# Patient Record
Sex: Male | Born: 1940
Health system: Southern US, Community
[De-identification: ages and names within clinical notes are randomized; demographics above are authoritative.]

## PROBLEM LIST (undated history)

## (undated) DIAGNOSIS — E7800 Pure hypercholesterolemia, unspecified: Secondary | ICD-10-CM

## (undated) DIAGNOSIS — C801 Malignant (primary) neoplasm, unspecified: Secondary | ICD-10-CM

## (undated) DIAGNOSIS — M1990 Unspecified osteoarthritis, unspecified site: Secondary | ICD-10-CM

## (undated) DIAGNOSIS — I10 Essential (primary) hypertension: Secondary | ICD-10-CM

## (undated) DIAGNOSIS — R002 Palpitations: Secondary | ICD-10-CM

## (undated) DIAGNOSIS — I499 Cardiac arrhythmia, unspecified: Secondary | ICD-10-CM

## (undated) DIAGNOSIS — Z87442 Personal history of urinary calculi: Secondary | ICD-10-CM

## (undated) DIAGNOSIS — G4730 Sleep apnea, unspecified: Secondary | ICD-10-CM

## (undated) DIAGNOSIS — K219 Gastro-esophageal reflux disease without esophagitis: Secondary | ICD-10-CM

## (undated) DIAGNOSIS — Z9289 Personal history of other medical treatment: Secondary | ICD-10-CM

## (undated) HISTORY — PX: EYE SURGERY: SHX253

## (undated) HISTORY — PX: RETINAL DETACHMENT SURGERY: SHX105

## (undated) HISTORY — PX: TONSILLECTOMY: SUR1361

## (undated) HISTORY — PX: MOHS SURGERY: SHX181

## (undated) HISTORY — DX: Gastro-esophageal reflux disease without esophagitis: K21.9

## (undated) HISTORY — DX: Palpitations: R00.2

## (undated) HISTORY — DX: Personal history of other medical treatment: Z92.89

---

## 2005-05-04 ENCOUNTER — Encounter: Admission: RE | Admit: 2005-05-04 | Discharge: 2005-05-04 | Payer: Self-pay | Admitting: Internal Medicine

## 2005-05-09 ENCOUNTER — Encounter: Admission: RE | Admit: 2005-05-09 | Discharge: 2005-05-09 | Payer: Self-pay | Admitting: Internal Medicine

## 2005-06-07 ENCOUNTER — Other Ambulatory Visit: Admission: RE | Admit: 2005-06-07 | Discharge: 2005-06-07 | Payer: Self-pay | Admitting: Interventional Radiology

## 2005-06-07 ENCOUNTER — Encounter: Admission: RE | Admit: 2005-06-07 | Discharge: 2005-06-07 | Payer: Self-pay | Admitting: Internal Medicine

## 2010-04-24 ENCOUNTER — Encounter: Payer: Self-pay | Admitting: Internal Medicine

## 2010-12-03 DIAGNOSIS — Z9289 Personal history of other medical treatment: Secondary | ICD-10-CM

## 2010-12-03 HISTORY — DX: Personal history of other medical treatment: Z92.89

## 2011-06-03 ENCOUNTER — Emergency Department (HOSPITAL_COMMUNITY): Payer: Medicare Other

## 2011-06-03 ENCOUNTER — Encounter (HOSPITAL_COMMUNITY): Payer: Self-pay | Admitting: Family Medicine

## 2011-06-03 ENCOUNTER — Inpatient Hospital Stay (HOSPITAL_COMMUNITY)
Admission: EM | Admit: 2011-06-03 | Discharge: 2011-06-04 | DRG: 552 | Disposition: A | Discharge status: Home / Self Care | Payer: Medicare Other | Attending: Neurosurgery | Admitting: Neurosurgery

## 2011-06-03 DIAGNOSIS — S060XAA Concussion with loss of consciousness status unknown, initial encounter: Secondary | ICD-10-CM | POA: Diagnosis present

## 2011-06-03 DIAGNOSIS — S060X9A Concussion with loss of consciousness of unspecified duration, initial encounter: Secondary | ICD-10-CM | POA: Diagnosis present

## 2011-06-03 DIAGNOSIS — S12100A Unspecified displaced fracture of second cervical vertebra, initial encounter for closed fracture: Principal | ICD-10-CM | POA: Diagnosis present

## 2011-06-03 DIAGNOSIS — Z7982 Long term (current) use of aspirin: Secondary | ICD-10-CM

## 2011-06-03 DIAGNOSIS — W208XXA Other cause of strike by thrown, projected or falling object, initial encounter: Secondary | ICD-10-CM | POA: Diagnosis present

## 2011-06-03 DIAGNOSIS — S0101XA Laceration without foreign body of scalp, initial encounter: Secondary | ICD-10-CM

## 2011-06-03 DIAGNOSIS — S129XXA Fracture of neck, unspecified, initial encounter: Secondary | ICD-10-CM

## 2011-06-03 DIAGNOSIS — S0100XA Unspecified open wound of scalp, initial encounter: Secondary | ICD-10-CM

## 2011-06-03 DIAGNOSIS — S0990XA Unspecified injury of head, initial encounter: Secondary | ICD-10-CM

## 2011-06-03 HISTORY — DX: Unspecified displaced fracture of second cervical vertebra, initial encounter for closed fracture: S12.100A

## 2011-06-03 HISTORY — DX: Pure hypercholesterolemia, unspecified: E78.00

## 2011-06-03 HISTORY — DX: Sleep apnea, unspecified: G47.30

## 2011-06-03 HISTORY — DX: Essential (primary) hypertension: I10

## 2011-06-03 LAB — COMPREHENSIVE METABOLIC PANEL
AST: 30 U/L (ref 0–37)
Albumin: 3.9 g/dL (ref 3.5–5.2)
BUN: 16 mg/dL (ref 6–23)
Chloride: 101 mEq/L (ref 96–112)
GFR calc Af Amer: >90 mL/min (ref >90)
Glucose, Bld: 117 mg/dL — ABNORMAL HIGH (ref 70–99)
Sodium: 139 mEq/L (ref 135–145)

## 2011-06-03 LAB — URINALYSIS, MICROSCOPIC ONLY
Hgb urine dipstick: NEGATIVE
Leukocytes, UA: NEGATIVE
Nitrite: NEGATIVE
Urobilinogen, UA: 1 mg/dL (ref 0.0–1.0)

## 2011-06-03 LAB — POCT I-STAT, CHEM 8
BUN: 20 mg/dL (ref 6–23)
Hemoglobin: 14.3 g/dL (ref 13.0–17.0)
Potassium: 3.1 mEq/L — ABNORMAL LOW (ref 3.5–5.1)
Sodium: 143 mEq/L (ref 135–145)
TCO2: 30 mmol/L (ref 0–100)

## 2011-06-03 LAB — PROTIME-INR
INR: 0.99 (ref 0.00–1.49)
Prothrombin Time: 13.3 seconds (ref 11.6–15.2)

## 2011-06-03 LAB — SAMPLE TO BLOOD BANK

## 2011-06-03 LAB — LACTIC ACID, PLASMA: Lactic Acid, Venous: 2.6 mmol/L — ABNORMAL HIGH (ref 0.5–2.2)

## 2011-06-03 LAB — CBC
HCT: 40.9 % (ref 39.0–52.0)
Hemoglobin: 14.4 g/dL (ref 13.0–17.0)
Platelets: 228 10*3/uL (ref 150–400)
RDW: 13 % (ref 11.5–15.5)
WBC: 16.7 10*3/uL — ABNORMAL HIGH (ref 4.0–10.5)

## 2011-06-03 MED ORDER — ONDANSETRON HCL 4 MG/2ML IJ SOLN
INTRAMUSCULAR | Status: AC
  Administered 2011-06-03: 4 mg via INTRAVENOUS
  Filled 2011-06-03: qty 2

## 2011-06-03 MED ORDER — ONDANSETRON HCL 4 MG/2ML IJ SOLN
4.0000 mg | Freq: Once | INTRAMUSCULAR | Status: AC
  Administered 2011-06-03: 4 mg via INTRAVENOUS
  Filled 2011-06-03: qty 2

## 2011-06-03 MED ORDER — MORPHINE SULFATE 2 MG/ML IJ SOLN: 1.0000 mg | INTRAMUSCULAR | Status: DC | PRN

## 2011-06-03 MED ORDER — HYDROCHLOROTHIAZIDE 25 MG PO TABS
25.0000 mg | ORAL_TABLET | Freq: Every day | ORAL | Status: DC
  Administered 2011-06-04: 25 mg via ORAL
  Filled 2011-06-03 (×2): qty 1

## 2011-06-03 MED ORDER — TETANUS-DIPHTH-ACELL PERTUSSIS 5-2.5-18.5 LF-MCG/0.5 IM SUSP
0.5000 mL | Freq: Once | INTRAMUSCULAR | Status: AC
  Administered 2011-06-03: 0.5 mL via INTRAMUSCULAR
  Filled 2011-06-03: qty 0.5

## 2011-06-03 MED ORDER — BACITRACIN ZINC 500 UNIT/GM EX OINT
TOPICAL_OINTMENT | Freq: Two times a day (BID) | CUTANEOUS | Status: DC
  Administered 2011-06-03 – 2011-06-04 (×2): via TOPICAL
  Filled 2011-06-03 (×2): qty 15

## 2011-06-03 MED ORDER — IOHEXOL 350 MG/ML SOLN
50.0000 mL | Freq: Once | INTRAVENOUS | Status: AC | PRN
  Administered 2011-06-03: 50 mL via INTRAVENOUS

## 2011-06-03 MED ORDER — OXYCODONE HCL 5 MG PO TABS: 2.5000 mg | ORAL_TABLET | ORAL | Status: DC | PRN

## 2011-06-03 MED ORDER — POTASSIUM CHLORIDE IN NACL 20-0.9 MEQ/L-% IV SOLN
INTRAVENOUS | Status: DC
  Administered 2011-06-03: 21:00:00 via INTRAVENOUS
  Filled 2011-06-03 (×2): qty 1000

## 2011-06-03 MED ORDER — SODIUM CHLORIDE 0.9 % IV BOLUS (SEPSIS)
1000.0000 mL | Freq: Once | INTRAVENOUS | Status: AC
  Administered 2011-06-03: 1000 mL via INTRAVENOUS

## 2011-06-03 MED ORDER — ONDANSETRON HCL 4 MG PO TABS: 4.0000 mg | ORAL_TABLET | Freq: Four times a day (QID) | ORAL | Status: DC | PRN

## 2011-06-03 MED ORDER — ASPIRIN 81 MG PO CHEW
81.0000 mg | CHEWABLE_TABLET | Freq: Every day | ORAL | Status: DC
  Administered 2011-06-04: 81 mg via ORAL
  Filled 2011-06-03 (×2): qty 1

## 2011-06-03 MED ORDER — NEBIVOLOL HCL 5 MG PO TABS
5.0000 mg | ORAL_TABLET | Freq: Every day | ORAL | Status: DC
  Administered 2011-06-04: 5 mg via ORAL
  Filled 2011-06-03 (×2): qty 1

## 2011-06-03 MED ORDER — ONDANSETRON HCL 4 MG/2ML IJ SOLN: 4.0000 mg | Freq: Four times a day (QID) | INTRAMUSCULAR | Status: DC | PRN

## 2011-06-03 MED ORDER — FENTANYL CITRATE 0.05 MG/ML IJ SOLN
50.0000 ug | Freq: Once | INTRAMUSCULAR | Status: DC
  Filled 2011-06-03: qty 2

## 2011-06-03 MED ORDER — VITAMIN D (ERGOCALCIFEROL) 1.25 MG (50000 UNIT) PO CAPS
50000.0000 [IU] | ORAL_CAPSULE | ORAL | Status: DC
  Filled 2011-06-03: qty 1

## 2011-06-03 MED ORDER — LISINOPRIL 40 MG PO TABS
40.0000 mg | ORAL_TABLET | Freq: Every day | ORAL | Status: DC
  Administered 2011-06-04: 40 mg via ORAL
  Filled 2011-06-03 (×2): qty 1

## 2011-06-03 MED ORDER — CEFAZOLIN SODIUM 1-5 GM-% IV SOLN
INTRAVENOUS | Status: AC
  Filled 2011-06-03: qty 50

## 2011-06-03 MED ORDER — CEFAZOLIN SODIUM 1-5 GM-% IV SOLN
1.0000 g | Freq: Three times a day (TID) | INTRAVENOUS | Status: DC
  Administered 2011-06-04 (×2): 1 g via INTRAVENOUS
  Filled 2011-06-03 (×3): qty 50

## 2011-06-03 MED ORDER — HYDROCODONE-ACETAMINOPHEN 5-325 MG PO TABS: 2.0000 | ORAL_TABLET | ORAL | Status: DC | PRN

## 2011-06-03 MED ORDER — CEFAZOLIN SODIUM 1-5 GM-% IV SOLN
1.0000 g | Freq: Once | INTRAVENOUS | Status: AC
  Administered 2011-06-03: 1 g via INTRAVENOUS

## 2011-06-03 MED ORDER — SIMVASTATIN 20 MG PO TABS
20.0000 mg | ORAL_TABLET | Freq: Every evening | ORAL | Status: DC
  Administered 2011-06-03: 20 mg via ORAL
  Filled 2011-06-03 (×2): qty 1

## 2011-06-03 MED ORDER — AMLODIPINE BESYLATE 10 MG PO TABS
10.0000 mg | ORAL_TABLET | Freq: Every day | ORAL | Status: DC
  Administered 2011-06-04: 10 mg via ORAL
  Filled 2011-06-03 (×2): qty 1
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
## 2011-06-03 NOTE — ED Notes (Signed)
Pt contact Ronaldo Miyamoto [phone] son  Amada Jupiter- [phone]
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
## 2011-06-03 NOTE — Op Note (Signed)
NAMEMarland Kitchen  Palmer, Mario NO.:  [id number]  MEDICAL RECORD NO.:  [id number]  LOCATION:  3032                         FACILITY:  [facility]  PHYSICIAN:  Abigail Miyamoto, M.D. DATE OF BIRTH:  1940-11-15  DATE OF PROCEDURE:  06/03/2011 DATE OF DISCHARGE:                              OPERATIVE REPORT   PREOPERATIVE DIAGNOSIS:  A 12-cm left scalp laceration, 6-cm right scalp laceration.  POSTOPERATIVE DIAGNOSIS:  A 12-cm left scalp laceration, 6-cm right scalp laceration.  PROCEDURE:  Complex repair to bilateral scalp laceration.  SURGEON:  Abigail Miyamoto, M.D.  ANESTHESIA:  Lidocaine 1% with epinephrine.  ESTIMATED BLOOD LOSS:  Minimal.  INDICATIONS:  This is a 71 year old gentleman who was hit on the head by a tree.  He has large scalp lacerations on both sides.  He also has a C2 fracture.  The decision was made by Neurosurgery to conservatively manage the C-spine fracture, so decision was made to repair the scalp lacerations in the emergency department.  PROCEDURE IN DETAIL:  The patient's head was prepped and draped in usual sterile fashion.  I excised some of the hair around both lacerations with the scissors.  I then irrigated both wounds completely with normal saline and cleaned them vigorously with Betadine.  I then closed the subcutaneous tissue of both incisions with interrupted 3-0 Vicryl sutures.  I then closed both incisions with skin staples.  The laceration on the left side did go on to the forehead slightly and repaired this with 3-0 and 4-0 Prolene sutures.  The patient tolerated the procedure well.  Towels and tape were then applied.     Abigail Miyamoto, M.D.     DB/MEDQ  D:  06/03/2011  T:  06/03/2011  Job:  [id number]

## 2011-06-03 NOTE — H&P (Signed)
Mario Palmer is an 71 y.o. male.   Chief Complaint: C2 fracture HPI: 71 yo gentleman whom while cutting trees on his property had a tree limb hit him on the head. He walked on his own until his son caught up with him. His son put him in his truck and took him closer to his home. EMS then arrived placed him on A back board and cervical collar. Transported to [facility] ER. Normal neurologic exam at the hospital. Head CT normal, cspine CT showed a C2 fracture. Cervical alignment maintained. Trauma to see also.  Past Medical History  Diagnosis Date  . Hypertension   . High cholesterol   . Sleep apnea     History reviewed. No pertinent past surgical history.  History reviewed. No pertinent family history. Social History:  does not have a smoking history on file. He does not have any smokeless tobacco history on file. He reports that he does not drink alcohol or use illicit drugs.  Allergies: No Known Allergies  Medications Prior to Admission  Medication Dose Route Frequency Provider Last Rate Last Dose  . ceFAZolin (ANCEF) IVPB 1 g/50 mL premix  1 g Intravenous Once Shelly Rubenstein, MD   1 g at 06/03/11 1848  . fentaNYL (SUBLIMAZE) injection 50 mcg  50 mcg Intravenous Once Amy Coker, MD      . ondansetron (ZOFRAN) injection 4 mg  4 mg Intravenous Once Particia Lather, MD   4 mg at 06/03/11 1814  . sodium chloride 0.9 % bolus 1,000 mL  1,000 mL Intravenous Once Particia Lather, MD   1,000 mL at 06/03/11 1811  . TDaP (BOOSTRIX) injection 0.5 mL  0.5 mL Intramuscular Once Particia Lather, MD   0.5 mL at 06/03/11 1802   No current outpatient prescriptions on file as of 06/03/2011.    Results for orders placed during the hospital encounter of 06/03/11 (from the past 48 hour(s))  COMPREHENSIVE METABOLIC PANEL     Status: Abnormal   Collection Time   06/03/11  4:27 PM      Component Value Range Comment   Sodium 139  135 - 145 (mEq/L)    Potassium 3.5  3.5 - 5.1 (mEq/L)    Chloride 101  96 - 112 (mEq/L)    CO2  24  19 - 32 (mEq/L)    Glucose, Bld 117 (*) 70 - 99 (mg/dL)    BUN 16  6 - 23 (mg/dL)    Creatinine, Ser 1.61  0.50 - 1.35 (mg/dL)    Calcium 9.4  8.4 - 10.5 (mg/dL)    Total Protein 6.9  6.0 - 8.3 (g/dL)    Albumin 3.9  3.5 - 5.2 (g/dL)    AST 30  0 - 37 (U/L) HEMOLYSIS AT THIS LEVEL MAY AFFECT RESULT   ALT 23  0 - 53 (U/L)    Alkaline Phosphatase 59  39 - 117 (U/L)    Total Bilirubin 0.8  0.3 - 1.2 (mg/dL)    GFR calc non Af Amer 88 (*) >90 (mL/min)    GFR calc Af Amer >90  >90 (mL/min)   CBC     Status: Abnormal   Collection Time   06/03/11  4:27 PM      Component Value Range Comment   WBC 16.7 (*) 4.0 - 10.5 (K/uL)    RBC 4.68  4.22 - 5.81 (MIL/uL)    Hemoglobin 14.4  13.0 - 17.0 (g/dL)    HCT 09.6  04.5 - 40.9 (%)  MCV 87.4  78.0 - 100.0 (fL)    MCH 30.8  26.0 - 34.0 (pg)    MCHC 35.2  30.0 - 36.0 (g/dL)    RDW 16.1  09.6 - 04.5 (%)    Platelets 228  150 - 400 (K/uL)   PROTIME-INR     Status: Normal   Collection Time   06/03/11  4:27 PM      Component Value Range Comment   Prothrombin Time 13.3  11.6 - 15.2 (seconds)    INR 0.99  0.00 - 1.49    LACTIC ACID, PLASMA     Status: Abnormal   Collection Time   06/03/11  4:28 PM      Component Value Range Comment   Lactic Acid, Venous 2.6 (*) 0.5 - 2.2 (mmol/L)   SAMPLE TO BLOOD BANK     Status: Normal   Collection Time   06/03/11  4:28 PM      Component Value Range Comment   Blood Bank Specimen SAMPLE AVAILABLE FOR TESTING      Sample Expiration 06/04/2011     POCT I-STAT, CHEM 8     Status: Abnormal   Collection Time   06/03/11  4:46 PM      Component Value Range Comment   Sodium 143  135 - 145 (mEq/L)    Potassium 3.1 (*) 3.5 - 5.1 (mEq/L)    Chloride 103  96 - 112 (mEq/L)    BUN 20  6 - 23 (mg/dL)    Creatinine, Ser 4.09  0.50 - 1.35 (mg/dL)    Glucose, Bld 811 (*) 70 - 99 (mg/dL)    Calcium, Ion 9.14  1.12 - 1.32 (mmol/L)    TCO2 30  0 - 100 (mmol/L)    Hemoglobin 14.3  13.0 - 17.0 (g/dL)    HCT 78.2  95.6 - 21.3  (%)   URINALYSIS, WITH MICROSCOPIC     Status: Abnormal   Collection Time   06/03/11  6:16 PM      Component Value Range Comment   Color, Urine YELLOW  YELLOW     APPearance CLEAR  CLEAR     Specific Gravity, Urine 1.013  1.005 - 1.030     pH 7.5  5.0 - 8.0     Glucose, UA NEGATIVE  NEGATIVE (mg/dL)    Hgb urine dipstick NEGATIVE  NEGATIVE     Bilirubin Urine NEGATIVE  NEGATIVE     Ketones, ur 15 (*) NEGATIVE (mg/dL)    Protein, ur NEGATIVE  NEGATIVE (mg/dL)    Urobilinogen, UA 1.0  0.0 - 1.0 (mg/dL)    Nitrite NEGATIVE  NEGATIVE     Leukocytes, UA NEGATIVE  NEGATIVE     Squamous Epithelial / LPF RARE  RARE     Dg Thoracic Spine 2 View  06/03/2011  *RADIOLOGY REPORT*  Clinical Data: A tree limb fell on patient, landed on head. Lacerations of head.  Pain in the head and neck.  THORACIC SPINE - 2 VIEW  Comparison: Chest x-ray 06/03/2011  Findings: There is normal alignment of the thoracic spine.  No evidence for acute fracture or subluxation.  Visualized portion of the sternum appears intact. No evidence for acute rib fracture.  IMPRESSION: Normal alignment of the thoracic spine.  Original Report Authenticated By: Patterson Hammersmith, M.D.   Dg Lumbar Spine 2-3 Views  06/03/2011  *RADIOLOGY REPORT*  Clinical Data: A tree limb fell on the patient's head. Lacerations.  Pain in head and neck.  Cervical  spine fracture.  LUMBAR SPINE - 2-3 VIEW  Comparison: Chest x-ray 06/03/2011  Findings: There is normal alignment of the spine.  No evidence for acute fracture or subluxation.  Mild degenerative changes are present.  IMPRESSION: No evidence for acute  abnormality.  Original Report Authenticated By: Patterson Hammersmith, M.D.   Ct Head Wo Contrast  06/03/2011  *RADIOLOGY REPORT*  Clinical Data:  Tree fell on head.  Laceration on the top of the head.  Neck pain.  CT HEAD WITHOUT CONTRAST CT CERVICAL SPINE WITHOUT CONTRAST  Technique:  Multidetector CT imaging of the head and cervical spine was performed  following the standard protocol without intravenous contrast.  Multiplanar CT image reconstructions of the cervical spine were also generated.  Comparison:   None  CT HEAD  Findings: There is a large scalp laceration extending from the left frontal region to involve the left parietal region.  Extensive scalp edema identified.  No evidence for calvarial fracture.  There is no intra or extra-axial fluid collection or mass lesion. The basilar cisterns and ventricles have a normal appearance. There is no CT evidence for acute infarction or hemorrhage.  IMPRESSION:  1. No evidence for acute intracranial abnormality. 2.  Large left scalp laceration out underlying calvarial fracture.  CT CERVICAL SPINE  Findings: There is a comminuted fracture of T2.  Fracture involves the posterior aspect of the dens and extends vertically to involve the lamina on the right side and the pedicle on the left side.  The vertebral foramina are involved bilaterally at C2.  Fracture is considered unstable.  The spinous process of C2 is posteriorly displaced compared to C3.  There is little soft tissue edema. There are degenerative changes throughout the cervical spine.  No other fractures are identified however.  Lung apices are unremarkable.  IMPRESSION:  1.  Comminuted, unstable fracture of C2. 2.  Significant degenerative changes elsewhere in the cervical spine.  No other fractures.  Critical test results telephoned to [facility] at the time of interpretation on date 06/03/2011 at time 5:21 p.m.  Original Report Authenticated By: Patterson Hammersmith, M.D.   Ct Cervical Spine Wo Contrast  06/03/2011  *RADIOLOGY REPORT*  Clinical Data:  Tree fell on head.  Laceration on the top of the head.  Neck pain.  CT HEAD WITHOUT CONTRAST CT CERVICAL SPINE WITHOUT CONTRAST  Technique:  Multidetector CT imaging of the head and cervical spine was performed following the standard protocol without intravenous contrast.  Multiplanar CT image reconstructions  of the cervical spine were also generated.  Comparison:   None  CT HEAD  Findings: There is a large scalp laceration extending from the left frontal region to involve the left parietal region.  Extensive scalp edema identified.  No evidence for calvarial fracture.  There is no intra or extra-axial fluid collection or mass lesion. The basilar cisterns and ventricles have a normal appearance. There is no CT evidence for acute infarction or hemorrhage.  IMPRESSION:  1. No evidence for acute intracranial abnormality. 2.  Large left scalp laceration out underlying calvarial fracture.  CT CERVICAL SPINE  Findings: There is a comminuted fracture of T2.  Fracture involves the posterior aspect of the dens and extends vertically to involve the lamina on the right side and the pedicle on the left side.  The vertebral foramina are involved bilaterally at C2.  Fracture is considered unstable.  The spinous process of C2 is posteriorly displaced compared to C3.  There is little soft tissue  edema. There are degenerative changes throughout the cervical spine.  No other fractures are identified however.  Lung apices are unremarkable.  IMPRESSION:  1.  Comminuted, unstable fracture of C2. 2.  Significant degenerative changes elsewhere in the cervical spine.  No other fractures.  Critical test results telephoned to [facility] at the time of interpretation on date 06/03/2011 at time 5:21 p.m.  Original Report Authenticated By: Patterson Hammersmith, M.D.   Dg Chest Port 1 View  06/03/2011  *RADIOLOGY REPORT*  Clinical Data: Tree fell on head.  Laceration to head  PORTABLE CHEST - 1 VIEW  Comparison: 05/04/2005  Findings: Cardiac enlargement.  Vascularity is normal.  Lungs are clear without infiltrate or effusion.  No acute bony abnormality.  IMPRESSION: Cardiac enlargement.  No acute cardiopulmonary disease.  Original Report Authenticated By: Camelia Phenes, M.D.   Dg Hand Complete Left  06/03/2011  *RADIOLOGY REPORT*  Clinical Data:  Injury.  Lacerations of the hand.  LEFT HAND - COMPLETE 3+ VIEW  Comparison: None.  Findings: There is no evidence for acute fracture or dislocation. No soft tissue foreign body or gas identified.  There is soft tissue swelling along the dorsum of the hand at the mid carpal phalangeal joints.  IMPRESSION:  1.  Soft tissue swelling. 2. No evidence for acute osseous abnormality.  Original Report Authenticated By: Patterson Hammersmith, M.D.    Review of Systems  Constitutional: Negative.   HENT: Positive for neck pain.   Eyes: Negative.   Respiratory:       Sleep apnea  Cardiovascular: Negative.   Gastrointestinal: Negative.   Genitourinary: Negative.   Skin: Negative.   Neurological: Negative.   Endo/Heme/Allergies: Negative.   Psychiatric/Behavioral: Negative.     Blood pressure 108/63, pulse 75, temperature 97.7 F (36.5 C), resp. rate 18, SpO2 99.00%. Physical Exam  Constitutional: He is oriented to person, place, and time. He appears well-developed and well-nourished.  HENT:  Head: Normocephalic. Head is with laceration. Head is without raccoon's eyes, without Battle's sign, without right periorbital erythema and without left periorbital erythema.    Right Ear: Tympanic membrane, external ear and ear canal normal. No hemotympanum.  Left Ear: Tympanic membrane, external ear and ear canal normal.  Nose: Nose normal.  Mouth/Throat: Uvula is midline and oropharynx is clear and moist. Mucous membranes are not pale, dry and not cyanotic.         Two scalp lacerations. Repaired primarily.  Neck: Trachea normal and phonation normal. Normal carotid pulses present. Carotid bruit is not present. No tracheal deviation present.  GI: Soft. Normal appearance and bowel sounds are normal.  Musculoskeletal: Normal range of motion.       Left hand: He exhibits laceration.  Neurological: He is alert and oriented to person, place, and time. He has normal strength and normal reflexes. He displays no  atrophy. No cranial nerve deficit or sensory deficit. He exhibits normal muscle tone. Coordination normal. GCS eye subscore is 4. GCS verbal subscore is 5. GCS motor subscore is 6. He displays no Babinski's sign on the right side. He displays no Babinski's sign on the left side.  Reflex Scores:      Tricep reflexes are 2+ on the right side and 2+ on the left side.      Bicep reflexes are 2+ on the right side and 2+ on the left side.      Brachioradialis reflexes are 2+ on the right side and 2+ on the left side.  Patellar reflexes are 2+ on the right side and 2+ on the left side.      Achilles reflexes are 2+ on the right side and 2+ on the left side.      Amnestic for some parts of the event.     Assessment/Plan 71 yo with concussion now completely resolved, and C2 fracture with good cervical alignment. Will need to stay in [employer].  Admit overnight. Undergoing Ct angio to assess vertebral artery. No evidence on exam of any problem in posterior cerebral territory. Admit to floor. Doing well  Cassady Turano L 06/03/2011, 7:17 PM

## 2011-06-03 NOTE — ED Provider Notes (Signed)
6:21 PM  I performed a history and physical examination of Raju Coppolino Bonafede and discussed his management with Dr. Lendell Caprice.  I agree with the history, physical, assessment, and plan of care, with the following exceptions: None  This patient was seen by me along with Dr. Lendell Caprice on initial evaluation in the ED. The patient apparently had a large branch fall from a tree landing on his head. He has a significant laceration to the scalp, and a headache with neck pain. He is neurologically intact distal to his injuries, with movement, sensation, and strength appropriate in all 4 extremities. He denies any other injuries.  The patient was found to have an unstable, comminuted C2 vertebral fracture on CT scan. There is no evidence of closed head injury. Dr. Lendell Caprice has communicated the details of the case to Dr. Mikal Plane of neurosurgery on call. Dr. Mikal Plane advises that he perceives no surgical intervention to be needed, and that the patient should be discharged home in a cervical spine collar. Dr. Mikal Plane is coming to evaluate the patient. If the patient is discharged home, it will be done by Dr. Mikal Plane after his evaluation. Trauma surgery, Dr. Magnus Ivan was contacted after the conversation with Dr. Mikal Plane and informed that Dr. Mikal Plane would not be admitting the patient and recommended discharge home. Dr. Magnus Ivan states that this should be neurosurgery admission if it needs admission, and that Dr. Mikal Plane advises discharge that they do not see a reason to admit the patient otherwise.  I was present for the following procedures: None Time Spent in Critical Care of the patient: None Time spent in discussions with the patient and family: 15 minutes  Merlyn Bollen D    Felisa Bonier, MD 06/03/11 1825

## 2011-06-03 NOTE — ED Notes (Signed)
Pt transported to CT ?

## 2011-06-03 NOTE — Progress Notes (Signed)
Patient Mario Palmer, 71 year old [place] male arrived via EMS at the ED trauma room 17 in late afternoon.  Patient is undergoing several tests and CT scans.  Patient's family expressed appreciation for Chaplain's provision of pastoral presence and conversation.  I will follow-up as needed.

## 2011-06-04 ENCOUNTER — Telehealth (HOSPITAL_COMMUNITY): Payer: Self-pay

## 2011-06-04 MED ORDER — TRAMADOL HCL 50 MG PO TABS: 50.0000 mg | ORAL_TABLET | Freq: Four times a day (QID) | ORAL | Status: AC | PRN

## 2011-06-04 MED ORDER — BACITRACIN ZINC 500 UNIT/GM EX OINT: TOPICAL_OINTMENT | Freq: Two times a day (BID) | CUTANEOUS | Status: AC

## 2011-06-04 MED ORDER — WHITE PETROLATUM GEL
Status: AC
  Administered 2011-06-04
  Filled 2011-06-04: qty 5

## 2011-06-04 NOTE — Progress Notes (Signed)
Staple removal per Neurosurgery at follow-up, per patient.   Being discharged today by Neurosurgery.  Wilmon Arms. Corliss Skains, MD, [facility] Surgery  06/04/2011 11:55 AM

## 2011-06-04 NOTE — Discharge Summary (Signed)
Physician Discharge Summary  Patient ID: Mario Palmer MRN: [id number] DOB/AGE: 1940-04-07 71 y.o.  Admit date: 06/03/2011 Discharge date: 06/04/2011  Admission Diagnoses:C2 fracture  Discharge Diagnoses:  Principal Problem:  *Traumatic closed fracture of C2 vertebra with minimal displacement   Discharged Condition: good  Hospital Course: admitted for observation. [name] sustained a C2 fracture and deep scalp lacerations after being struck by a tree limb. He had no neurologic deficits on motor exam. He did have a concussion. Head CT was normal.   Consults: general surgery  Significant Diagnostic Studies: angiography: CT of Cspine and vertebral arteries showed no arterial injuries. There is a mass in the vallecula which I will refer him to an ENT physician for evaluation.  Treatments: Scalp lacerations repaired primarily by Trauma service.  Discharge Exam: Blood pressure 122/70, pulse 78, temperature 99 F (37.2 C), temperature source Oral, resp. rate 18, SpO2 97.00%. normal exam.  Disposition: Final discharge disposition not confirmed   Medication List  As of 06/04/2011 10:11 AM   TAKE these medications         amLODipine 10 MG tablet   Commonly known as: NORVASC   Take 10 mg by mouth daily.      aspirin 81 MG chewable tablet   Chew 81 mg by mouth daily.      bacitracin ointment   Apply topically 2 (two) times daily. To hand      hydrochlorothiazide 25 MG tablet   Commonly known as: HYDRODIURIL   Take 25 mg by mouth daily.      lisinopril 40 MG tablet   Commonly known as: PRINIVIL,ZESTRIL   Take 40 mg by mouth daily.      nebivolol 5 MG tablet   Commonly known as: BYSTOLIC   Take 5 mg by mouth daily.      simvastatin 20 MG tablet   Commonly known as: ZOCOR   Take 20 mg by mouth every evening.      traMADol 50 MG tablet   Commonly known as: ULTRAM   Take 1 tablet (50 mg total) by mouth every 6 (six) hours as needed for pain.      Vitamin D  (Ergocalciferol) 50000 UNITS Caps   Commonly known as: DRISDOL   Take 50,000 Units by mouth 2 (two) times a week. Takes on Tuesday and thursday           Follow-up Information    Follow up with [name], MD in 2 weeks. (call to make appt.)    Contact information:   [address], [address] [place] [address] [phone]          Signed: Carmela Hurt 06/04/2011, 10:11 AM

## 2011-06-04 NOTE — Progress Notes (Signed)
Subjective: Pt ok. Feeling well. NO new c/o No CP, SOB, Abd pain, N/V Ate breakfast. Mild headache.  Objective: Vital signs in last 24 hours: Temp:  [97.7 F (36.5 C)-99 F (37.2 C)] 99 F (37.2 C) (03/03 0956) Pulse Rate:  [70-82] 78  (03/03 0956) Resp:  [16-20] 18  (03/03 0956) BP: (108-134)/(62-79) 122/70 mmHg (03/03 0956) SpO2:  [92 %-100 %] 97 % (03/03 0956)    Intake/Output this shift:    Physical Exam: BP 122/70  Pulse 78  Temp(Src) 99 F (37.2 C) (Oral)  Resp 18  SpO2 97% [place] l[place] look OK, abx oint in place. Lungs: CTA Abdomen: soft, NT  Labs: CBC  Basename 06/03/11 1646 06/03/11 1627  WBC -- 16.7*  HGB 14.3 14.4  HCT 42.0 40.9  PLT -- 228   BMET  Basename 06/03/11 1646 06/03/11 1627  NA 143 139  K 3.1* 3.5  CL 103 101  CO2 -- 24  GLUCOSE 117* 117*  BUN 20 16  CREATININE 1.00 0.79  CALCIUM -- 9.4   LFT  Basename 06/03/11 1627  PROT 6.9  ALBUMIN 3.9  AST 30  ALT 23  ALKPHOS 59  BILITOT 0.8  BILIDIR --  IBILI --  LIPASE --   PT/INR  Basename 06/03/11 1627  LABPROT 13.3  INR 0.99   ABG No results found for this basename: PHART:2,PCO2:2,PO2:2,HCO3:2 in the last 72 hours  Studies/Results: Dg Thoracic Spine 2 View  06/03/2011  *RADIOLOGY REPORT*  Clinical Data: A tree limb fell on patient, landed on head. Lacerations of head.  Pain in the head and neck.  THORACIC SPINE - 2 VIEW  Comparison: Chest x-ray 06/03/2011  Findings: There is normal alignment of the thoracic spine.  No evidence for acute fracture or subluxation.  Visualized portion of the sternum appears intact. No evidence for acute rib fracture.  IMPRESSION: Normal alignment of the thoracic spine.  Original Report Authenticated By: Patterson Hammersmith, M.D.   Dg Lumbar Spine 2-3 Views  06/03/2011  *RADIOLOGY REPORT*  Clinical Data: A tree limb fell on the patient's head. Lacerations.  Pain in head and neck.  Cervical spine fracture.  LUMBAR SPINE - 2-3 VIEW  Comparison:  Chest x-ray 06/03/2011  Findings: There is normal alignment of the spine.  No evidence for acute fracture or subluxation.  Mild degenerative changes are present.  IMPRESSION: No evidence for acute  abnormality.  Original Report Authenticated By: Patterson Hammersmith, M.D.   Ct Head Wo Contrast  06/03/2011  *RADIOLOGY REPORT*  Clinical Data:  Tree fell on head.  Laceration on the top of the head.  Neck pain.  CT HEAD WITHOUT CONTRAST CT CERVICAL SPINE WITHOUT CONTRAST  Technique:  Multidetector CT imaging of the head and cervical spine was performed following the standard protocol without intravenous contrast.  Multiplanar CT image reconstructions of the cervical spine were also generated.  Comparison:   None  CT HEAD  Findings: There is a large scalp laceration extending from the left frontal region to involve the left parietal region.  Extensive scalp edema identified.  No evidence for calvarial fracture.  There is no intra or extra-axial fluid collection or mass lesion. The basilar cisterns and ventricles have a normal appearance. There is no CT evidence for acute infarction or hemorrhage.  IMPRESSION:  1. No evidence for acute intracranial abnormality. 2.  Large left scalp laceration out underlying calvarial fracture.  CT CERVICAL SPINE  Findings: There is a comminuted fracture of T2.  Fracture involves  the posterior aspect of the dens and extends vertically to involve the lamina on the right side and the pedicle on the left side.  The vertebral foramina are involved bilaterally at C2.  Fracture is considered unstable.  The spinous process of C2 is posteriorly displaced compared to C3.  There is little soft tissue edema. There are degenerative changes throughout the cervical spine.  No other fractures are identified however.  Lung apices are unremarkable.  IMPRESSION:  1.  Comminuted, unstable fracture of C2. 2.  Significant degenerative changes elsewhere in the cervical spine.  No other fractures.  Critical test  results telephoned to [facility] at the time of interpretation on date 06/03/2011 at time 5:21 p.m.  Original Report Authenticated By: Patterson Hammersmith, M.D.   Ct Angio Neck W/cm &/or Wo/cm  06/03/2011  *RADIOLOGY REPORT*  Clinical Data:  Tree fell on head.  C2 fracture.  Rule out vertebral artery injury  CT ANGIOGRAPHY NECK  Technique:  Multidetector CT imaging of the neck was performed using the standard protocol during bolus administration of intravenous contrast.  Multiplanar CT image reconstructions including MIPs were obtained to evaluate the vascular anatomy. Carotid stenosis measurements (when applicable) are obtained utilizing NASCET criteria, using the distal internal carotid diameter as the denominator.  Contrast: 50mL OMNIPAQUE IOHEXOL 350 MG/ML IV SOLN  Comparison:  CT cervical spine 06/03/2011  Findings:  Fracture of C2 again noted.  2 cm nodule left lower thyroid.  Ultrasound of the thyroid and biopsy is suggested if clinically appropriate.  No adenopathy in the neck.  Carotid artery is widely patent bilaterally.  No significant atherosclerotic disease and node dissection is present.  Both vertebral arteries are widely patent to the basilar.  No significant vertebral artery stenosis or dissection.  No evidence of injury related to the C2 fracture.  Cystic mass in the vallecula on the right measuring 15 x 23 mm. This appears to be a benign cyst.  However, direct visualization is suggested to two exclude a  mucosal neoplasm.   Review of the MIP images confirms the above findings.  IMPRESSION: No evidence of carotid or vertebral artery injury.  2 cm left thyroid nodule.  Recommend biopsy if clinically appropriate.  Cystic mass in the vallecula on the right.  Recommend direct visualization.  Original Report Authenticated By: Camelia Phenes, M.D.   Ct Cervical Spine Wo Contrast  06/03/2011  *RADIOLOGY REPORT*  Clinical Data:  Tree fell on head.  Laceration on the top of the head.  Neck pain.  CT HEAD  WITHOUT CONTRAST CT CERVICAL SPINE WITHOUT CONTRAST  Technique:  Multidetector CT imaging of the head and cervical spine was performed following the standard protocol without intravenous contrast.  Multiplanar CT image reconstructions of the cervical spine were also generated.  Comparison:   None  CT HEAD  Findings: There is a large scalp laceration extending from the left frontal region to involve the left parietal region.  Extensive scalp edema identified.  No evidence for calvarial fracture.  There is no intra or extra-axial fluid collection or mass lesion. The basilar cisterns and ventricles have a normal appearance. There is no CT evidence for acute infarction or hemorrhage.  IMPRESSION:  1. No evidence for acute intracranial abnormality. 2.  Large left scalp laceration out underlying calvarial fracture.  CT CERVICAL SPINE  Findings: There is a comminuted fracture of T2.  Fracture involves the posterior aspect of the dens and extends vertically to involve the lamina on the right side  and the pedicle on the left side.  The vertebral foramina are involved bilaterally at C2.  Fracture is considered unstable.  The spinous process of C2 is posteriorly displaced compared to C3.  There is little soft tissue edema. There are degenerative changes throughout the cervical spine.  No other fractures are identified however.  Lung apices are unremarkable.  IMPRESSION:  1.  Comminuted, unstable fracture of C2. 2.  Significant degenerative changes elsewhere in the cervical spine.  No other fractures.  Critical test results telephoned to [facility] at the time of interpretation on date 06/03/2011 at time 5:21 p.m.  Original Report Authenticated By: Patterson Hammersmith, M.D.   Dg Chest Port 1 View  06/03/2011  *RADIOLOGY REPORT*  Clinical Data: Tree fell on head.  Laceration to head  PORTABLE CHEST - 1 VIEW  Comparison: 05/04/2005  Findings: Cardiac enlargement.  Vascularity is normal.  Lungs are clear without infiltrate or  effusion.  No acute bony abnormality.  IMPRESSION: Cardiac enlargement.  No acute cardiopulmonary disease.  Original Report Authenticated By: Camelia Phenes, M.D.   Dg Hand Complete Left  06/03/2011  *RADIOLOGY REPORT*  Clinical Data: Injury.  Lacerations of the hand.  LEFT HAND - COMPLETE 3+ VIEW  Comparison: None.  Findings: There is no evidence for acute fracture or dislocation. No soft tissue foreign body or gas identified.  There is soft tissue swelling along the dorsum of the hand at the mid carpal phalangeal joints.  IMPRESSION:  1.  Soft tissue swelling. 2. No evidence for acute osseous abnormality.  Original Report Authenticated By: Patterson Hammersmith, M.D.    Assessment: Principal Problem:  *Traumatic closed fracture of C2 vertebra with minimal displacement Scalp lacerations s/p primary closure with staples.    Plan: Seen by Neuro already, stable for discharge. Pt and RN report neuro will remove staples at follow up.  LOS: 1 day    Mario Palmer 06/04/2011 10:47 AM

## 2011-06-04 NOTE — Progress Notes (Signed)
Discharged to home.  Transported to family car via wheelchair.

## 2011-06-04 NOTE — Progress Notes (Signed)
Orthopedic Tech Progress Note Patient Details:  Mario Palmer 08/22/1940 [id number]  Other Ortho Devices Type of Ortho Device: [place] cervical collar Ortho Device Interventions: Application   [name] 06/04/2011, 12:35 PM

## 2011-06-04 NOTE — Progress Notes (Signed)
Patient continues to have dizziness with position change within the bed. Not tried to get up and move out of bed because of dizziness. Thanks

## 2011-06-07 NOTE — ED Provider Notes (Signed)
Evaluation and management procedures were performed by the resident physician under my supervision/collaboration.    Kacin Dancy D Anu Stagner, MD 06/07/11 2030 

## 2011-08-01 ENCOUNTER — Other Ambulatory Visit: Payer: Self-pay | Admitting: Neurosurgery

## 2011-08-01 DIAGNOSIS — S129XXA Fracture of neck, unspecified, initial encounter: Secondary | ICD-10-CM

## 2011-08-02 ENCOUNTER — Other Ambulatory Visit: Payer: Medicare Other

## 2011-08-04 ENCOUNTER — Other Ambulatory Visit: Payer: Medicare Other

## 2011-08-04 ENCOUNTER — Ambulatory Visit
Admission: RE | Admit: 2011-08-04 | Discharge: 2011-08-04 | Disposition: A | Discharge status: Home / Self Care | Payer: Medicare Other | Source: Ambulatory Visit | Attending: Neurosurgery | Admitting: Neurosurgery

## 2011-08-04 ENCOUNTER — Other Ambulatory Visit: Payer: Self-pay | Admitting: Neurosurgery

## 2011-08-04 DIAGNOSIS — S129XXA Fracture of neck, unspecified, initial encounter: Secondary | ICD-10-CM

## 2013-02-01 ENCOUNTER — Encounter: Payer: Self-pay | Admitting: *Deleted

## 2013-02-04 ENCOUNTER — Ambulatory Visit (INDEPENDENT_AMBULATORY_CARE_PROVIDER_SITE_OTHER): Payer: Medicare Other | Admitting: Cardiovascular Disease

## 2013-02-04 ENCOUNTER — Encounter: Payer: Self-pay | Admitting: Cardiovascular Disease

## 2013-02-04 VITALS — BP 154/88 | HR 57 | Ht 70.0 in | Wt 193.5 lb

## 2013-02-04 DIAGNOSIS — K219 Gastro-esophageal reflux disease without esophagitis: Secondary | ICD-10-CM

## 2013-02-04 DIAGNOSIS — R002 Palpitations: Secondary | ICD-10-CM

## 2013-02-04 DIAGNOSIS — G4733 Obstructive sleep apnea (adult) (pediatric): Secondary | ICD-10-CM

## 2013-02-04 DIAGNOSIS — I10 Essential (primary) hypertension: Secondary | ICD-10-CM

## 2013-02-04 MED ORDER — NEBIVOLOL HCL 5 MG PO TABS: 5.0000 mg | ORAL_TABLET | Freq: Two times a day (BID) | ORAL | Status: DC

## 2013-02-04 NOTE — Patient Instructions (Addendum)
Your physician has recommended you make the following change in your medication: increase bystolic to 5 mg twice daily. This has already been sent to the pharmacy.  Your physician has requested that you have an echocardiogram. Echocardiography is a painless test that uses sound waves to create images of your heart. It provides your doctor with information about the size and shape of your heart and how well your heart's chambers and valves are working. This procedure takes approximately one hour. There are no restrictions for this procedure. This will be done in 2 months.  Your physician recommends that you schedule a follow-up appointment in: 3 months.

## 2013-02-06 ENCOUNTER — Encounter: Payer: Self-pay | Admitting: Cardiovascular Disease

## 2013-02-06 DIAGNOSIS — K219 Gastro-esophageal reflux disease without esophagitis: Secondary | ICD-10-CM | POA: Insufficient documentation

## 2013-02-06 DIAGNOSIS — G4733 Obstructive sleep apnea (adult) (pediatric): Secondary | ICD-10-CM | POA: Insufficient documentation

## 2013-02-06 DIAGNOSIS — I10 Essential (primary) hypertension: Secondary | ICD-10-CM | POA: Insufficient documentation

## 2013-02-06 NOTE — Progress Notes (Signed)
Patient ID: Mario Palmer, male   DOB: 12/18/1940, 72 y.o.   MRN: [id number]     HPI: Mario Palmer is a 72 y.o. male who presents for one-year cardiology evaluation.  Mario Palmer has a history of hypertension, obstructive sleep apnea on CPAP therapy, GERD, palpitations with atrial ectopy,. 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 He previously had been on simvastatin but apparently stopped this one month ago secondary to arthralgias. He has noticed occasional palpitations without frank syncope. He does have a history of prior cervical fracture which is healed. He has been using his CPAP. Recent laboratory done while he was on simvastatin was reviewed and showed a total cholesterol 182 HDL 60 LDL 97 triglycerides 125 and VLDL cholesterol 25. This was laboratory done from 09/09/2012. He presents now for evaluation.  Past Medical History  Diagnosis Date  . Hypertension   . High cholesterol   . Sleep apnea   . GERD (gastroesophageal reflux disease)   . Palpitation     with ecotopy  . Hx of echocardiogram 12/2010    EF 55%, There is Stage 1 diastolic dyfunction. Normal LV filling pressure. Aortic sclerosis with mild AI, mild TR    History reviewed. No pertinent past surgical history.  No Known Allergies  Current Outpatient Prescriptions  Medication Sig Dispense Refill  . amLODipine (NORVASC) 10 MG tablet Take 10 mg by mouth daily.      Marland Kitchen aspirin 81 MG chewable tablet Chew 81 mg by mouth daily.      . Cholecalciferol (VITAMIN D) 2000 UNITS tablet Take 2,000 Units by mouth daily.      . hydrochlorothiazide (HYDRODIURIL) 25 MG tablet Take 25 mg by mouth daily.      Marland Kitchen lisinopril (PRINIVIL,ZESTRIL) 40 MG tablet Take 40 mg by mouth daily.      . nebivolol (BYSTOLIC) 5 MG tablet Take 1 tablet (5 mg total) by mouth 2 (two) times daily.  60 tablet  6   No current facility-administered medications for this visit.    History   Social History  . Marital Status: Married      Spouse Name: N/A    Number of Children: N/A  . Years of Education: N/A   Occupational History  . Not on file.   Social History Main Topics  . Smoking status: Former Smoker    Types: Cigarettes  . Smokeless tobacco: Never Used     Comment: quit smoking in 1982  . Alcohol Use: No  . Drug Use: No  . Sexual Activity: Not on file   Other Topics Concern  . Not on file   Social History Narrative  . No narrative on file    History reviewed. No pertinent family history.  Social history is notable in that he is married. Has 2 children and 2 grandchildren. 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 There is no tobacco or alcohol use.  ROS is negative for fevers, chills or night sweats.  He denies visual symptoms. He denies any recent discomfort in his neck. He status post prior C2 vertebral fracture and required neck brace for 2 months last year. He does note palpitations. There is no chest pressure. He denies cough or wheezing. He denies presyncope or syncope. He denies bleeding denies abdominal pain. Has nausea or vomiting. There is no claudication. He does have an umbilical hernia. He's not diabetic. He denies skin lesions. Other comprehensive 12 point system review is negative.  PE BP 154/88  Pulse 57  Ht 5\' 10"  (1.778 m)  Wt 193 lb 8 oz (87.771 kg)  BMI 27.76 kg/m2  Repeat blood pressure when taken by me 130/70 General: Alert, oriented, no distress.  Skin: normal turgor, no rashes HEENT: Normocephalic, atraumatic. Pupils round and reactive; sclera anicteric;no lid lag.  Nose without nasal septal hypertrophy Mouth/Parynx benign; Mallinpatti scale 3 Neck: No JVD, no carotid briuts Lungs: clear to ausculatation and percussion; no wheezing or rales Heart: RRR, s1 s2 normal 1/6 systolic murmur. Abdomen: Umbilical hernia, small.;soft, nontender; no hepatosplenomehaly, BS+; abdominal aorta nontender and not dilated by palpation. Pulses 2+ Extremities: no clubbing  cyanosis or edema, Homan's sign negative  Neurologic: grossly nonfocal Psychologic: normal affect and mood.  ECG: Sinus bradycardia with PACs.  LABS:  BMET    Component Value Date/Time   NA 143 06/03/2011 1646   K 3.1* 06/03/2011 1646   CL 103 06/03/2011 1646   CO2 24 06/03/2011 1627   GLUCOSE 117* 06/03/2011 1646   BUN 20 06/03/2011 1646   CREATININE 1.00 06/03/2011 1646   CALCIUM 9.4 06/03/2011 1627   GFRNONAA 88* 06/03/2011 1627   GFRAA >90 06/03/2011 1627     Hepatic Function Panel     Component Value Date/Time   PROT 6.9 06/03/2011 1627   ALBUMIN 3.9 06/03/2011 1627   AST 30 06/03/2011 1627   ALT 23 06/03/2011 1627   ALKPHOS 59 06/03/2011 1627   BILITOT 0.8 06/03/2011 1627     CBC    Component Value Date/Time   WBC 16.7* 06/03/2011 1627   RBC 4.68 06/03/2011 1627   HGB 14.3 06/03/2011 1646   HCT 42.0 06/03/2011 1646   PLT 228 06/03/2011 1627   MCV 87.4 06/03/2011 1627   MCH 30.8 06/03/2011 1627   MCHC 35.2 06/03/2011 1627   RDW 13.0 06/03/2011 1627     BNP No results found for this basename: probnp    Lipid Panel  No results found for this basename: chol, trig, hdl, cholhdl, vldl, ldlcalc     RADIOLOGY: No results found.    ASSESSMENT AND PLAN: Mario Palmer is now 72 years old. His blood pressure today when rechecked by me was improved at 130/70 although was somewhat labored while on presentation. He's been using his CPAP therapy with 100% compliant. He has some Bystolic 5 mg daily. I recommend he increase this to 5 mg twice a day. His last echo Doppler study was over 2 years ago and I'm recommending a repeat echo Doppler study be obtained in approximately 2 months. I will see him in 3 months for followup evaluation. He will need to have his laboratory recheck particular since use of simvastatin and this will be done by his primary M.D.      Lennette Bihari, MD, [facility]  02/06/2013 4:29 PM

## 2013-02-11 ENCOUNTER — Encounter: Payer: Self-pay | Admitting: Cardiovascular Disease

## 2013-02-24 ENCOUNTER — Ambulatory Visit (HOSPITAL_COMMUNITY): Payer: Medicare Other

## 2013-03-09 IMAGING — CT CT ANGIO NECK
1 of 8 series · 6 of 33 positions shown · IV contrast (omnipaque)
Comparison: CT cervical spine 06/03/2011

CLINICAL DATA: Tree fell on head.  C2 fracture.  Rule out
vertebral artery injury

CT ANGIOGRAPHY NECK
TECHNIQUE: Multidetector CT imaging of the neck was performed
using the standard protocol during bolus administration of
intravenous contrast.  Multiplanar CT image reconstructions
including MIPs were obtained to evaluate the vascular anatomy.
Carotid stenosis measurements (when applicable) are obtained
utilizing NASCET criteria, using the distal internal carotid
diameter as the denominator.
Contrast: 50mL OMNIPAQUE IOHEXOL 350 MG/ML IV SOLN

[axial 1 x 1 · axial · 0.50mm/px · z∈[+189,+374]mm · 6 of 261 slices shown]
[im 38/261  soft-tissue]
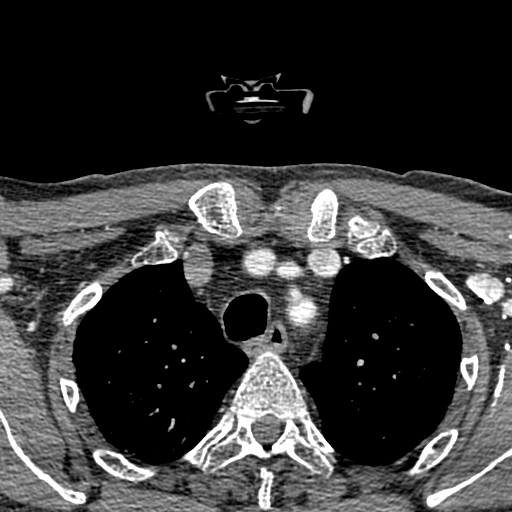
[im 75/261  bone]
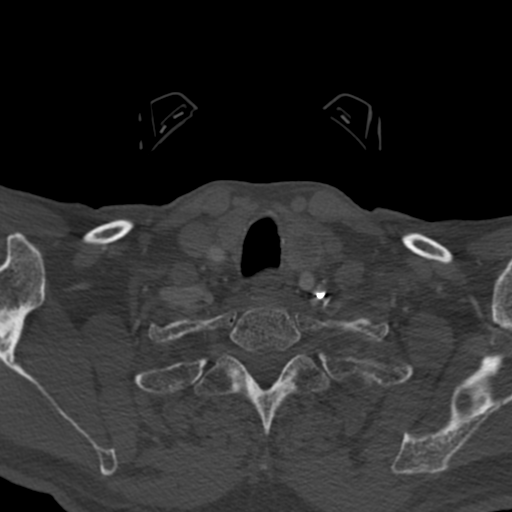
[im 112/261  soft-tissue]
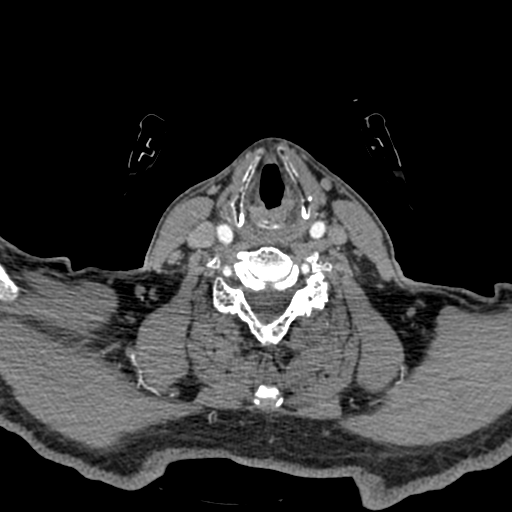
[im 149/261  bone]
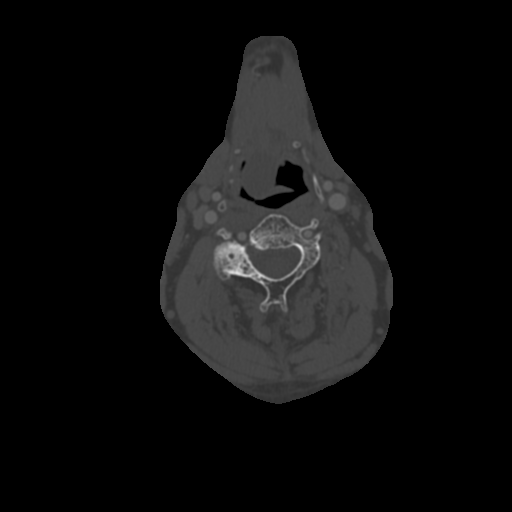
[im 186/261  soft-tissue]
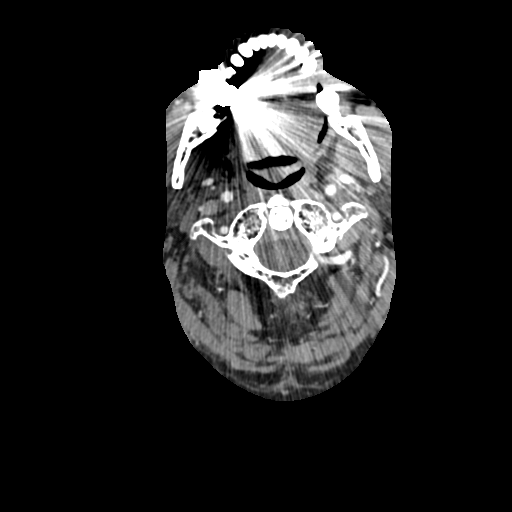
[im 223/261  bone]
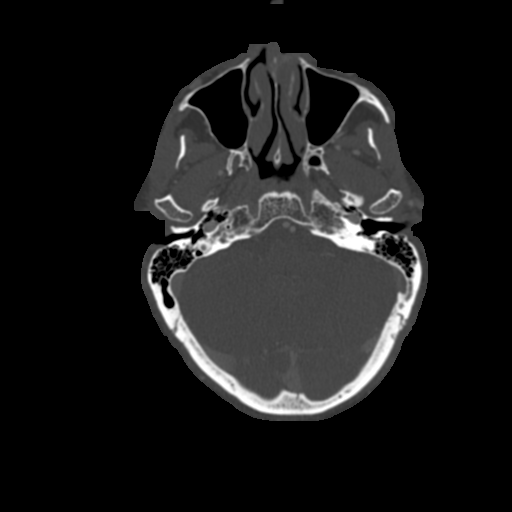

[6 of 33 positions shown; findings below may reference images not displayed]

FINDINGS: Fracture of C2 again noted.

2 cm nodule left lower thyroid.  Ultrasound of the thyroid and
biopsy is suggested if clinically appropriate.  No adenopathy in
the neck.

Carotid artery is widely patent bilaterally.  No significant
atherosclerotic disease and node dissection is present.

Both vertebral arteries are widely patent to the basilar.  No
significant vertebral artery stenosis or dissection.  No evidence
of injury related to the C2 fracture.

Cystic mass in the vallecula on the right measuring 15 x 23 mm.
This appears to be a benign cyst.  However, direct visualization is
suggested to two exclude a  mucosal neoplasm.

 Review of the MIP images confirms the above findings.
IMPRESSION: No evidence of carotid or vertebral artery injury.

2 cm left thyroid nodule.  Recommend biopsy if clinically
appropriate.

Cystic mass in the vallecula on the right.  Recommend direct
visualization.

## 2013-03-09 IMAGING — CT CT CERVICAL SPINE W/O CM
2 of 7 series · 6 of 20 positions shown, 7 images · non-contrast
Comparison: None

CT HEAD

CLINICAL DATA: Tree fell on head.  Laceration on the top of the
head.  Neck pain.

CT HEAD WITHOUT CONTRAST
CT CERVICAL SPINE WITHOUT CONTRAST
TECHNIQUE: Multidetector CT imaging of the head and cervical spine
was performed following the standard protocol without intravenous
contrast.  Multiplanar CT image reconstructions of the cervical
spine were also generated.

[Series 601: coronal · coronal · 0.42mm/px · 3 of 56 slices shown]
[im 12/56  bone]
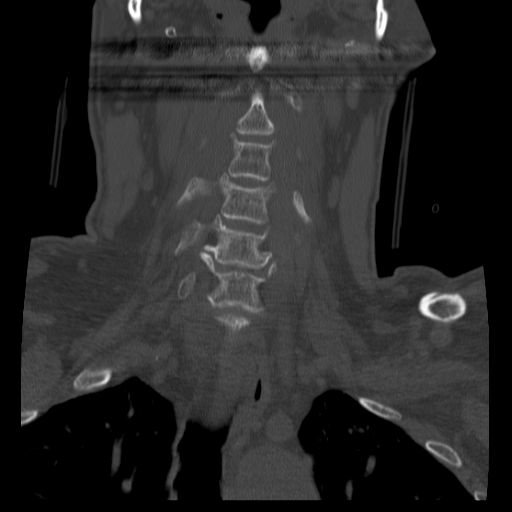
[im 23/56  bone]
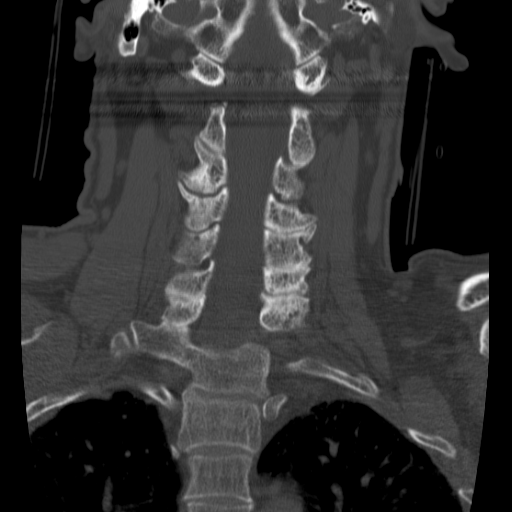
[im 34/56  bone]
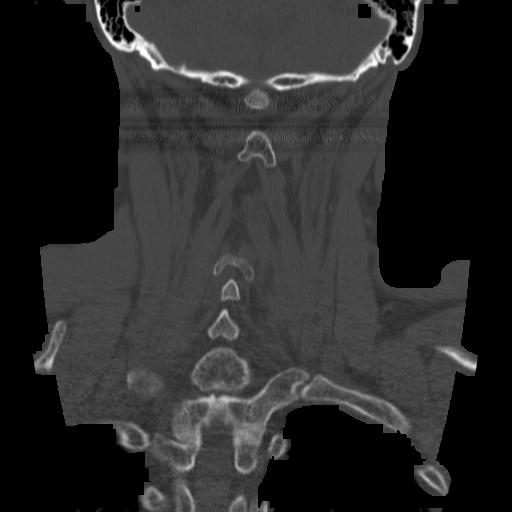

[Series 602: orthog · axial · 0.42mm/px · z∈[-359,-118]mm · 3 of 105 slices shown, 4 images]
[im 1/105  soft-tissue]
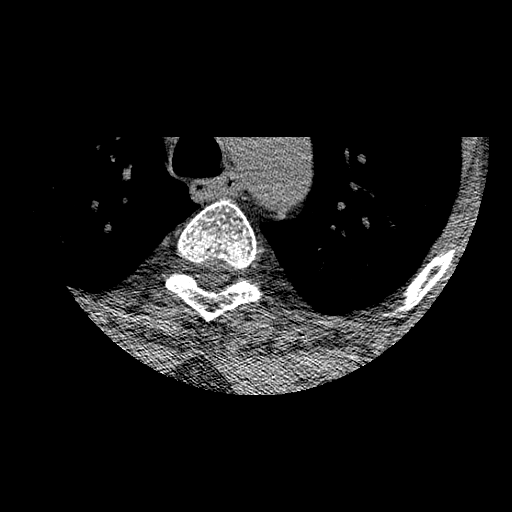
[im 1/105  bone]
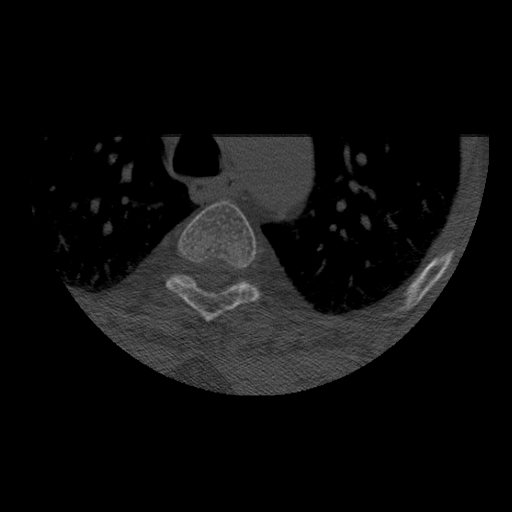
[im 53/105  bone]
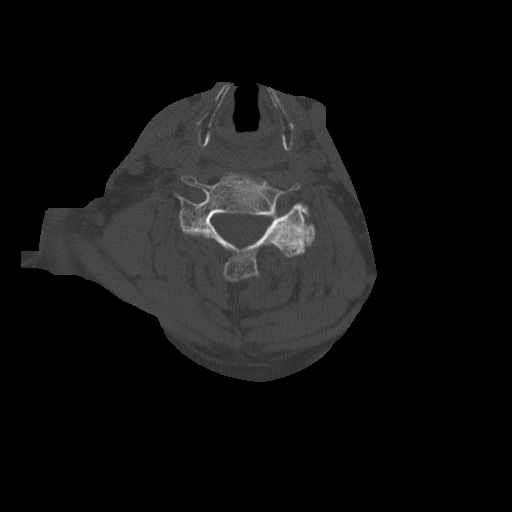
[im 105/105  bone]
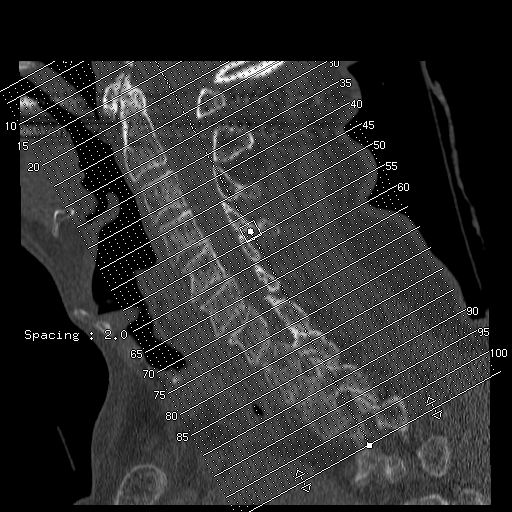

[6 of 20 positions shown; findings below may reference images not displayed]

FINDINGS: There is a large scalp laceration extending from the left
frontal region to involve the left parietal region.  Extensive
scalp edema identified.  No evidence for calvarial fracture.

There is no intra or extra-axial fluid collection or mass lesion.
The basilar cisterns and ventricles have a normal appearance.
There is no CT evidence for acute infarction or hemorrhage.
IMPRESSION: 1. No evidence for acute intracranial abnormality.
2.  Large left scalp laceration out underlying calvarial fracture.

CT CERVICAL SPINE
FINDINGS: There is a comminuted fracture of T2.  Fracture involves
the posterior aspect of the dens and extends vertically to involve
the lamina on the right side and the pedicle on the left side.  The
vertebral foramina are involved bilaterally at C2.  Fracture is
considered unstable.  The spinous process of C2 is posteriorly
displaced compared to C3.  There is little soft tissue edema.
There are degenerative changes throughout the cervical spine.  No
other fractures are identified however.

Lung apices are unremarkable.
IMPRESSION: 1.  Comminuted, unstable fracture of C2.
2.  Significant degenerative changes elsewhere in the cervical
spine.  No other fractures.

Critical test results telephoned to Lus[facility] Jim at the time of
interpretation on date 06/03/2011 at time [DATE] p.m..

## 2013-04-07 ENCOUNTER — Ambulatory Visit (HOSPITAL_COMMUNITY)
Admission: RE | Admit: 2013-04-07 | Discharge: 2013-04-07 | Disposition: A | Discharge status: Home / Self Care | Payer: Medicare Other | Source: Ambulatory Visit | Attending: Cardiovascular Disease | Admitting: Cardiovascular Disease

## 2013-04-07 DIAGNOSIS — G4730 Sleep apnea, unspecified: Secondary | ICD-10-CM | POA: Insufficient documentation

## 2013-04-07 DIAGNOSIS — I10 Essential (primary) hypertension: Secondary | ICD-10-CM | POA: Insufficient documentation

## 2013-04-07 DIAGNOSIS — R002 Palpitations: Secondary | ICD-10-CM

## 2013-04-07 DIAGNOSIS — K219 Gastro-esophageal reflux disease without esophagitis: Secondary | ICD-10-CM | POA: Insufficient documentation

## 2013-04-07 DIAGNOSIS — I059 Rheumatic mitral valve disease, unspecified: Secondary | ICD-10-CM | POA: Insufficient documentation

## 2013-04-07 DIAGNOSIS — I359 Nonrheumatic aortic valve disorder, unspecified: Secondary | ICD-10-CM | POA: Insufficient documentation

## 2013-04-07 NOTE — Progress Notes (Signed)
2D Echo Performed 04/07/2013    Marygrace Drought, RCS

## 2013-04-29 NOTE — Progress Notes (Signed)
Will discuss @ February Appointment.

## 2013-05-14 ENCOUNTER — Ambulatory Visit (INDEPENDENT_AMBULATORY_CARE_PROVIDER_SITE_OTHER): Payer: Medicare Other | Admitting: Cardiovascular Disease

## 2013-06-10 ENCOUNTER — Encounter: Payer: Self-pay | Admitting: Cardiovascular Disease

## 2013-06-10 ENCOUNTER — Ambulatory Visit (INDEPENDENT_AMBULATORY_CARE_PROVIDER_SITE_OTHER): Payer: Medicare Other | Admitting: Cardiovascular Disease

## 2013-06-10 VITALS — BP 122/78 | HR 60 | Ht 69.0 in | Wt 195.6 lb

## 2013-06-10 DIAGNOSIS — E559 Vitamin D deficiency, unspecified: Secondary | ICD-10-CM

## 2013-06-10 DIAGNOSIS — Z9989 Dependence on other enabling machines and devices: Principal | ICD-10-CM

## 2013-06-10 DIAGNOSIS — K219 Gastro-esophageal reflux disease without esophagitis: Secondary | ICD-10-CM

## 2013-06-10 DIAGNOSIS — I10 Essential (primary) hypertension: Secondary | ICD-10-CM

## 2013-06-10 DIAGNOSIS — G4733 Obstructive sleep apnea (adult) (pediatric): Secondary | ICD-10-CM

## 2013-06-10 NOTE — Patient Instructions (Addendum)
Your physician recommends that you schedule a follow-up appointment in: 12 months.  

## 2013-06-17 ENCOUNTER — Encounter: Payer: Self-pay | Admitting: Cardiovascular Disease

## 2013-06-17 DIAGNOSIS — E559 Vitamin D deficiency, unspecified: Secondary | ICD-10-CM | POA: Insufficient documentation

## 2013-06-17 DIAGNOSIS — I10 Essential (primary) hypertension: Secondary | ICD-10-CM | POA: Insufficient documentation

## 2013-06-17 DIAGNOSIS — I129 Hypertensive chronic kidney disease with stage 1 through stage 4 chronic kidney disease, or unspecified chronic kidney disease: Secondary | ICD-10-CM | POA: Insufficient documentation

## 2013-06-17 NOTE — Progress Notes (Signed)
Patient ID: Mario Palmer, male   DOB: Jun 15, 1940, 73 y.o.   MRN: [id number]      HPI: [name] is a 73 y.o. male who presents for four month cardiology evaluation.  [name] has a history of hypertension, obstructive sleep apnea on CPAP therapy, GERD, palpitations with atrial ectopy,. 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 He has history of hyperlipidemia and has been on simvastatin and had intermittently stopped this secondary to arthralgias. He has noticed occasional palpitations without frank syncope. He does have a history of prior cervical fracture which is healed. He has been using his CPAP. Recent laboratory done while he was on simvastatin was reviewed and showed a total cholesterol 182 HDL 60 LDL 97 triglycerides 125 and VLDL cholesterol 25. This was laboratory done from 09/09/2012. When I last saw him in November 2014, I recommended further titration of his Bystolic 25 mg twice a day. He subsequently has also undergone a followup echo Doppler study which showed normal systolic function with ejection fraction of 55-60%. There was evidence for mild aortic and mitral insufficiency. His left atrium was moderately dilated. Over the past several months, he has resumed taking his simvastatin. He denies chest pain. He denies tach tachypalpitations. He did have blood work done in December at [facility]. Fasting glucose was 79. He was not anemic. PSA was increased at 4.7. Vitamin D level was low at 27.2. Total cholesterol was 211 triglycerides 151 and LDL was 127. Since that time he resumed his simvastatin.  Past Medical History  Diagnosis Date  . Hypertension   . High cholesterol   . Sleep apnea   . GERD (gastroesophageal reflux disease)   . Palpitation     with ecotopy  . Hx of echocardiogram 12/2010    EF 55%, There is Stage 1 diastolic dyfunction. Normal LV filling pressure. Aortic sclerosis with mild AI, mild TR    History reviewed. No pertinent past  surgical history.  No Known Allergies  Current Outpatient Prescriptions  Medication Sig Dispense Refill  . amLODipine (NORVASC) 10 MG tablet Take 10 mg by mouth daily.      Marland Kitchen aspirin 81 MG chewable tablet Chew 81 mg by mouth daily.      . Cholecalciferol (VITAMIN D) 2000 UNITS tablet Take 2,000 Units by mouth daily.      . hydrochlorothiazide (HYDRODIURIL) 25 MG tablet Take 25 mg by mouth daily.      Marland Kitchen lisinopril (PRINIVIL,ZESTRIL) 40 MG tablet Take 40 mg by mouth daily.      . nebivolol (BYSTOLIC) 5 MG tablet Take 1 tablet (5 mg total) by mouth 2 (two) times daily.  60 tablet  6  . simvastatin (ZOCOR) 20 MG tablet Take 1 tablet by mouth daily.       No current facility-administered medications for this visit.    History   Social History  . Marital Status: Married    Spouse Name: N/A    Number of Children: N/A  . Years of Education: N/A   Occupational History  . Not on file.   Social History Main Topics  . Smoking status: Former Smoker    Types: Cigarettes  . Smokeless tobacco: Never Used     Comment: quit smoking in 1982  . Alcohol Use: No  . Drug Use: No  . Sexual Activity: Not on file   Other Topics Concern  . Not on file   Social History Narrative  . No narrative on  file    History reviewed. No pertinent family history.  Social history is notable in that he is married. Has 2 children and 2 grandchildren. 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 There is no tobacco or alcohol use.  ROS is negative for fevers, chills or night sweats.  He denies visual symptoms. He denies change in hearing. There is no weight change. He is unaware of lymphadenopathy. He denies any recent discomfort in his neck. He status post prior C2 vertebral fracture and required neck brace for 2 months last year. He does note palpitations. There is no chest pressure. He denies cough or wheezing. He denies presyncope or syncope. He denies bleeding denies abdominal pain. Has nausea or  vomiting. There is no claudication. He does have an umbilical hernia. He's not diabetic. He denies skin lesions. He denies residual daytime sleepiness of breakthrough snoring on CPAP therapy for obstructive sleep apnea . Other comprehensive 14 point system review is negative.  PE BP 122/78  Pulse 60  Ht 5\' 9"  (1.753 m)  Wt 195 lb 9.6 oz (88.724 kg)  BMI 28.87 kg/m2  Repeat blood pressure when taken by me 130/70 General: Alert, oriented, no distress.  Skin: normal turgor, no rashes HEENT: Normocephalic, atraumatic. Pupils round and reactive; sclera anicteric;no lid lag.  Nose without nasal septal hypertrophy Mouth/Parynx benign; Mallinpatti scale 3 Neck: No JVD, no carotid bruits normal carotid upstroke Lungs: clear to ausculatation and percussion; no wheezing or rales Heart: RRR, s1 s2 normal 1/6 systolic murmur. I did not appreciate any significant diastolic murmur. No S3 or S4 gallop. Abdomen: Umbilical hernia, small.;soft, nontender; no hepatosplenomehaly, BS+; abdominal aorta nontender and not dilated by palpation. Pulses 2+ Extremities: no clubbing cyanosis or edema, Homan's sign negative  Neurologic: grossly nonfocal Psychologic: normal affect and mood.   Normal sinus rhythm at 60 beats per minute. No ectopy. Nonspecific T changes in lead 3.  Prior 02/04/2013 ECG: Sinus bradycardia with PACs.  LABS:  BMET    Component Value Date/Time   NA 143 06/03/2011 1646   K 3.1* 06/03/2011 1646   CL 103 06/03/2011 1646   CO2 24 06/03/2011 1627   GLUCOSE 117* 06/03/2011 1646   BUN 20 06/03/2011 1646   CREATININE 1.00 06/03/2011 1646   CALCIUM 9.4 06/03/2011 1627   GFRNONAA 88* 06/03/2011 1627   GFRAA >90 06/03/2011 1627     Hepatic Function Panel     Component Value Date/Time   PROT 6.9 06/03/2011 1627   ALBUMIN 3.9 06/03/2011 1627   AST 30 06/03/2011 1627   ALT 23 06/03/2011 1627   ALKPHOS 59 06/03/2011 1627   BILITOT 0.8 06/03/2011 1627     CBC    Component Value Date/Time   WBC 16.7*  06/03/2011 1627   RBC 4.68 06/03/2011 1627   HGB 14.3 06/03/2011 1646   HCT 42.0 06/03/2011 1646   PLT 228 06/03/2011 1627   MCV 87.4 06/03/2011 1627   MCH 30.8 06/03/2011 1627   MCHC 35.2 06/03/2011 1627   RDW 13.0 06/03/2011 1627     BNP No results found for this basename: probnp    Lipid Panel  No results found for this basename: chol,  trig,  hdl,  cholhdl,  vldl,  ldlcalc     RADIOLOGY: No results found.    ASSESSMENT AND PLAN: Mr. Donavan Kerlin is a 73 years old gentleman with a history of hypertension, sleep apnea, palpitations with documented atrial ectopy, and hyperlipidemia. His blood pressure today is  now controlled and when rechecked by me was 120/64. Heart rate is regular without ectopy and he is tolerating the increase Bystolic dose of 5 mg twice a day. He had held his simvastatin secondary to development of arthralgias and subsequent blood work in December showed elevation of his lipids. He has since resumed taking the simvastatin. I am suggesting he and 2 this vitamin D as well as coenzyme Q10 which may also improve some of his arthralgic symptoms. Presently there is no edema. I reviewed his laboratory. I reviewed his echo Doppler findings. He continues to have normal systolic and diastolic function. He is using his CPAP therapy with 100% compliance. There is no residual daytime sleepiness or awareness of breakthrough snoring. I will see him in one year for cardiology reevaluation.   Troy Sine, MD, [facility]  06/17/2013 10:06 PM

## 2013-09-27 ENCOUNTER — Other Ambulatory Visit: Payer: Self-pay | Admitting: Cardiovascular Disease

## 2013-09-29 NOTE — Telephone Encounter (Signed)
Rx refill sent to patient pharmacy   

## 2014-02-12 ENCOUNTER — Telehealth: Payer: Self-pay | Admitting: *Deleted

## 2014-02-12 ENCOUNTER — Other Ambulatory Visit: Payer: Self-pay | Admitting: *Deleted

## 2014-02-12 NOTE — Telephone Encounter (Signed)
Left message for the patient to return a call to me to discuss medication letter he received from his insurance company in reference to his bystolic.

## 2014-02-13 ENCOUNTER — Telehealth: Payer: Self-pay | Admitting: Cardiovascular Disease

## 2014-02-13 ENCOUNTER — Other Ambulatory Visit: Payer: Self-pay | Admitting: *Deleted

## 2014-02-13 MED ORDER — NEBIVOLOL HCL 10 MG PO TABS: 5.0000 mg | ORAL_TABLET | Freq: Two times a day (BID) | ORAL | Status: DC

## 2014-02-13 NOTE — Telephone Encounter (Signed)
Spoke with patient in reference to the letter that he received from his insurance company stating that as of January 2016 they will no longer cover his Bystolic 5 mg to be taken twice daily. The patient states that Dr. Claiborne Billings has him taking the medication  Like this because he had complained about having more palpitations at night. Since the insurance plans to discontinue coverage of the 5 mg tablet, I will sen the patient a new prescription for Bystolic 10 mg tabs to take 0.5 mg twice daily.  Nehemiah Massed, our clinical pharmacist states that it is chemically okay to do this.

## 2014-06-08 ENCOUNTER — Encounter: Payer: Self-pay | Admitting: Cardiovascular Disease

## 2014-06-08 ENCOUNTER — Ambulatory Visit (INDEPENDENT_AMBULATORY_CARE_PROVIDER_SITE_OTHER): Payer: Medicare Other | Admitting: Cardiovascular Disease

## 2014-06-08 VITALS — BP 132/80 | HR 59 | Ht 70.0 in | Wt 195.3 lb

## 2014-06-08 DIAGNOSIS — Z9989 Dependence on other enabling machines and devices: Secondary | ICD-10-CM

## 2014-06-08 DIAGNOSIS — G4733 Obstructive sleep apnea (adult) (pediatric): Secondary | ICD-10-CM

## 2014-06-08 DIAGNOSIS — I10 Essential (primary) hypertension: Secondary | ICD-10-CM

## 2014-06-08 NOTE — Progress Notes (Signed)
Patient ID: Mario Palmer, male   DOB: [birth year], 74 y.o.   MRN: [id number]      HPI: Mario Palmer is a 74 y.o. male who presents for a one year cardiology evaluation.  Mr. Siefert has a history of hypertension, obstructive sleep apnea on CPAP therapy, GERD, and palpitations with atrial ectopy,. 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 He has history of hyperlipidemia and has been on simvastatin and had intermittently stopped this secondary to arthralgias.  An echo Doppler study in January 2015 revealed a normal systolic function with ejection fraction of 55-60%. There was evidence for mild aortic and mitral insufficiency. His left atrium was moderately dilated. Last year he resumed taking his simvastatin.  He is followed by Dr. Baird Cancer at [facility].  He has had a history of mild PSA elevation at 4.8, which he is following.  Recent laboratories showing a total cholesterol 180, HDL 57, LDL 93, triglycerides 148 VLDL 30.  He is normal renal function and liver function studies.  He rides a stationary bicycle every day.  He denies chest pain.  He has obstructive sleep apnea and has been utilizing CPAP with 100% compliance.  However, recently he feels that he is not getting the same quality sleep that he had in the past.  He has not had a download of his CPAP unit in a long time.   Past Medical History  Diagnosis Date  . Hypertension   . High cholesterol   . Sleep apnea   . GERD (gastroesophageal reflux disease)   . Palpitation     with ecotopy  . Hx of echocardiogram 12/2010    EF 55%, There is Stage 1 diastolic dyfunction. Normal LV filling pressure. Aortic sclerosis with mild AI, mild TR    History reviewed. No pertinent past surgical history.  No Known Allergies  Current Outpatient Prescriptions  Medication Sig Dispense Refill  . amLODipine (NORVASC) 10 MG tablet Take 10 mg by mouth daily.    Marland Kitchen aspirin 81 MG chewable tablet Chew 81 mg by mouth daily.     . Cholecalciferol (VITAMIN D) 2000 UNITS tablet Take 2,000 Units by mouth daily.    . hydrochlorothiazide (HYDRODIURIL) 25 MG tablet Take 25 mg by mouth daily.    Marland Kitchen lisinopril (PRINIVIL,ZESTRIL) 40 MG tablet Take 40 mg by mouth daily.    . nebivolol (BYSTOLIC) 10 MG tablet Take 10 mg by mouth daily.    . NON FORMULARY CPAP therapy    . simvastatin (ZOCOR) 20 MG tablet Take 1 tablet by mouth daily.     No current facility-administered medications for this visit.    History   Social History  . Marital Status: Married    Spouse Name: N/A  . Number of Children: N/A  . Years of Education: N/A   Occupational History  . Not on file.   Social History Main Topics  . Smoking status: Former Smoker    Types: Cigarettes  . Smokeless tobacco: Never Used     Comment: quit smoking in 1982  . Alcohol Use: No  . Drug Use: No  . Sexual Activity: Not on file   Other Topics Concern  . Not on file   Social History Narrative    History reviewed. No pertinent family history.  Social history is notable in that he is married. Has 2 children and 2 grandchildren. 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 There is no tobacco or alcohol use.  ROS General: Negative; No fevers, chills, or night sweats;  HEENT: Negative; No changes in vision or hearing, sinus congestion, difficulty swallowing Pulmonary: Negative; No cough, wheezing, shortness of breath, hemoptysis Cardiovascular: Negative; No chest pain, presyncope, syncope, palpitations GI: Negative; No nausea, vomiting, diarrhea, or abdominal pain GU: Positive for mild PSA elevation for which he was treated with an antibiotic.  Repeat laboratory will be forthcoming by Dr. Bryon Lions.  No dysuria, hematuria, or difficulty voiding Musculoskeletal: Negative; no myalgias, joint pain, or weakness Hematologic/Oncology: Negative; no easy bruising, bleeding Endocrine: Negative; no heat/cold intolerance; no diabetes Neuro: Negative; no  changes in balance, headaches Skin: Negative; No rashes or skin lesions Psychiatric: Negative; No behavioral problems, depression Sleep: Positive for obstructive sleep apnea on CPAP therapy.  No snoring, daytime sleepiness, hypersomnolence, bruxism, restless legs, hypnogognic hallucinations, no cataplexy Other comprehensive 14 point system review is negative.  PE BP 132/80 mmHg  Pulse 59  Ht '5\' 10"'  (1.778 m)  Wt 195 lb 4.8 oz (88.587 kg)  BMI 28.02 kg/m2  Repeat blood pressure when taken by me 130/70 General: Alert, oriented, no distress.  Skin: normal turgor, no rashes HEENT: Normocephalic, atraumatic. Pupils round and reactive; sclera anicteric;no lid lag.  Nose without nasal septal hypertrophy Mouth/Parynx benign; Mallinpatti scale 3 Neck: No JVD, no carotid bruits normal carotid upstroke Lungs: clear to ausculatation and percussion; no wheezing or rales Heart: RRR, s1 s2 normal 1/6 systolic murmur. I did not appreciate any significant diastolic murmur. No S3 or S4 gallop. Abdomen: Umbilical hernia, small.;soft, nontender; no hepatosplenomehaly, BS+; abdominal aorta nontender and not dilated by palpation. Pulses 2+ Extremities: no clubbing cyanosis or edema, Homan's sign negative  Neurologic: grossly nonfocal Psychologic: normal affect and mood.  ECG (independently read by me): Sinus bradycardia 59 bpm.  Mild RV conduction delay.  Normal intervals.   March 2015 ECG: Normal sinus rhythm at 60 beats per minute. No ectopy. Nonspecific T changes in lead 3.  Prior 02/04/2013 ECG: Sinus bradycardia with PACs.  LABS:  BMET  BMP Latest Ref Rng 06/03/2011 06/03/2011  Glucose 70 - 99 mg/dL 117(H) 117(H)  BUN 6 - 23 mg/dL 20 16  Creatinine 0.50 - 1.35 mg/dL 1.00 0.79  Sodium 135 - 145 mEq/L 143 139  Potassium 3.5 - 5.1 mEq/L 3.1(L) 3.5  Chloride 96 - 112 mEq/L 103 101  CO2 19 - 32 mEq/L - 24  Calcium 8.4 - 10.5 mg/dL - 9.4     Hepatic Function Panel   Hepatic Function Latest  Ref Rng 06/03/2011  Total Protein 6.0 - 8.3 g/dL 6.9  Albumin 3.5 - 5.2 g/dL 3.9  AST 0 - 37 U/L 30  ALT 0 - 53 U/L 23  Alk Phosphatase 39 - 117 U/L 59  Total Bilirubin 0.3 - 1.2 mg/dL 0.8     CBC    Component Value Date/Time   WBC 16.7* 06/03/2011 1627   RBC 4.68 06/03/2011 1627   HGB 14.3 06/03/2011 1646   HCT 42.0 06/03/2011 1646   PLT 228 06/03/2011 1627   MCV 87.4 06/03/2011 1627   MCH 30.8 06/03/2011 1627   MCHC 35.2 06/03/2011 1627   RDW 13.0 06/03/2011 1627     BNP No results found for: PROBNP  Lipid Panel  No results found for: CHOL   RADIOLOGY: No results found.    ASSESSMENT AND PLAN: Mr. Daron Stutz is a 74 years old gentleman with a history of hypertension, sleep apnea, palpitations with documented atrial ectopy, and hyperlipidemia.  His blood  pressure today is controlled on Bystolic 10 mg, lisinopril 40 mg, HCTZ 25 mg, and amlodipine 10 mg.  He is not having any peripheral edema.  His palpitations have also been controlled on his increased Bystolic dosing at 10 mg.  He is on simvastatin 20 mg and is tolerating this well without recurrent myalgias.  His most recent laboratory done by Dr. Baird Cancer had shown an LDL of 93.  He is on CPAP therapy for his obstructive sleep apnea.  I have suggested that a download be obtained so that we may be certain he is being adequately treated and that he does not need adjustment in his CPAP pressure.  I discussed increased humidification particularly with his complaints of episodic dry mouth and sinuses.  He remains active.  He rides a stationary bicycle every day.  He's not having anginal symptoms or any significant palpitations on current therapy.  I reviewed Dr. Baird Cancer laboratory.  I will see him in one year for cardiology reevaluation or sooner if problems arise.  Time spent: 25 minutes  Troy Sine, MD, [facility]  06/08/2014 4:58 PM

## 2014-06-08 NOTE — Patient Instructions (Signed)
Your physician wants you to follow-up in: 1 year or sooner if needed with Dr. Kelly. No changes were made today in your therapy. You will receive a reminder letter in the mail two months in advance. If you don't receive a letter, please call our office to schedule the follow-up appointment. 

## 2014-06-10 ENCOUNTER — Encounter: Payer: Self-pay | Admitting: Cardiovascular Disease

## 2014-06-16 ENCOUNTER — Encounter: Payer: Self-pay | Admitting: Cardiovascular Disease

## 2014-09-08 ENCOUNTER — Telehealth: Payer: Self-pay | Admitting: Cardiovascular Disease

## 2014-09-08 NOTE — Telephone Encounter (Signed)
Pt need  air pressure checked on his C-Pap machine. They said he would need a prescription or order from his doctor.He also wants the results from the chip from his C-Pap machine.

## 2014-09-09 NOTE — Telephone Encounter (Signed)
Spoke with  Patient and [employer]. Current download needed before assessing problem. Also recommended that patient try not to sleep on his back. sleeping in this position will help to obstruct his airflow. Also wash and dry his face thoroughly before putting face mask on. This will help the mask to seal more tightly. Patient voiced understanding.

## 2014-09-23 ENCOUNTER — Encounter: Payer: Self-pay | Admitting: Cardiovascular Disease

## 2014-09-30 ENCOUNTER — Encounter: Payer: Self-pay | Admitting: *Deleted

## 2015-02-10 DIAGNOSIS — H04123 Dry eye syndrome of bilateral lacrimal glands: Secondary | ICD-10-CM | POA: Diagnosis not present

## 2015-02-10 DIAGNOSIS — H25813 Combined forms of age-related cataract, bilateral: Secondary | ICD-10-CM | POA: Diagnosis not present

## 2015-04-15 ENCOUNTER — Ambulatory Visit (INDEPENDENT_AMBULATORY_CARE_PROVIDER_SITE_OTHER): Payer: Medicare Other | Admitting: Cardiovascular Disease

## 2015-04-15 ENCOUNTER — Encounter: Payer: Self-pay | Admitting: Cardiovascular Disease

## 2015-04-15 VITALS — BP 138/68 | HR 52 | Ht 70.0 in | Wt 194.0 lb

## 2015-04-15 DIAGNOSIS — I10 Essential (primary) hypertension: Secondary | ICD-10-CM | POA: Diagnosis not present

## 2015-04-15 DIAGNOSIS — Z9989 Dependence on other enabling machines and devices: Principal | ICD-10-CM

## 2015-04-15 DIAGNOSIS — G4733 Obstructive sleep apnea (adult) (pediatric): Secondary | ICD-10-CM | POA: Diagnosis not present

## 2015-04-15 NOTE — Progress Notes (Signed)
Patient ID: Mario Palmer, male   DOB: 1941-02-11, 74 y.o.   MRN: [id number]   Primary M.D.: Dr. Bryon Lions  HPI: Mario Palmer is a 75 y.o. male who presents for a sleep clinic evaluation  Mario Palmer has a history of hypertension, obstructive sleep apnea on CPAP therapy, GERD, and palpitations with atrial ectopy,. 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 He has history of hyperlipidemia and has been on simvastatin and had intermittently stopped this secondary to arthralgias.  An echo Doppler study in January 2015 revealed a normal systolic function with ejection fraction of 55-60%. There was evidence for mild aortic and mitral insufficiency. His left atrium was moderately dilated.   Mario Palmer was diagnosed with obstructive sleep apnea in 2010. At that time, he was diagnosed with mild sleep apnea with events worse in the supine position and during rim sleep. He has been on CPAP therapy since that time and states initially he was started on an 8 cm water pressure but also this was later titrated to 10 cm.  He admits to 100% compliance.  Over the past 2 weeks, his CPAP has failed and essentially his machine quit working.  Prior to completely stop working.  It was stopping several times per night and started to be at least 4 times per night.  Yesterday he went to [facility] and was told that the motor was gone and he needed a new CPAP machine.  A download was done.  The patient is seen in sleep clinic today.  He admits to 100% compliance.  Interrogation of the machine indicated.  His 30 day AHI 2.5.  He was averaging 6 hours per night.  He is unaware of previous breakthrough snoring.  He denies residual daytime sleepiness.  He denies restless legs.  When the machine was only intermittently working, he did admit to experiencing occasional palpitations while sleeping. He presents for evaluation.  Past Medical History  Diagnosis Date  . Hypertension   . High cholesterol   .  Sleep apnea   . GERD (gastroesophageal reflux disease)   . Palpitation     with ecotopy  . Hx of echocardiogram 12/2010    EF 55%, There is Stage 1 diastolic dyfunction. Normal LV filling pressure. Aortic sclerosis with mild AI, mild TR    No past surgical history on file.  No Known Allergies  Current Outpatient Prescriptions  Medication Sig Dispense Refill  . amLODipine (NORVASC) 10 MG tablet Take 10 mg by mouth daily.    Marland Kitchen aspirin 81 MG chewable tablet Chew 81 mg by mouth daily.    . Cholecalciferol (VITAMIN D) 2000 UNITS tablet Take 2,000 Units by mouth daily.    . fluticasone (FLONASE) 50 MCG/ACT nasal spray Place 2 sprays into both nostrils as needed.     . hydrochlorothiazide (HYDRODIURIL) 25 MG tablet Take 25 mg by mouth daily.    Marland Kitchen lisinopril (PRINIVIL,ZESTRIL) 40 MG tablet Take 40 mg by mouth daily.    . nebivolol (BYSTOLIC) 10 MG tablet Take 10 mg by mouth daily.    . NON FORMULARY CPAP therapy    . simvastatin (ZOCOR) 20 MG tablet Take 1 tablet by mouth daily.     No current facility-administered medications for this visit.    Social History   Social History  . Marital Status: Married    Spouse Name: N/A  . Number of Children: N/A  . Years of Education: N/A   Occupational History  .  Not on file.   Social History Main Topics  . Smoking status: Former Smoker    Types: Cigarettes  . Smokeless tobacco: Never Used     Comment: quit smoking in 1982  . Alcohol Use: No  . Drug Use: No  . Sexual Activity: Not on file   Other Topics Concern  . Not on file   Social History Narrative    History reviewed. No pertinent family history.  Social history is notable in that he is married. Has 2 children and 2 grandchildren. 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 There is no tobacco or alcohol use.  ROS General: Negative; No fevers, chills, or night sweats;  HEENT: Negative; No changes in vision or hearing, sinus congestion, difficulty  swallowing Pulmonary: Negative; No cough, wheezing, shortness of breath, hemoptysis Cardiovascular:  Occasional palpitationsNegative; No chest pain, presyncope, syncope, GI: Negative; No nausea, vomiting, diarrhea, or abdominal pain GU: Positive for mild PSA elevation for which he was treated with an antibiotic.  Repeat laboratory will be forthcoming by Dr. Bryon Lions.  No dysuria, hematuria, or difficulty voiding Musculoskeletal: Negative; no myalgias, joint pain, or weakness Hematologic/Oncology: Negative; no easy bruising, bleeding Endocrine: Negative; no heat/cold intolerance; no diabetes Neuro: Negative; no changes in balance, headaches Skin: Negative; No rashes or skin lesions Psychiatric: Negative; No behavioral problems, depression Sleep: Positive for obstructive sleep apnea on CPAP therapy.  No snoring, daytime sleepiness, hypersomnolence, bruxism, restless legs, hypnogognic hallucinations, no cataplexy Other comprehensive 14 point system review is negative.  PE BP 138/68 mmHg  Pulse 52  Ht '5\' 10"'  (1.778 m)  Wt 194 lb (87.998 kg)  BMI 27.84 kg/m2  SpO2 98%  Repeat blood pressure when taken by me 130/70  Wt Readings from Last 3 Encounters:  04/15/15 194 lb (87.998 kg)  06/08/14 195 lb 4.8 oz (88.587 kg)  06/10/13 195 lb 9.6 oz (88.724 kg)   General: Alert, oriented, no distress.  Skin: normal turgor, no rashes HEENT: Normocephalic, atraumatic. Pupils round and reactive; sclera anicteric;no lid lag.  Nose without nasal septal hypertrophy Mouth/Parynx benign; Mallinpatti scale 3 Neck: No JVD, no carotid bruits normal carotid upstroke Lungs: clear to ausculatation and percussion; no wheezing or rales Heart: RRR, s1 s2 normal 1/6 systolic murmur. I did not appreciate any significant diastolic murmur. No S3 or S4 gallop. Abdomen: Umbilical hernia, small.;soft, nontender; no hepatosplenomehaly, BS+; abdominal aorta nontender and not dilated by palpation. Pulses  2+ Extremities: no clubbing cyanosis or edema, Homan's sign negative  Neurologic: grossly nonfocal Psychologic: normal affect and mood.   Mario Palmer (independently read by me): Sinus bradycardia 59 bpm.  Mild RV conduction delay.  Normal intervals.  Mario 2015 ECG: Normal sinus rhythm at 60 beats per minute. No ectopy. Nonspecific T changes in lead 3.  Prior 02/04/2013 ECG: Sinus bradycardia with PACs.  LABS:  BMET  BMP Latest Ref Rng 06/03/2011 06/03/2011  Glucose 70 - 99 mg/dL 117(H) 117(H)  BUN 6 - 23 mg/dL 20 16  Creatinine 0.50 - 1.35 mg/dL 1.00 0.79  Sodium 135 - 145 mEq/L 143 139  Potassium 3.5 - 5.1 mEq/L 3.1(L) 3.5  Chloride 96 - 112 mEq/L 103 101  CO2 19 - 32 mEq/L - 24  Calcium 8.4 - 10.5 mg/dL - 9.4    Hepatic Function Latest Ref Rng 06/03/2011  Total Protein 6.0 - 8.3 g/dL 6.9  Albumin 3.5 - 5.2 g/dL 3.9  AST 0 - 37 U/L 30  ALT 0 - 53 U/L 23  Alk Phosphatase 39 - 117 U/L 59  Total Bilirubin 0.3 - 1.2 mg/dL 0.8   CBC Latest Ref Rng 06/03/2011 06/03/2011  WBC 4.0 - 10.5 K/uL - 16.7(H)  Hemoglobin 13.0 - 17.0 g/dL 14.3 14.4  Hematocrit 39.0 - 52.0 % 42.0 40.9  Platelets 150 - 400 K/uL - 228   Lab Results  Component Value Date   MCV 87.4 06/03/2011   No results found for: TSH   BNP No results found for: PROBNP  Lipid Panel  No results found for: CHOL   RADIOLOGY: No results found.    ASSESSMENT AND PLAN: Mario Palmer is a 75 years old gentleman with a history of hypertension, sleep apnea, palpitations with documented atrial ectopy, and hyperlipidemia.  His blood pressure today is controlled on Bystolic 10 mg, lisinopril 40 mg, HCTZ 25 mg, and amlodipine 10 mg.   He has been on CPAP therapy since 2010 and over the last 2 weeks.  His CPAP unit essentially has burnt out.  He is in need for new machine.  He continues to derive significant benefit from CPAP therapy and admits to 100% compliance.  He has noticed some increased palpitations recently.   Since the machine is not been functioning well and continues to take Bystolic for this.  I have written him a new prescription for a ResMed air since 10 CPAP unit to be set at a 10 cm water pressure with heated coil and heated humidification.  A download will be obtained 30 days after he gets his new machine.  Per Medicare requirements I will need to see him within 90 days of reinitiation of his new machine for face-to-face evaluation.   Time spent: 25 minutes  Troy Sine, MD, [facility]  04/15/2015 3:45 PM

## 2015-04-15 NOTE — Patient Instructions (Signed)
Your physician recommends that you schedule a follow-up appointment in: March or April sleep clinic.

## 2015-04-16 DIAGNOSIS — G4733 Obstructive sleep apnea (adult) (pediatric): Secondary | ICD-10-CM | POA: Diagnosis not present

## 2015-04-19 DIAGNOSIS — G4733 Obstructive sleep apnea (adult) (pediatric): Secondary | ICD-10-CM | POA: Diagnosis not present

## 2015-05-20 DIAGNOSIS — G4733 Obstructive sleep apnea (adult) (pediatric): Secondary | ICD-10-CM | POA: Diagnosis not present

## 2015-06-03 DIAGNOSIS — H04123 Dry eye syndrome of bilateral lacrimal glands: Secondary | ICD-10-CM | POA: Diagnosis not present

## 2015-06-03 DIAGNOSIS — H25813 Combined forms of age-related cataract, bilateral: Secondary | ICD-10-CM | POA: Diagnosis not present

## 2015-06-08 ENCOUNTER — Ambulatory Visit (INDEPENDENT_AMBULATORY_CARE_PROVIDER_SITE_OTHER): Payer: Medicare Other | Admitting: Cardiovascular Disease

## 2015-06-08 ENCOUNTER — Encounter: Payer: Self-pay | Admitting: Cardiovascular Disease

## 2015-06-08 VITALS — BP 143/82 | HR 67 | Ht 70.0 in | Wt 196.3 lb

## 2015-06-08 DIAGNOSIS — G4733 Obstructive sleep apnea (adult) (pediatric): Secondary | ICD-10-CM | POA: Diagnosis not present

## 2015-06-08 DIAGNOSIS — I10 Essential (primary) hypertension: Secondary | ICD-10-CM | POA: Diagnosis not present

## 2015-06-08 DIAGNOSIS — Z9989 Dependence on other enabling machines and devices: Principal | ICD-10-CM

## 2015-06-08 NOTE — Patient Instructions (Signed)
Your physician wants you to follow-up in: 1 year or sooner if needed in sleep clinic with Dr Kelly. You will receive a reminder letter in the mail two months in advance. If you don't receive a letter, please call our office to schedule the follow-up appointment.  

## 2015-06-10 ENCOUNTER — Encounter: Payer: Self-pay | Admitting: Cardiovascular Disease

## 2015-06-10 NOTE — Progress Notes (Signed)
Patient ID: Mario Palmer, male   DOB: 1940/10/03, 75 y.o.   MRN: [id number]    Primary M.D.: Dr. Bryon Lions  HPI: Mario Palmer is a 75 y.o. male who presents for a sleep clinic evaluation following obtaining a new CPAP machine  Mario Palmer has a history of hypertension, obstructive sleep apnea on CPAP therapy, GERD, and palpitations with atrial ectopy,. 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 He has history of hyperlipidemia and has been on simvastatin and had intermittently stopped this secondary to arthralgias.  An echo Doppler study in January 2015 revealed a normal systolic function with ejection fraction of 55-60%. There was evidence for mild aortic and mitral insufficiency. His left atrium was moderately dilated.   Mario Palmer was diagnosed with obstructive sleep apnea in 2010. At that time, he was diagnosed with mild sleep apnea with events worse in the supine position and during rim sleep. He has been on CPAP therapy since that time and states initially he was started on an 8 cm water pressure but also this was later titrated to 10 cm.  He admits to 100% compliance.  Over the past 2 weeks, his CPAP has failed and essentially his machine quit working.  Prior to completely stop working.  It was stopping several times per night and started to be at least 4 times per night.  When I saw him 2 months ago, his CPAP machine had broken.  He was compliant 100% of the time.  He subsequently has received a new air sensed 10 CPAP unit.  He has noticed significant improvement in this new machine.  Compared to his old one.  He admits to 100% compliance.  He goes to bed between 9:30 9:45 AM and wakes up once around 2:30 to the bathroom and typically sleeps 9-10 hours per night.  A download was obtained from his new machine from 05/07/2015 through 06/05/2015.  This reveals 100% compliance.  He is averaging 9 hours and 53 minutes per night.  He is set at a 10 cm water pressure.  His AHI  is excellent at 1.1.  He uses a fullface mask.  There is no leak.  He presents for evaluation.  Past Medical History  Diagnosis Date  . Hypertension   . High cholesterol   . Sleep apnea   . GERD (gastroesophageal reflux disease)   . Palpitation     with ecotopy  . Hx of echocardiogram 12/2010    EF 55%, There is Stage 1 diastolic dyfunction. Normal LV filling pressure. Aortic sclerosis with mild AI, mild TR    No past surgical history on file.  No Known Allergies  Current Outpatient Prescriptions  Medication Sig Dispense Refill  . amLODipine (NORVASC) 10 MG tablet Take 10 mg by mouth daily.    Marland Kitchen aspirin 81 MG chewable tablet Chew 81 mg by mouth daily.    . Cholecalciferol (VITAMIN D) 2000 UNITS tablet Take 2,000 Units by mouth daily.    . fluticasone (FLONASE) 50 MCG/ACT nasal spray Place 2 sprays into both nostrils as needed.     . hydrochlorothiazide (HYDRODIURIL) 25 MG tablet Take 25 mg by mouth daily.    Marland Kitchen lisinopril (PRINIVIL,ZESTRIL) 40 MG tablet Take 40 mg by mouth daily.    . nebivolol (BYSTOLIC) 10 MG tablet Take 10 mg by mouth daily.    . NON FORMULARY CPAP therapy    . simvastatin (ZOCOR) 20 MG tablet Take 1 tablet by mouth daily.  No current facility-administered medications for this visit.    Social History   Social History  . Marital Status: Married    Spouse Name: N/A  . Number of Children: N/A  . Years of Education: N/A   Occupational History  . Not on file.   Social History Main Topics  . Smoking status: Former Smoker    Types: Cigarettes  . Smokeless tobacco: Never Used     Comment: quit smoking in 1982  . Alcohol Use: No  . Drug Use: No  . Sexual Activity: Not on file   Other Topics Concern  . Not on file   Social History Narrative    No family history on file.  Social history is notable in that he is married. Has 2 children and 2 grandchildren. 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 There is no tobacco or alcohol  use.  ROS General: Negative; No fevers, chills, or night sweats;  HEENT: Negative; No changes in vision or hearing, sinus congestion, difficulty swallowing Pulmonary: Negative; No cough, wheezing, shortness of breath, hemoptysis Cardiovascular:  Occasional palpitationsNegative; No chest pain, presyncope, syncope, GI: Negative; No nausea, vomiting, diarrhea, or abdominal pain GU: Positive for mild PSA elevation for which he was treated with an antibiotic.  Repeat laboratory will be forthcoming by Dr. Bryon Lions.  No dysuria, hematuria, or difficulty voiding Musculoskeletal: Negative; no myalgias, joint pain, or weakness Hematologic/Oncology: Negative; no easy bruising, bleeding Endocrine: Negative; no heat/cold intolerance; no diabetes Neuro: Negative; no changes in balance, headaches Skin: Negative; No rashes or skin lesions Psychiatric: Negative; No behavioral problems, depression Sleep: Positive for obstructive sleep apnea on CPAP therapy.  No snoring, daytime sleepiness, hypersomnolence, bruxism, restless legs, hypnogognic hallucinations, no cataplexy Other comprehensive 14 point system review is negative.  PE BP 143/82 mmHg  Pulse 67  Ht 5' 10" (1.778 m)  Wt 196 lb 4.8 oz (89.041 kg)  BMI 28.17 kg/m2  Repeat blood pressure when taken by me 140/70  Wt Readings from Last 3 Encounters:  06/08/15 196 lb 4.8 oz (89.041 kg)  04/15/15 194 lb (87.998 kg)  06/08/14 195 lb 4.8 oz (88.587 kg)   General: Alert, oriented, no distress.  Skin: normal turgor, no rashes HEENT: Normocephalic, atraumatic. Pupils round and reactive; sclera anicteric;no lid lag.  Nose without nasal septal hypertrophy Mouth/Parynx benign; Mallinpatti scale 3 Neck: No JVD, no carotid bruits normal carotid upstroke Lungs: clear to ausculatation and percussion; no wheezing or rales Heart: RRR, s1 s2 normal 1/6 systolic murmur. I did not appreciate any significant diastolic murmur. No S3 or S4 gallop. Abdomen:  Umbilical hernia, small.;soft, nontender; no hepatosplenomehaly, BS+; abdominal aorta nontender and not dilated by palpation. Pulses 2+ Extremities: no clubbing cyanosis or edema, Homan's sign negative  Neurologic: grossly nonfocal Psychologic: normal affect and mood.   March 2016ECG (independently read by me): Sinus bradycardia 59 bpm.  Mild RV conduction delay.  Normal intervals.  March 2015 ECG: Normal sinus rhythm at 60 beats per minute. No ectopy. Nonspecific T changes in lead 3.  Prior 02/04/2013 ECG: Sinus bradycardia with PACs.  LABS:  BMET  BMP Latest Ref Rng 06/03/2011 06/03/2011  Glucose 70 - 99 mg/dL 117(H) 117(H)  BUN 6 - 23 mg/dL 20 16  Creatinine 0.50 - 1.35 mg/dL 1.00 0.79  Sodium 135 - 145 mEq/L 143 139  Potassium 3.5 - 5.1 mEq/L 3.1(L) 3.5  Chloride 96 - 112 mEq/L 103 101  CO2 19 - 32 mEq/L - 24  Calcium 8.4 -  10.5 mg/dL - 9.4    Hepatic Function Latest Ref Rng 06/03/2011  Total Protein 6.0 - 8.3 g/dL 6.9  Albumin 3.5 - 5.2 g/dL 3.9  AST 0 - 37 U/L 30  ALT 0 - 53 U/L 23  Alk Phosphatase 39 - 117 U/L 59  Total Bilirubin 0.3 - 1.2 mg/dL 0.8   CBC Latest Ref Rng 06/03/2011 06/03/2011  WBC 4.0 - 10.5 K/uL - 16.7(H)  Hemoglobin 13.0 - 17.0 g/dL 14.3 14.4  Hematocrit 39.0 - 52.0 % 42.0 40.9  Platelets 150 - 400 K/uL - 228   Lab Results  Component Value Date   MCV 87.4 06/03/2011   No results found for: TSH   BNP No results found for: PROBNP  Lipid Panel  No results found for: CHOL   RADIOLOGY: No results found.    ASSESSMENT AND PLAN: Mario Palmer is a 75 years old gentleman who has  a history of hypertension, sleep apnea, palpitations with documented atrial ectopy, and hyperlipidemia.  His blood pressure today is fairly well controlled at 140/70 on repeat by me on Bystolic 10 mg, lisinopril 40 mg, HCTZ 25 mg, and amlodipine 10 mg.  he has been on CPAP therapy since 2010 and recently was started with a new ResMed air since 10 CPAP machine.  His  compliance is outstanding.  He is averaging just less than 10 hours of sleep per night.  His AHI is excellent.  There is no male mask leak.  He denies breakthrough snoring or hypersomnolence.  He is meeting Medicare compliance standards.  His DME his [facility].  As long as he remains stable I will see him in one year for reevaluation.   Troy Sine, MD, [facility]  06/10/2015 9:30 PM

## 2015-06-13 DIAGNOSIS — H2512 Age-related nuclear cataract, left eye: Secondary | ICD-10-CM | POA: Diagnosis not present

## 2015-06-14 DIAGNOSIS — H2511 Age-related nuclear cataract, right eye: Secondary | ICD-10-CM | POA: Diagnosis not present

## 2015-06-14 DIAGNOSIS — H25811 Combined forms of age-related cataract, right eye: Secondary | ICD-10-CM | POA: Diagnosis not present

## 2015-06-17 DIAGNOSIS — G4733 Obstructive sleep apnea (adult) (pediatric): Secondary | ICD-10-CM | POA: Diagnosis not present

## 2015-06-28 DIAGNOSIS — H25812 Combined forms of age-related cataract, left eye: Secondary | ICD-10-CM | POA: Diagnosis not present

## 2015-06-28 DIAGNOSIS — H2512 Age-related nuclear cataract, left eye: Secondary | ICD-10-CM | POA: Diagnosis not present

## 2015-07-12 DIAGNOSIS — I10 Essential (primary) hypertension: Secondary | ICD-10-CM | POA: Diagnosis not present

## 2015-07-12 DIAGNOSIS — G4730 Sleep apnea, unspecified: Secondary | ICD-10-CM | POA: Diagnosis not present

## 2015-07-12 DIAGNOSIS — R972 Elevated prostate specific antigen [PSA]: Secondary | ICD-10-CM | POA: Diagnosis not present

## 2015-07-12 DIAGNOSIS — Z79899 Other long term (current) drug therapy: Secondary | ICD-10-CM | POA: Diagnosis not present

## 2015-07-12 DIAGNOSIS — E784 Other hyperlipidemia: Secondary | ICD-10-CM | POA: Diagnosis not present

## 2015-07-18 DIAGNOSIS — G4733 Obstructive sleep apnea (adult) (pediatric): Secondary | ICD-10-CM | POA: Diagnosis not present

## 2015-07-28 DIAGNOSIS — H35351 Cystoid macular degeneration, right eye: Secondary | ICD-10-CM | POA: Diagnosis not present

## 2015-07-28 DIAGNOSIS — H35373 Puckering of macula, bilateral: Secondary | ICD-10-CM | POA: Diagnosis not present

## 2015-08-17 DIAGNOSIS — G4733 Obstructive sleep apnea (adult) (pediatric): Secondary | ICD-10-CM | POA: Diagnosis not present

## 2015-08-20 DIAGNOSIS — H35373 Puckering of macula, bilateral: Secondary | ICD-10-CM | POA: Diagnosis not present

## 2015-09-02 DIAGNOSIS — Z961 Presence of intraocular lens: Secondary | ICD-10-CM | POA: Diagnosis not present

## 2015-09-02 DIAGNOSIS — H35371 Puckering of macula, right eye: Secondary | ICD-10-CM | POA: Diagnosis not present

## 2015-09-02 DIAGNOSIS — H35372 Puckering of macula, left eye: Secondary | ICD-10-CM | POA: Diagnosis not present

## 2015-09-17 DIAGNOSIS — G4733 Obstructive sleep apnea (adult) (pediatric): Secondary | ICD-10-CM | POA: Diagnosis not present

## 2015-10-17 DIAGNOSIS — G4733 Obstructive sleep apnea (adult) (pediatric): Secondary | ICD-10-CM | POA: Diagnosis not present

## 2015-11-02 DIAGNOSIS — H35372 Puckering of macula, left eye: Secondary | ICD-10-CM | POA: Diagnosis not present

## 2015-11-02 DIAGNOSIS — H35371 Puckering of macula, right eye: Secondary | ICD-10-CM | POA: Diagnosis not present

## 2015-11-17 DIAGNOSIS — G4733 Obstructive sleep apnea (adult) (pediatric): Secondary | ICD-10-CM | POA: Diagnosis not present

## 2015-12-18 DIAGNOSIS — G4733 Obstructive sleep apnea (adult) (pediatric): Secondary | ICD-10-CM | POA: Diagnosis not present

## 2016-01-04 DIAGNOSIS — G4733 Obstructive sleep apnea (adult) (pediatric): Secondary | ICD-10-CM | POA: Diagnosis not present

## 2016-01-04 DIAGNOSIS — H35371 Puckering of macula, right eye: Secondary | ICD-10-CM | POA: Diagnosis not present

## 2016-01-04 DIAGNOSIS — H35372 Puckering of macula, left eye: Secondary | ICD-10-CM | POA: Diagnosis not present

## 2016-01-17 DIAGNOSIS — G4733 Obstructive sleep apnea (adult) (pediatric): Secondary | ICD-10-CM | POA: Diagnosis not present

## 2016-01-20 DIAGNOSIS — R972 Elevated prostate specific antigen [PSA]: Secondary | ICD-10-CM | POA: Diagnosis not present

## 2016-01-20 DIAGNOSIS — Z79899 Other long term (current) drug therapy: Secondary | ICD-10-CM | POA: Diagnosis not present

## 2016-01-20 DIAGNOSIS — Z0000 Encounter for general adult medical examination without abnormal findings: Secondary | ICD-10-CM | POA: Diagnosis not present

## 2016-01-20 DIAGNOSIS — G4730 Sleep apnea, unspecified: Secondary | ICD-10-CM | POA: Diagnosis not present

## 2016-01-20 DIAGNOSIS — E784 Other hyperlipidemia: Secondary | ICD-10-CM | POA: Diagnosis not present

## 2016-01-20 DIAGNOSIS — I10 Essential (primary) hypertension: Secondary | ICD-10-CM | POA: Diagnosis not present

## 2016-06-07 ENCOUNTER — Encounter: Payer: Self-pay | Admitting: Cardiovascular Disease

## 2016-06-07 ENCOUNTER — Ambulatory Visit (INDEPENDENT_AMBULATORY_CARE_PROVIDER_SITE_OTHER): Payer: Medicare Other | Admitting: Cardiovascular Disease

## 2016-06-07 VITALS — BP 120/80 | HR 63 | Ht 69.0 in | Wt 200.0 lb

## 2016-06-07 DIAGNOSIS — I480 Paroxysmal atrial fibrillation: Secondary | ICD-10-CM

## 2016-06-07 DIAGNOSIS — R972 Elevated prostate specific antigen [PSA]: Secondary | ICD-10-CM | POA: Diagnosis not present

## 2016-06-07 DIAGNOSIS — Z9989 Dependence on other enabling machines and devices: Secondary | ICD-10-CM

## 2016-06-07 DIAGNOSIS — I10 Essential (primary) hypertension: Secondary | ICD-10-CM | POA: Diagnosis not present

## 2016-06-07 DIAGNOSIS — Z5181 Encounter for therapeutic drug level monitoring: Secondary | ICD-10-CM | POA: Diagnosis not present

## 2016-06-07 DIAGNOSIS — G4733 Obstructive sleep apnea (adult) (pediatric): Secondary | ICD-10-CM

## 2016-06-07 DIAGNOSIS — Z7901 Long term (current) use of anticoagulants: Secondary | ICD-10-CM

## 2016-06-07 LAB — COMPREHENSIVE METABOLIC PANEL
ALBUMIN: 4.2 g/dL (ref 3.6–5.1)
ALT: 18 U/L (ref 9–46)
AST: 18 U/L (ref 10–35)
Alkaline Phosphatase: 54 U/L (ref 40–115)
BUN: 14 mg/dL (ref 7–25)
CALCIUM: 9.8 mg/dL (ref 8.6–10.3)
CHLORIDE: 104 mmol/L (ref 98–110)
CO2: 28 mmol/L (ref 20–31)
Creat: 0.85 mg/dL (ref 0.70–1.18)
GLUCOSE: 76 mg/dL (ref 65–99)
Potassium: 4 mmol/L (ref 3.5–5.3)
SODIUM: 143 mmol/L (ref 135–146)
Total Bilirubin: 1.3 mg/dL — ABNORMAL HIGH (ref 0.2–1.2)
Total Protein: 7.1 g/dL (ref 6.1–8.1)

## 2016-06-07 LAB — LIPID PANEL
CHOL/HDL RATIO: 3 ratio (ref <5.0)
CHOLESTEROL: 160 mg/dL (ref <200)
HDL: 53 mg/dL (ref >40)
LDL CALC: 79 mg/dL (ref <100)
TRIGLYCERIDES: 138 mg/dL (ref <150)
VLDL: 28 mg/dL (ref <30)

## 2016-06-07 LAB — MAGNESIUM: Magnesium: 2.1 mg/dL (ref 1.5–2.5)

## 2016-06-07 LAB — CBC
HEMATOCRIT: 46.1 % (ref 38.5–50.0)
HEMOGLOBIN: 15.5 g/dL (ref 13.2–17.1)
MCH: 30.9 pg (ref 27.0–33.0)
MCHC: 33.6 g/dL (ref 32.0–36.0)
MCV: 91.8 fL (ref 80.0–100.0)
MPV: 10.5 fL (ref 7.5–12.5)
Platelets: 206 10*3/uL (ref 140–400)
RBC: 5.02 MIL/uL (ref 4.20–5.80)
RDW: 13.8 % (ref 11.0–15.0)
WBC: 6.7 10*3/uL (ref 3.8–10.8)

## 2016-06-07 LAB — TSH: TSH: 2.23 m[IU]/L (ref 0.40–4.50)

## 2016-06-07 MED ORDER — APIXABAN 5 MG PO TABS: 5.0000 mg | ORAL_TABLET | Freq: Two times a day (BID) | ORAL | 0 refills | Status: DC

## 2016-06-07 MED ORDER — DILTIAZEM HCL ER COATED BEADS 180 MG PO CP24: 180.0000 mg | ORAL_CAPSULE | Freq: Every day | ORAL | 3 refills | Status: DC

## 2016-06-07 NOTE — Patient Instructions (Addendum)
Your physician has recommended you make the following change in your medication:   1.) STOP amlodipine. This has been replaced with diltizem.  2.) start new prescription for Eliquis 5 mg.  Your physician recommends that you return for lab work TODAY.  Your physician has requested that you have an echocardiogram. Echocardiography is a painless test that uses sound waves to create images of your heart. It provides your doctor with information about the size and shape of your heart and how well your heart's chambers and valves are working. This procedure takes approximately one hour. There are no restrictions for this procedure. This will be done at the [facility] unless requested elsewhere.  Your physician recommends that you schedule a follow-up appointment in: 3-4 weeks with Dr Claiborne Billings.  IF YOUR PULSE GETS LOWER THAN 54 THEN CUT THE BYSTOLIC TO 1/2 TABLET.

## 2016-06-08 NOTE — Progress Notes (Signed)
Patient ID: Mario Palmer, male   DOB: [birth year], 76 y.o.   MRN: [id number]    Primary M.D.: Dr. Bryon Lions  HPI: Mario Palmer is a 76 y.o. male who presents for a 12 month follow-up evaluation.  Mario Palmer has a history of hypertension, obstructive sleep apnea on CPAP therapy, GERD, and palpitations with atrial ectopy,. 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 He has history of hyperlipidemia and has been on simvastatin and had intermittently stopped this secondary to arthralgias.  An echo Doppler study in January 2015 revealed a normal systolic function with ejection fraction of 55-60%. There was evidence for mild aortic and mitral insufficiency. His left atrium was moderately dilated.   Mario Palmer was diagnosed with obstructive sleep apnea in 2010. At that time, he was diagnosed with mild sleep apnea with events worse in the supine position and during rim sleep. He has been on CPAP therapy since that time and states initially he was started on an 8 cm water pressure but also this was later titrated to 10 cm.  He admits to 100% compliance.  Over the past 2 weeks, his CPAP has failed and essentially his machine quit working.  Prior to completely stop working.  It was stopping several times per night and started to be at least 4 times per night.  When I saw him 2 months ago, his CPAP machine had broken.  He was compliant 100% of the time.  He subsequently has received a new air sensed 10 CPAP unit.  He has noticed significant improvement in this new machine.  Compared to his old one.  He admits to 100% compliance.  He goes to bed between 9:30 9:45 AM and wakes up once around 2:30 to the bathroom and typically sleeps 9-10 hours per night.  A download was obtained from his new machine from 05/07/2015 through 06/05/2015.  This reveals 100% compliance.  He is averaging 9 hours and 53 minutes per night.  He is set at a 10 cm water pressure.  His AHI was excellent at 1.1.  He uses a  fullface mask.  There was no leak.    Since I last saw him, he has felt fairly well.  He admits to using CPAP 100% of the time and typically goes to bed between 9:30 and 10 AM and wakes up between 7:30 and 8:30 AM.  His DME company is [facility].  A rhythm disturbance.  He denies episodes of chest pressure.  Add blood work done in October 2017 by Dr. Laurance Flatten in 2 days to be followed by Bryon Lions.  His LDL was slightly elevated at 102.  His PSA was slightly elevated but improved and he never did see a urologist.  He presents for evaluation.  Past Medical History:  Diagnosis Date  . GERD (gastroesophageal reflux disease)   . High cholesterol   . Hx of echocardiogram 12/2010   EF 55%, There is Stage 1 diastolic dyfunction. Normal LV filling pressure. Aortic sclerosis with mild AI, mild TR  . Hypertension   . Palpitation    with ecotopy  . Sleep apnea     No past surgical history on file.  No Known Allergies  Current Outpatient Prescriptions  Medication Sig Dispense Refill  . aspirin 81 MG chewable tablet Chew 81 mg by mouth daily.    . Cholecalciferol (VITAMIN D) 2000 UNITS tablet Take 2,000 Units by mouth daily.    . fluticasone (FLONASE) 50 MCG/ACT  nasal spray Place 2 sprays into both nostrils as needed.     . hydrochlorothiazide (HYDRODIURIL) 25 MG tablet Take 25 mg by mouth daily.    Marland Kitchen lisinopril (PRINIVIL,ZESTRIL) 40 MG tablet Take 40 mg by mouth daily.    . nebivolol (BYSTOLIC) 10 MG tablet Take 10 mg by mouth daily.    . NON FORMULARY CPAP therapy    . simvastatin (ZOCOR) 20 MG tablet Take 1 tablet by mouth daily.    Marland Kitchen apixaban (ELIQUIS) 5 MG TABS tablet Take 1 tablet (5 mg total) by mouth 2 (two) times daily. 60 tablet 0  . diltiazem (CARDIZEM CD) 180 MG 24 hr capsule Take 1 capsule (180 mg total) by mouth daily. 30 capsule 3   No current facility-administered medications for this visit.     Social History   Social History  . Marital status: Married    Spouse  name: N/A  . Number of children: N/A  . Years of education: N/A   Occupational History  . Not on file.   Social History Main Topics  . Smoking status: Former Smoker    Types: Cigarettes  . Smokeless tobacco: Never Used     Comment: quit smoking in 1982  . Alcohol use No  . Drug use: No  . Sexual activity: Not on file   Other Topics Concern  . Not on file   Social History Narrative  . No narrative on file    No family history on file.  Social history is notable in that he is married; 2 children and 2 grandchildren. 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 There is no tobacco or alcohol use.  ROS General: Negative; No fevers, chills, or night sweats;  HEENT: Negative; No changes in vision or hearing, sinus congestion, difficulty swallowing Pulmonary: Negative; No cough, wheezing, shortness of breath, hemoptysis Cardiovascular:  Occasional palpitationsNegative; No chest pain, presyncope, syncope, GI: Negative; No nausea, vomiting, diarrhea, or abdominal pain GU: Positive for mild PSA elevation for which he was treated with an antibiotic.  Repeat laboratory will be forthcoming by Dr. Bryon Lions.  No dysuria, hematuria, or difficulty voiding Musculoskeletal: Negative; no myalgias, joint pain, or weakness Hematologic/Oncology: Negative; no easy bruising, bleeding Endocrine: Negative; no heat/cold intolerance; no diabetes Neuro: Negative; no changes in balance, headaches Skin: Negative; No rashes or skin lesions Psychiatric: Negative; No behavioral problems, depression Sleep: Positive for obstructive sleep apnea on CPAP therapy.  No snoring, daytime sleepiness, hypersomnolence, bruxism, restless legs, hypnogognic hallucinations, no cataplexy Other comprehensive 14 point system review is negative.  PE BP 120/80   Pulse 63   Ht '5\' 9"'  (1.753 m)   Wt 200 lb (90.7 kg)   BMI 29.53 kg/m   Repeat blood pressure when taken by me 140/70  Wt Readings from Last 3  Encounters:  06/07/16 200 lb (90.7 kg)  06/08/15 196 lb 4.8 oz (89 kg)  04/15/15 194 lb (88 kg)   General: Alert, oriented, no distress.  Skin: normal turgor, no rashes HEENT: Normocephalic, atraumatic. Pupils round and reactive; sclera anicteric;no lid lag.  Nose without nasal septal hypertrophy Mouth/Parynx benign; Mallinpatti scale 3 Neck: No JVD, no carotid bruits normal carotid upstroke Lungs: clear to ausculatation and percussion; no wheezing or rales Heart: Irregular irregular, s1 s2 normal 1/6 systolic murmur. I did not appreciate any significant diastolic murmur. No S3 or S4 gallop. Abdomen: Umbilical hernia, small.;soft, nontender; no hepatosplenomehaly, BS+; abdominal aorta nontender and not dilated by palpation. Pulses 2+ Extremities:  no clubbing cyanosis or edema, Homan's sign negative  Neurologic: grossly nonfocal Psychologic: normal affect and mood.   ECG (independently read by me): Atrial fibrillation at 63 bpm.  QTc interval 427 ms.  March 2016 ECG (independently read by me): Sinus bradycardia 59 bpm.  Mild RV conduction delay.  Normal intervals.  March 2015 ECG: Normal sinus rhythm at 60 beats per minute. No ectopy. Nonspecific T changes in lead 3.  Prior 02/04/2013 ECG: Sinus bradycardia with PACs.  LABS:  BMET  BMP Latest Ref Rng & Units 06/07/2016 06/03/2011 06/03/2011  Glucose 65 - 99 mg/dL 76 117(H) 117(H)  BUN 7 - 25 mg/dL '14 20 16  ' Creatinine 0.70 - 1.18 mg/dL 0.85 1.00 0.79  Sodium 135 - 146 mmol/L 143 143 139  Potassium 3.5 - 5.3 mmol/L 4.0 3.1(L) 3.5  Chloride 98 - 110 mmol/L 104 103 101  CO2 20 - 31 mmol/L 28 - 24  Calcium 8.6 - 10.3 mg/dL 9.8 - 9.4    Hepatic Function Latest Ref Rng & Units 06/07/2016 06/03/2011  Total Protein 6.1 - 8.1 g/dL 7.1 6.9  Albumin 3.6 - 5.1 g/dL 4.2 3.9  AST 10 - 35 U/L 18 30  ALT 9 - 46 U/L 18 23  Alk Phosphatase 40 - 115 U/L 54 59  Total Bilirubin 0.2 - 1.2 mg/dL 1.3(H) 0.8   CBC Latest Ref Rng & Units 06/07/2016  06/03/2011 06/03/2011  WBC 3.8 - 10.8 K/uL 6.7 - 16.7(H)  Hemoglobin 13.2 - 17.1 g/dL 15.5 14.3 14.4  Hematocrit 38.5 - 50.0 % 46.1 42.0 40.9  Platelets 140 - 400 K/uL 206 - 228   Lab Results  Component Value Date   MCV 91.8 06/07/2016   MCV 87.4 06/03/2011   Lab Results  Component Value Date   TSH 2.23 06/07/2016     BNP No results found for: PROBNP  Lipid Panel     Component Value Date/Time   CHOL 160 06/07/2016 1030     RADIOLOGY: No results found.  IMPRESSION:   1. Paroxysmal atrial fibrillation (HCC)   2. Hyperlipidemia   3. Essential hypertension   4. OSA on CPAP   5. Anticoagulation management encounter   6. PSA elevation     ASSESSMENT AND PLAN: Mr. Levelle Palmer is a 76 years old gentleman who has a history of hypertension, obstructive sleep apnea, palpitations with documented atrial ectopy, and hyperlipidemia.  Pressure today is controlled on amlodipine 10 mg, HCTZ 25 mg, lisinopril 40 mg, and Bystolic 10 mg.  However, his ECG demonstrates that he is in atrial fibrillation of questionable duration.  As result, I am recommending discontinuance of amlodipine and I will change this to Cardizem CD 180 mg.  I am starting him on anticoagulation and discussed this in detail with him.  I will initiate eliquis 5 mg twice a day.  He will continue taking Bystolic at the present dose, but if his pulse gets below 54.  He will reduce this to 5 mg.  He continues to be on simvastatin at 20 mg for hyperlipidemia. I am scheduling him for and Echo Doppler study in light of his atrial fibrillation.  He has been on CPAP therapy since 2010 and last year received a new ResMed AirSense10 CPAP machine.  His compliance is outstanding.   He denies breakthrough snoring or hypersomnolence.  He is meeting Medicare compliance standards.  His DME his [facility].  Has been demonstrated to have PSA elevation, although his most recent level was improved.  He has never followed up with  urologic evaluation and I have suggested that he arrange for urologic assessment.  I will see him back in the office in 3-4 weeks for reevaluation.     Troy Sine, MD, [facility]  06/09/2016 7:31 PM

## 2016-06-23 ENCOUNTER — Ambulatory Visit (HOSPITAL_COMMUNITY): Payer: Medicare Other | Attending: Cardiovascular Disease

## 2016-06-23 ENCOUNTER — Other Ambulatory Visit: Payer: Self-pay

## 2016-06-23 DIAGNOSIS — I371 Nonrheumatic pulmonary valve insufficiency: Secondary | ICD-10-CM | POA: Diagnosis not present

## 2016-06-23 DIAGNOSIS — I340 Nonrheumatic mitral (valve) insufficiency: Secondary | ICD-10-CM | POA: Insufficient documentation

## 2016-06-23 DIAGNOSIS — I480 Paroxysmal atrial fibrillation: Secondary | ICD-10-CM | POA: Diagnosis not present

## 2016-06-23 DIAGNOSIS — I420 Dilated cardiomyopathy: Secondary | ICD-10-CM | POA: Insufficient documentation

## 2016-06-23 DIAGNOSIS — I4891 Unspecified atrial fibrillation: Secondary | ICD-10-CM | POA: Diagnosis present

## 2016-06-23 DIAGNOSIS — I351 Nonrheumatic aortic (valve) insufficiency: Secondary | ICD-10-CM | POA: Diagnosis not present

## 2016-07-05 ENCOUNTER — Other Ambulatory Visit: Payer: Self-pay | Admitting: Cardiovascular Disease

## 2016-07-10 ENCOUNTER — Encounter: Payer: Self-pay | Admitting: *Deleted

## 2016-07-11 DIAGNOSIS — H35371 Puckering of macula, right eye: Secondary | ICD-10-CM | POA: Diagnosis not present

## 2016-07-11 DIAGNOSIS — H35372 Puckering of macula, left eye: Secondary | ICD-10-CM | POA: Diagnosis not present

## 2016-07-13 ENCOUNTER — Encounter: Payer: Self-pay | Admitting: Cardiovascular Disease

## 2016-07-13 ENCOUNTER — Ambulatory Visit (INDEPENDENT_AMBULATORY_CARE_PROVIDER_SITE_OTHER): Payer: Medicare Other | Admitting: Cardiovascular Disease

## 2016-07-13 VITALS — BP 120/68 | HR 58 | Ht 69.0 in | Wt 201.0 lb

## 2016-07-13 DIAGNOSIS — I481 Persistent atrial fibrillation: Secondary | ICD-10-CM

## 2016-07-13 DIAGNOSIS — Z9989 Dependence on other enabling machines and devices: Secondary | ICD-10-CM

## 2016-07-13 DIAGNOSIS — Z7901 Long term (current) use of anticoagulants: Secondary | ICD-10-CM | POA: Diagnosis not present

## 2016-07-13 DIAGNOSIS — I4819 Other persistent atrial fibrillation: Secondary | ICD-10-CM

## 2016-07-13 DIAGNOSIS — I10 Essential (primary) hypertension: Secondary | ICD-10-CM | POA: Diagnosis not present

## 2016-07-13 DIAGNOSIS — G4733 Obstructive sleep apnea (adult) (pediatric): Secondary | ICD-10-CM

## 2016-07-13 MED ORDER — AMIODARONE HCL 200 MG PO TABS: 200.0000 mg | ORAL_TABLET | Freq: Two times a day (BID) | ORAL | 6 refills | Status: DC

## 2016-07-13 MED ORDER — AMIODARONE HCL 200 MG PO TABS: 200.0000 mg | ORAL_TABLET | Freq: Every day | ORAL | 6 refills | Status: DC

## 2016-07-13 NOTE — Patient Instructions (Addendum)
Medication Instructions:   1.) the Bystolic has been decreased to 5 mg daily ( 1/2 tablet).  2.) start new amiodarone prescription as directed. Take 1 tablet daily for the first week then increase to 1 tablet twice a day.  Labwork:  NONE   Testing/Procedures:  NONE  Follow-Up:  4 WEEKS with Dr Claiborne Billings  Any Other Special Instructions Will Be Listed Below (If Applicable).

## 2016-07-13 NOTE — Progress Notes (Signed)
Patient ID: WILLLIAM PETTET, male   DOB: 23-Feb-1941, 77 y.o.   MRN: [id number]    Primary M.D.: Dr. Bryon Lions  HPI: Mario Palmer is a 76 y.o. male who presents for a one month follow-up evaluation.  Mario Palmer has a history of hypertension, obstructive sleep apnea on CPAP therapy, GERD, and palpitations with atrial ectopy,. 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 He has history of hyperlipidemia and has been on simvastatin and had intermittently stopped this secondary to arthralgias.  An echo Doppler study in January 2015 revealed a normal systolic function with ejection fraction of 55-60%. There was evidence for mild aortic and mitral insufficiency. His left atrium was moderately dilated.   Mario Palmer was diagnosed with obstructive sleep apnea in 2010. He had mild sleep apnea with events worse in the supine position and during REM sleep. He has been on CPAP therapy since that time and initially he was started on an 8 cm water pressure but also this was later titrated to 10 cm.  He admits to 100% compliance.  His initial machine malfunctioned in 2017 and  He received a new AirSense 10 CPAP unit.  He has noticed significant improvement in this new machine.  Compared to his old one.  He admits to 100% compliance.  He goes to bed between 9:30 9:45 AM and wakes up once around 2:30 to the bathroom and typically sleeps 9-10 hours per night.  A download was obtained from his new machine from 05/07/2015 through 06/05/2015.  This reveals 100% compliance.  He is averaging 9 hours and 53 minutes per night.  He is set at a 10 cm water pressure.  His AHI was excellent at 1.1.  He uses a fullface mask.  There was no leak.    He admits to using CPAP 100% of the time and typically goes to bed between 9:30 and 10 AM and wakes up between 7:30 and 8:30 AM.  His DME company is [facility].  A rhythm disturbance.  He denies episodes of chest pressure.  Add blood work done in October 2017 by  Dr. Laurance Flatten in 2 days to be followed by Bryon Lions.  His LDL was slightly elevated at 102.  His PSA was slightly elevated but improved and he never did see a urologist.    When I saw him last month for one-year evaluation, his ECG demonstrated atrial fibrillation.  He was in this of questionable duration.  At that time, I initiated anticoagulation with eliquis  5 mg twice a day.  I discontinued amlodipine and started Cardizem CD 180 mg.  He underwent an echo Doppler study on 06/23/2016 which showed an EF of 55-60%.  He did not have wall motion abnormalities.  There was mild aortic sclerosis with mildAR and mild focal calcification of the anterior mitral valve leaflet with trivial MR.  His right ventricle is mildly dilated.  His left atrium was severely dilated and measured 64 mm parasternally.  Mario Palmer is unaware of his irregular rhythm.  He denies chest pain.  He does admit to more fatigue.  He continues to use CPAP with 100% compliance.  He presents for reevaluation.   Past Medical History:  Diagnosis Date  . GERD (gastroesophageal reflux disease)   . High cholesterol   . Hx of echocardiogram 12/2010   EF 55%, There is Stage 1 diastolic dyfunction. Normal LV filling pressure. Aortic sclerosis with mild AI, mild TR  . Hypertension   .  Palpitation    with ecotopy  . Sleep apnea     History reviewed. No pertinent surgical history.  No Known Allergies  Current Outpatient Prescriptions  Medication Sig Dispense Refill  . aspirin 81 MG chewable tablet Chew 81 mg by mouth daily.    . Cholecalciferol (VITAMIN D) 2000 UNITS tablet Take 2,000 Units by mouth daily.    Marland Kitchen diltiazem (CARDIZEM CD) 180 MG 24 hr capsule Take 1 capsule (180 mg total) by mouth daily. 30 capsule 3  . ELIQUIS 5 MG TABS tablet TAKE 1 TABLET BY MOUTH TWICE A DAY  180 tablet 1  . hydrochlorothiazide (HYDRODIURIL) 25 MG tablet Take 25 mg by mouth daily.    Marland Kitchen lisinopril (PRINIVIL,ZESTRIL) 40 MG tablet Take 40 mg by mouth  daily.    . nebivolol (BYSTOLIC) 10 MG tablet Take 5 mg by mouth daily.    . NON FORMULARY CPAP therapy    . simvastatin (ZOCOR) 20 MG tablet Take 1 tablet by mouth daily.    Marland Kitchen amiodarone (PACERONE) 200 MG tablet Take 1 tablet (200 mg total) by mouth 2 (two) times daily. 60 tablet 6   No current facility-administered medications for this visit.     Social History   Social History  . Marital status: Married    Spouse name: N/A  . Number of children: N/A  . Years of education: N/A   Occupational History  . Not on file.   Social History Main Topics  . Smoking status: Former Smoker    Types: Cigarettes  . Smokeless tobacco: Never Used     Comment: quit smoking in 1982  . Alcohol use No  . Drug use: No  . Sexual activity: Not on file   Other Topics Concern  . Not on file   Social History Narrative  . No narrative on file    History reviewed. No pertinent family history.  Social history is notable in that he is married; 2 children and 2 grandchildren. 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 There is no tobacco or alcohol use.  ROS General: Negative; No fevers, chills, or night sweats;  HEENT: Negative; No changes in vision or hearing, sinus congestion, difficulty swallowing Pulmonary: Negative; No cough, wheezing, shortness of breath, hemoptysis Cardiovascular:  Negative; No chest pain, presyncope, syncope, GI: Negative; No nausea, vomiting, diarrhea, or abdominal pain GU: Positive for mild PSA elevation for which he was treated with an antibiotic.  Repeat laboratory will be forthcoming by Dr. Bryon Lions.  No dysuria, hematuria, or difficulty voiding Musculoskeletal: Negative; no myalgias, joint pain, or weakness Hematologic/Oncology: Negative; no easy bruising, bleeding Endocrine: Negative; no heat/cold intolerance; no diabetes Neuro: Negative; no changes in balance, headaches Skin: Negative; No rashes or skin lesions Psychiatric: Negative; No behavioral  problems, depression Sleep: Positive for obstructive sleep apnea on CPAP therapy.  No snoring, daytime sleepiness, hypersomnolence, bruxism, restless legs, hypnogognic hallucinations, no cataplexy Other comprehensive 14 point system review is negative.  PE BP 120/68   Pulse (!) 58   Ht _0  (1.753 m)   Wt 201 lb (91.2 kg)   BMI 29.68 kg/m   Repeat blood pressure when taken by me 138/78  Wt Readings from Last 3 Encounters:  07/13/16 201 lb (91.2 kg)  06/07/16 200 lb (90.7 kg)  06/08/15 196 lb 4.8 oz (89 kg)   General: Alert, oriented, no distress.  Skin: normal turgor, no rashes HEENT: Normocephalic, atraumatic. Pupils round and reactive; sclera anicteric;no lid lag.  Nose without nasal septal hypertrophy Mouth/Parynx benign; Mallinpatti scale 3 Neck: No JVD, no carotid bruits normal carotid upstroke Lungs: clear to ausculatation and percussion; no wheezing or rales Heart: Irregular irregular, s1 s2 normal 1/6 systolic murmur. I did not appreciate any significant diastolic murmur. No S3 or S4 gallop. Abdomen: Umbilical hernia, small.;soft, nontender; no hepatosplenomehaly, BS+; abdominal aorta nontender and not dilated by palpation. Pulses 2+ Extremities: no clubbing cyanosis or edema, Homan's sign negative  Neurologic: grossly nonfocal Psychologic: normal affect and mood.   ECG (independently read by me): Atrial fibrillation with a ventricular rate in the 50s.  Mild RV conduction delay.  March 2018 ECG (independently read by me): Atrial fibrillation at 63 bpm.  QTc interval 427 ms.  March 2016 ECG (independently read by me): Sinus bradycardia 59 bpm.  Mild RV conduction delay.  Normal intervals.  March 2015 ECG: Normal sinus rhythm at 60 beats per minute. No ectopy. Nonspecific T changes in lead 3.  Prior 02/04/2013 ECG: Sinus bradycardia with PACs.  LABS:  BMP Latest Ref Rng & Units 06/07/2016 06/03/2011 06/03/2011  Glucose 65 - 99 mg/dL 76 117(H) 117(H)  BUN 7 - 25 mg/dL _0 Creatinine 0.70 - 1.18 mg/dL 0.85 1.00 0.79  Sodium 135 - 146 mmol/L 143 143 139  Potassium 3.5 - 5.3 mmol/L 4.0 3.1(L) 3.5  Chloride 98 - 110 mmol/L 104 103 101  CO2 20 - 31 mmol/L 28 - 24  Calcium 8.6 - 10.3 mg/dL 9.8 - 9.4    Hepatic Function Latest Ref Rng & Units 06/07/2016 06/03/2011  Total Protein 6.1 - 8.1 g/dL 7.1 6.9  Albumin 3.6 - 5.1 g/dL 4.2 3.9  AST 10 - 35 U/L 18 30  ALT 9 - 46 U/L 18 23  Alk Phosphatase 40 - 115 U/L 54 59  Total Bilirubin 0.2 - 1.2 mg/dL 1.3(H) 0.8   CBC Latest Ref Rng & Units 06/07/2016 06/03/2011 06/03/2011  WBC 3.8 - 10.8 K/uL 6.7 - 16.7(H)  Hemoglobin 13.2 - 17.1 g/dL 15.5 14.3 14.4  Hematocrit 38.5 - 50.0 % 46.1 42.0 40.9  Platelets 140 - 400 K/uL 206 - 228   Lab Results  Component Value Date   MCV 91.8 06/07/2016   MCV 87.4 06/03/2011   Lab Results  Component Value Date   TSH 2.23 06/07/2016     BNP No results found for: PROBNP  Lipid Panel     Component Value Date/Time   CHOL 160 06/07/2016 1030   TRIG 138 06/07/2016 1030   HDL 53 06/07/2016 1030   CHOLHDL 3.0 06/07/2016 1030   VLDL 28 06/07/2016 1030   LDLCALC 79 06/07/2016 1030    RADIOLOGY: No results found.  IMPRESSION:   1. Essential hypertension   2. Persistent atrial fibrillation ([place])   3. Hyperlipidemia   4. Anticoagulated   5. OSA on CPAP     ASSESSMENT AND PLAN: Mario Palmer is a 76 years old gentleman who has a history of hypertension, obstructive sleep apnea, palpitations with documented atrial ectopy, and hyperlipidemia.   He has been on CPAP therapy since 2010 and last year received a new ResMed AirSense10 CPAP machine.  His compliance is outstanding.   He denies breakthrough snoring or hypersomnolence.  He is meeting Medicare compliance standards.  His DME his [facility].  When I saw him last month, his ECG demonstrated atrial fibrillation.  The last ECG that I was able to see was from 2 years previously which showed sinus  rhythm.  He  believes he may have had an ECG done by his primary physician.  More recently.  When I last saw him, I discontinued amlodipine and started Cardizem CD in light of his atrial fibrillation.  He is on anticoagulation and denies bleeding on eliquis I reviewed his echo Doppler data.  LV function is normal.  I'm concerned that his left atrium is severely dilated, which is an independent risk factor for recurrent AF.  Following restoration of sinus rhythm.  However, since he has not had a previous attempt at restoration of sinus rhythm.  I feel it may be prudent to at least try.  With his dilated LA, I will start him on amiodarone as an antiarrhythmic therapy with the greatest likelihood for pharmacologic success.  I will decrease Bystolic to 5 mg.  He will initiate amiodarone 200 mg for the first week, and as long as his heart rate is not too slow and is tolerating this and he will increase this to 20 mg twice a day.  I will see him back in the office in 4 weeks for reevaluation.  If he still in atrial fibrillation, we will plan an initial attempt at DC cardioversion on amiodarone therapy after approximately 6 weeks of treatment to allow for therapeutic steady-state.  He will continue being on low-dose simvastatin and if medication titration as necessary he will need to be changed to a different statin therapy  Time spent: 25 minutes Troy Sine, MD, [facility]  07/13/2016 1:17 PM

## 2016-07-20 DIAGNOSIS — I480 Paroxysmal atrial fibrillation: Secondary | ICD-10-CM | POA: Diagnosis not present

## 2016-07-20 DIAGNOSIS — G4730 Sleep apnea, unspecified: Secondary | ICD-10-CM | POA: Diagnosis not present

## 2016-07-20 DIAGNOSIS — E784 Other hyperlipidemia: Secondary | ICD-10-CM | POA: Diagnosis not present

## 2016-07-20 DIAGNOSIS — I10 Essential (primary) hypertension: Secondary | ICD-10-CM | POA: Diagnosis not present

## 2016-08-18 ENCOUNTER — Encounter: Payer: Self-pay | Admitting: Cardiovascular Disease

## 2016-08-18 ENCOUNTER — Ambulatory Visit (INDEPENDENT_AMBULATORY_CARE_PROVIDER_SITE_OTHER): Payer: Medicare Other | Admitting: Cardiovascular Disease

## 2016-08-18 ENCOUNTER — Ambulatory Visit
Admission: RE | Admit: 2016-08-18 | Discharge: 2016-08-18 | Disposition: A | Discharge status: Home / Self Care | Payer: Medicare Other | Source: Ambulatory Visit | Attending: Cardiovascular Disease | Admitting: Cardiovascular Disease

## 2016-08-18 VITALS — BP 158/84 | HR 64 | Ht 70.0 in | Wt 194.0 lb

## 2016-08-18 DIAGNOSIS — Z01811 Encounter for preprocedural respiratory examination: Secondary | ICD-10-CM | POA: Diagnosis not present

## 2016-08-18 DIAGNOSIS — Z5181 Encounter for therapeutic drug level monitoring: Secondary | ICD-10-CM | POA: Diagnosis not present

## 2016-08-18 DIAGNOSIS — I10 Essential (primary) hypertension: Secondary | ICD-10-CM | POA: Diagnosis not present

## 2016-08-18 DIAGNOSIS — E781 Pure hyperglyceridemia: Secondary | ICD-10-CM

## 2016-08-18 DIAGNOSIS — Z01818 Encounter for other preprocedural examination: Secondary | ICD-10-CM

## 2016-08-18 DIAGNOSIS — I481 Persistent atrial fibrillation: Secondary | ICD-10-CM

## 2016-08-18 DIAGNOSIS — I4819 Other persistent atrial fibrillation: Secondary | ICD-10-CM

## 2016-08-18 DIAGNOSIS — G4733 Obstructive sleep apnea (adult) (pediatric): Secondary | ICD-10-CM | POA: Diagnosis not present

## 2016-08-18 DIAGNOSIS — Z9989 Dependence on other enabling machines and devices: Secondary | ICD-10-CM | POA: Diagnosis not present

## 2016-08-18 DIAGNOSIS — Z7901 Long term (current) use of anticoagulants: Secondary | ICD-10-CM

## 2016-08-18 MED ORDER — DILTIAZEM HCL ER COATED BEADS 240 MG PO CP24: 240.0000 mg | ORAL_CAPSULE | Freq: Every day | ORAL | 6 refills | Status: DC

## 2016-08-18 NOTE — Progress Notes (Signed)
Patient ID: TIARA BARTOLI, male   DOB: 1940/06/06, 76 y.o.   MRN: [id number]    Primary M.D.: Dr. Bryon Lions  HPI: Kalyan Barabas Grippi is a 76 y.o. male who presents for a 5 week follow-up evaluation.  Mr. Stuhr has a history of hypertension, obstructive sleep apnea on CPAP therapy, GERD, and palpitations with atrial ectopy,. 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 He has history of hyperlipidemia and has been on simvastatin and had intermittently stopped this secondary to arthralgias.  An echo Doppler study in January 2015 revealed a normal systolic function with ejection fraction of 55-60%. There was evidence for mild aortic and mitral insufficiency. His left atrium was moderately dilated.   [name] was diagnosed with obstructive sleep apnea in 2010. He had mild sleep apnea with events worse in the supine position and during REM sleep. He has been on CPAP therapy since that time and initially he was started on an 8 cm water pressure but also this was later titrated to 10 cm.  He admits to 100% compliance.  His initial machine malfunctioned in 2017 and  He received a new AirSense 10 CPAP unit.  He has noticed significant improvement in this new machine.  Compared to his old one.  He admits to 100% compliance.  He goes to bed between 9:30 9:45 AM and wakes up once around 2:30 to the bathroom and typically sleeps 9-10 hours per night.  A download was obtained from his new machine from 05/07/2015 through 06/05/2015.  This reveals 100% compliance.  He is averaging 9 hours and 53 minutes per night.  He is set at a 10 cm water pressure.  His AHI was excellent at 1.1.  He uses a fullface mask.  There was no leak.    He admits to using CPAP 100% of the time and typically goes to bed between 9:30 and 10 AM and wakes up between 7:30 and 8:30 AM.  His DME company is [facility].  A rhythm disturbance.  He denies episodes of chest pressure.  Add blood work done in October 2017 by  Dr. Laurance Flatten in 2 days to be followed by Bryon Lions.  His LDL was slightly elevated at 102.  His PSA was slightly elevated but improved and he never did see a urologist.    When I saw him last month for one-year evaluation, his ECG demonstrated atrial fibrillation.  He was in this of questionable duration.  At that time, I initiated anticoagulation with eliquis  5 mg twice a day.  I discontinued amlodipine and started Cardizem CD 180 mg.  He underwent an echo Doppler study on 06/23/2016 which showed an EF of 55-60%.  He did not have wall motion abnormalities.  There was mild aortic sclerosis with mildAR and mild focal calcification of the anterior mitral valve leaflet with trivial MR.  His right ventricle is mildly dilated.  His left atrium was severely dilated and measured 64 mm parasternally.  When I last saw him, I initiated therapy with amiodarone as an antiarrhythmic agent, with the greatest likelihood for pharmacologic success.  I decrease Bystolic to 5 mg.  He started amiodarone at 200 mg first week and ultimately titrated this to 200 mg twice a day.  He has felt improved with control his ventricular rate.  He notices that his blood pressure is slightly increased on the reduced dose of Bystolic.  He denies any episodes of chest pressure.. [name] is unaware of  his irregular rhythm.    He continues to use CPAP with 100% compliance.  He presents for reevaluation.   Past Medical History:  Diagnosis Date  . GERD (gastroesophageal reflux disease)   . High cholesterol   . Hx of echocardiogram 12/2010   EF 55%, There is Stage 1 diastolic dyfunction. Normal LV filling pressure. Aortic sclerosis with mild AI, mild TR  . Hypertension   . Palpitation    with ecotopy  . Sleep apnea     No past surgical history on file.  No Known Allergies  Current Outpatient Prescriptions  Medication Sig Dispense Refill  . amiodarone (PACERONE) 200 MG tablet Take 1 tablet (200 mg total) by mouth 2 (two)  times daily. 60 tablet 6  . aspirin 81 MG chewable tablet Chew 81 mg by mouth daily.    . Cholecalciferol (VITAMIN D) 2000 UNITS tablet Take 2,000 Units by mouth daily.    Marland Kitchen ELIQUIS 5 MG TABS tablet TAKE 1 TABLET BY MOUTH TWICE A DAY  180 tablet 1  . hydrochlorothiazide (HYDRODIURIL) 25 MG tablet Take 25 mg by mouth daily.    Marland Kitchen lisinopril (PRINIVIL,ZESTRIL) 40 MG tablet Take 40 mg by mouth daily.    . nebivolol (BYSTOLIC) 10 MG tablet Take 5 mg by mouth daily.    . NON FORMULARY CPAP therapy    . Saccharomyces boulardii (PROBIOTIC) 250 MG CAPS Take 1 capsule by mouth daily.    . simvastatin (ZOCOR) 20 MG tablet Take 0.5 tablets by mouth daily.    . Vitamin E 400 units TABS Take 1 tablet by mouth daily.    Marland Kitchen diltiazem (CARDIZEM CD) 240 MG 24 hr capsule Take 1 capsule (240 mg total) by mouth daily. 30 capsule 6   No current facility-administered medications for this visit.     Social History   Social History  . Marital status: Married    Spouse name: N/A  . Number of children: N/A  . Years of education: N/A   Occupational History  . Not on file.   Social History Main Topics  . Smoking status: Former Smoker    Types: Cigarettes  . Smokeless tobacco: Never Used     Comment: quit smoking in 1982  . Alcohol use No  . Drug use: No  . Sexual activity: Not on file   Other Topics Concern  . Not on file   Social History Narrative  . No narrative on file    No family history on file.  Social history is notable in that he is married; 2 children and 2 grandchildren. 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 There is no tobacco or alcohol use.  ROS General: Negative; No fevers, chills, or night sweats;  HEENT: Negative; No changes in vision or hearing, sinus congestion, difficulty swallowing Pulmonary: Negative; No cough, wheezing, shortness of breath, hemoptysis Cardiovascular:  Negative; No chest pain, presyncope, syncope, GI: Negative; No nausea, vomiting, diarrhea,  or abdominal pain GU: Positive for mild PSA elevation for which he was treated with an antibiotic.  Repeat laboratory will be forthcoming by Dr. Bryon Lions.  No dysuria, hematuria, or difficulty voiding Musculoskeletal: Negative; no myalgias, joint pain, or weakness Hematologic/Oncology: Negative; no easy bruising, bleeding Endocrine: Negative; no heat/cold intolerance; no diabetes Neuro: Negative; no changes in balance, headaches Skin: Negative; No rashes or skin lesions Psychiatric: Negative; No behavioral problems, depression Sleep: Positive for obstructive sleep apnea on CPAP therapy.  No snoring, daytime sleepiness, hypersomnolence, bruxism, restless legs,  hypnogognic hallucinations, no cataplexy Other comprehensive 14 point system review is negative.  PE:  Wt Readings from Last 3 Encounters:  08/18/16 194 lb (88 kg)  07/13/16 201 lb (91.2 kg)  06/07/16 200 lb (90.7 kg)   Physical Exam BP (!) 158/84   Pulse 64   Ht _0  (1.778 m)   Wt 194 lb (88 kg)   BMI 27.84 kg/m    Repeat blood pressure by me was 160/84  General: Alert, oriented, no distress.  Skin: normal turgor, no rashes, warm and dry HEENT: Normocephalic, atraumatic. Pupils equal round and reactive to light; sclera anicteric; extraocular muscles intact; Nose without nasal septal hypertrophy Mouth/Parynx benign; Mallinpatti scale 3 Neck: No JVD, no carotid bruits; normal carotid upstroke Lungs: clear to ausculatation and percussion; no wheezing or rales Chest wall: without tenderness to palpitation Heart: irregularly irregular rhythm with a ventricular rate in the 60s., s1 s2 normal, .  No S3 gallop; 1/6 systolic murmur, no diastolic murmur, no rubs, gallops, thrills, or heaves Abdomen:umbilical hernia, small soft, nontender; no hepatosplenomehaly, BS+; abdominal aorta nontender and not dilated by palpation. Back: no CVA tenderness Pulses 2+ Musculoskeletal: full range of motion, normal strength, no joint  deformities Extremities: no clubbing cyanosis or edema, Homan's sign negative  Neurologic: grossly nonfocal; Cranial nerves grossly wnl Psychologic: Normal mood and affect   ECG (independently read by me): atrial flib  /flutter with coarse laboratory wave.  07/13/2016 ECG (independently read by me): Atrial fibrillation with a ventricular rate in the 50s.  Mild RV conduction delay.  March 2018 ECG (independently read by me): Atrial fibrillation at 63 bpm.  QTc interval 427 ms.  March 2016 ECG (independently read by me): Sinus bradycardia 59 bpm.  Mild RV conduction delay.  Normal intervals.  March 2015 ECG: Normal sinus rhythm at 60 beats per minute. No ectopy. Nonspecific T changes in lead 3.  Prior 02/04/2013 ECG: Sinus bradycardia with PACs.  LABS:  BMP Latest Ref Rng & Units 06/07/2016 06/03/2011 06/03/2011  Glucose 65 - 99 mg/dL 76 117(H) 117(H)  BUN 7 - 25 mg/dL _1 Creatinine 0.70 - 1.18 mg/dL 0.85 1.00 0.79  Sodium 135 - 146 mmol/L 143 143 139  Potassium 3.5 - 5.3 mmol/L 4.0 3.1(L) 3.5  Chloride 98 - 110 mmol/L 104 103 101  CO2 20 - 31 mmol/L 28 - 24  Calcium 8.6 - 10.3 mg/dL 9.8 - 9.4    Hepatic Function Latest Ref Rng & Units 06/07/2016 06/03/2011  Total Protein 6.1 - 8.1 g/dL 7.1 6.9  Albumin 3.6 - 5.1 g/dL 4.2 3.9  AST 10 - 35 U/L 18 30  ALT 9 - 46 U/L 18 23  Alk Phosphatase 40 - 115 U/L 54 59  Total Bilirubin 0.2 - 1.2 mg/dL 1.3(H) 0.8   CBC Latest Ref Rng & Units 06/07/2016 06/03/2011 06/03/2011  WBC 3.8 - 10.8 K/uL 6.7 - 16.7(H)  Hemoglobin 13.2 - 17.1 g/dL 15.5 14.3 14.4  Hematocrit 38.5 - 50.0 % 46.1 42.0 40.9  Platelets 140 - 400 K/uL 206 - 228   Lab Results  Component Value Date   MCV 91.8 06/07/2016   MCV 87.4 06/03/2011   Lab Results  Component Value Date   TSH 2.23 06/07/2016     BNP No results found for: PROBNP  Lipid Panel     Component Value Date/Time   CHOL 160 06/07/2016 1030   TRIG 138 06/07/2016 1030   HDL 53 06/07/2016 1030   CHOLHDL  3.0  06/07/2016 1030   VLDL 28 06/07/2016 1030   LDLCALC 79 06/07/2016 1030    RADIOLOGY: No results found.  IMPRESSION:   1. Essential hypertension   2. Persistent atrial fibrillation ([place])   3. Pre-op chest exam   4. Pre-op testing   5. Anticoagulation management encounter   6. Pure hyperglyceridemia   7. OSA on CPAP     ASSESSMENT AND PLAN: Mr. Blayn Whetsell is a 76 years old gentleman who has a history of hypertension, obstructive sleep apnea, palpitations with documented atrial ectopy, and hyperlipidemia.   He has been on CPAP therapy since 2010 and  received a new [employer] AirSense10 CPAP machine last year.  His compliance is outstanding.   He denies breakthrough snoring or hypersomnolence.  He is meeting Medicare compliance standards.  His DME his [facility].  When I saw him earlier this year last month, his ECG demonstrated atrial fibrillation.  The last ECG that I was able to see was from 2 years previously which showed sinus rhythm. I discontinued amlodipine and started Cardizem CD in light of his atrial fibrillation.  He is on anticoagulation and denies bleeding on eliquis I reviewed his echo Doppler data.  LV function is normal.  I discussed with him my concern  that his left atrium is severely dilated, which is an independent risk factor for recurrent AF following restoration of sinus rhythm.  However, since he has not had a previous attempt at restoration of sinus rhythm we discussed at least  having an attempt. Over the past month, he has felt improved and now has been on amiodarone at 200 mg twice a day.  He is been on a reduced dose of Bystolic.  His blood pressure today is elevated and I'll repeat by me was 160/84.  His ventricular rate typically is been running in the 60s.  I will try to slightly increase his diltiazem from 180 mg to 40 mg but I cautioned him if his heart rate gets too slow.  We may need to make adjustment.  I will also decrease simvastatin.  Presently  to 10 mg but in the future, we will most likely switch him to a different statin therapy with less potential drug to drug interaction.  His several weeks, I will schedule him for an attempt at DC cardioversion.  He continues to be on eloquence for anticoagulation.  I discussed the DC procedure with him in detail.  Laboratory will need to be obtained prior to that assessment and since it is been a long time since a chest x-ray, a chest x-ray will be obtained.   Time spent: 25 minutes Troy Sine, MD, [facility]  08/18/2016 10:46 AM

## 2016-08-18 NOTE — Patient Instructions (Signed)
Your physician has recommended that you have a Cardioversion (DCCV) [place] [facility]. Electrical Cardioversion uses a jolt of electricity to your heart either through paddles or wired patches attached to your chest. This is a controlled, usually prescheduled, procedure. Defibrillation is done under light anesthesia in the hospital, and you usually go home the day of the procedure. This is done to get your heart back into a normal rhythm. You are not awake for the procedure. Please see the instruction sheet given to you today.  Your physician recommends that you return for lab work and chest xray within 7 days of the procedure.  Your physician has recommended you make the following change in your medication:   1.) the diltiazem has been increased from 180 mg to 240 mg daily. A new prescription has been sent to your pharmacy to reflect this change.  2.) the simvastatin has been decreased from 20 mg to 10 mg daily ( 1/2 tablet)  Your physician recommends that you schedule a follow-up appointment in: 2 weeks post procedure.

## 2016-08-25 ENCOUNTER — Other Ambulatory Visit: Payer: Self-pay | Admitting: *Deleted

## 2016-08-25 DIAGNOSIS — Z01818 Encounter for other preprocedural examination: Secondary | ICD-10-CM

## 2016-09-04 DIAGNOSIS — D689 Coagulation defect, unspecified: Secondary | ICD-10-CM | POA: Diagnosis not present

## 2016-09-04 DIAGNOSIS — I10 Essential (primary) hypertension: Secondary | ICD-10-CM | POA: Diagnosis not present

## 2016-09-04 DIAGNOSIS — Z01818 Encounter for other preprocedural examination: Secondary | ICD-10-CM | POA: Diagnosis not present

## 2016-09-04 LAB — CBC
HCT: 45.6 % (ref 38.5–50.0)
HEMOGLOBIN: 15.2 g/dL (ref 13.2–17.1)
MCH: 31 pg (ref 27.0–33.0)
MCHC: 33.3 g/dL (ref 32.0–36.0)
MCV: 93.1 fL (ref 80.0–100.0)
MPV: 10.3 fL (ref 7.5–12.5)
PLATELETS: 241 10*3/uL (ref 140–400)
RBC: 4.9 MIL/uL (ref 4.20–5.80)
RDW: 14.3 % (ref 11.0–15.0)
WBC: 7.1 10*3/uL (ref 3.8–10.8)

## 2016-09-05 LAB — BASIC METABOLIC PANEL
BUN: 15 mg/dL (ref 7–25)
CALCIUM: 9.3 mg/dL (ref 8.6–10.3)
CO2: 27 mmol/L (ref 20–31)
Chloride: 101 mmol/L (ref 98–110)
Creat: 0.95 mg/dL (ref 0.70–1.18)
Glucose, Bld: 73 mg/dL (ref 65–99)
POTASSIUM: 3.9 mmol/L (ref 3.5–5.3)
Sodium: 140 mmol/L (ref 135–146)

## 2016-09-05 LAB — APTT: APTT: 27 s (ref 22–34)

## 2016-09-05 LAB — PROTIME-INR
INR: 1.1
Prothrombin Time: 11.3 s (ref 9.0–11.5)

## 2016-09-12 ENCOUNTER — Ambulatory Visit (HOSPITAL_COMMUNITY): Payer: Medicare Other | Admitting: Anesthesiology

## 2016-09-12 ENCOUNTER — Encounter (HOSPITAL_COMMUNITY)
Admission: RE | Disposition: A | Discharge status: Home / Self Care | Payer: Self-pay | Source: Ambulatory Visit | Attending: Cardiovascular Disease

## 2016-09-12 ENCOUNTER — Encounter (HOSPITAL_COMMUNITY): Payer: Self-pay | Admitting: Emergency Medicine

## 2016-09-12 ENCOUNTER — Ambulatory Visit (HOSPITAL_COMMUNITY)
Admission: RE | Admit: 2016-09-12 | Discharge: 2016-09-12 | Disposition: A | Discharge status: Home / Self Care | Payer: Medicare Other | Source: Ambulatory Visit | Attending: Cardiovascular Disease | Admitting: Cardiovascular Disease

## 2016-09-12 DIAGNOSIS — E7800 Pure hypercholesterolemia, unspecified: Secondary | ICD-10-CM | POA: Insufficient documentation

## 2016-09-12 DIAGNOSIS — Z7901 Long term (current) use of anticoagulants: Secondary | ICD-10-CM | POA: Insufficient documentation

## 2016-09-12 DIAGNOSIS — G4733 Obstructive sleep apnea (adult) (pediatric): Secondary | ICD-10-CM | POA: Diagnosis not present

## 2016-09-12 DIAGNOSIS — Z7982 Long term (current) use of aspirin: Secondary | ICD-10-CM | POA: Insufficient documentation

## 2016-09-12 DIAGNOSIS — I481 Persistent atrial fibrillation: Secondary | ICD-10-CM | POA: Insufficient documentation

## 2016-09-12 DIAGNOSIS — E785 Hyperlipidemia, unspecified: Secondary | ICD-10-CM | POA: Diagnosis not present

## 2016-09-12 DIAGNOSIS — I4892 Unspecified atrial flutter: Secondary | ICD-10-CM | POA: Insufficient documentation

## 2016-09-12 DIAGNOSIS — K219 Gastro-esophageal reflux disease without esophagitis: Secondary | ICD-10-CM | POA: Diagnosis not present

## 2016-09-12 DIAGNOSIS — E781 Pure hyperglyceridemia: Secondary | ICD-10-CM | POA: Insufficient documentation

## 2016-09-12 DIAGNOSIS — Z87891 Personal history of nicotine dependence: Secondary | ICD-10-CM | POA: Diagnosis not present

## 2016-09-12 DIAGNOSIS — Z5181 Encounter for therapeutic drug level monitoring: Secondary | ICD-10-CM | POA: Diagnosis not present

## 2016-09-12 DIAGNOSIS — I4891 Unspecified atrial fibrillation: Secondary | ICD-10-CM | POA: Diagnosis not present

## 2016-09-12 DIAGNOSIS — Z79899 Other long term (current) drug therapy: Secondary | ICD-10-CM | POA: Insufficient documentation

## 2016-09-12 DIAGNOSIS — I10 Essential (primary) hypertension: Secondary | ICD-10-CM | POA: Insufficient documentation

## 2016-09-12 DIAGNOSIS — Z01818 Encounter for other preprocedural examination: Secondary | ICD-10-CM | POA: Diagnosis not present

## 2016-09-12 HISTORY — PX: CARDIOVERSION: SHX1299

## 2016-09-12 SURGERY — CARDIOVERSION
Anesthesia: General

## 2016-09-12 MED ORDER — PROPOFOL 10 MG/ML IV BOLUS
INTRAVENOUS | Status: DC | PRN
  Administered 2016-09-12: 50 mg via INTRAVENOUS

## 2016-09-12 MED ORDER — LIDOCAINE HCL (CARDIAC) 20 MG/ML IV SOLN
INTRAVENOUS | Status: DC | PRN
  Administered 2016-09-12: 60 mg via INTRAVENOUS

## 2016-09-12 MED ORDER — LIDOCAINE HCL (CARDIAC) 20 MG/ML IV SOLN: INTRAVENOUS | Status: DC | PRN

## 2016-09-12 MED ORDER — SODIUM CHLORIDE 0.9 % IV SOLN
INTRAVENOUS | Status: DC
  Administered 2016-09-12: 12:00:00 via INTRAVENOUS

## 2016-09-12 NOTE — Transfer of Care (Signed)
Immediate Anesthesia Transfer of Care Note  Patient: Mario Palmer  Procedure(s) Performed: Procedure(s): CARDIOVERSION (N/A)  Patient Location: Endoscopy Unit  Anesthesia Type:MAC  Level of Consciousness: awake, alert  and oriented  Airway & Oxygen Therapy: Patient Spontanous Breathing  Post-op Assessment: Report given to RN and Post -op Vital signs reviewed and stable  Post vital signs: Reviewed and stable  Last Vitals:  Vitals:   09/12/16 1135  BP: (!) 193/94  Pulse: (!) 56  Resp: 18  Temp: 36.9 C    Last Pain:  Vitals:   09/12/16 1135  TempSrc: Oral         Complications: No apparent anesthesia complications

## 2016-09-12 NOTE — H&P (View-Only) (Signed)
Patient ID: Mario Palmer, male   DOB: Jul 22, 1940, 76 y.o.   MRN: [id number]    Primary M.D.: Dr. Bryon Lions  HPI: Mario Palmer is a 76 y.o. male who presents for a 5 week follow-up evaluation.  Mario Palmer has a history of hypertension, obstructive sleep apnea on CPAP therapy, GERD, and palpitations with atrial ectopy,. 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 He has history of hyperlipidemia and has been on simvastatin and had intermittently stopped this secondary to arthralgias.  An echo Doppler study in January 2015 revealed a normal systolic function with ejection fraction of 55-60%. There was evidence for mild aortic and mitral insufficiency. His left atrium was moderately dilated.   Mario Palmer was diagnosed with obstructive sleep apnea in 2010. He had mild sleep apnea with events worse in the supine position and during REM sleep. He has been on CPAP therapy since that time and initially he was started on an 8 cm water pressure but also this was later titrated to 10 cm.  He admits to 100% compliance.  His initial machine malfunctioned in 2017 and  He received a new AirSense 10 CPAP unit.  He has noticed significant improvement in this new machine.  Compared to his old one.  He admits to 100% compliance.  He goes to bed between 9:30 9:45 AM and wakes up once around 2:30 to the bathroom and typically sleeps 9-10 hours per night.  A download was obtained from his new machine from 05/07/2015 through 06/05/2015.  This reveals 100% compliance.  He is averaging 9 hours and 53 minutes per night.  He is set at a 10 cm water pressure.  His AHI was excellent at 1.1.  He uses a fullface mask.  There was no leak.    He admits to using CPAP 100% of the time and typically goes to bed between 9:30 and 10 AM and wakes up between 7:30 and 8:30 AM.  His DME company is [facility].  A rhythm disturbance.  He denies episodes of chest pressure.  Add blood work done in October 2017 by  Dr. Laurance Flatten in 2 days to be followed by Bryon Lions.  His LDL was slightly elevated at 102.  His PSA was slightly elevated but improved and he never did see a urologist.    When I saw him last month for one-year evaluation, his ECG demonstrated atrial fibrillation.  He was in this of questionable duration.  At that time, I initiated anticoagulation with eliquis  5 mg twice a day.  I discontinued amlodipine and started Cardizem CD 180 mg.  He underwent an echo Doppler study on 06/23/2016 which showed an EF of 55-60%.  He did not have wall motion abnormalities.  There was mild aortic sclerosis with mildAR and mild focal calcification of the anterior mitral valve leaflet with trivial MR.  His right ventricle is mildly dilated.  His left atrium was severely dilated and measured 64 mm parasternally.  When I last saw him, I initiated therapy with amiodarone as an antiarrhythmic agent, with the greatest likelihood for pharmacologic success.  I decrease Bystolic to 5 mg.  He started amiodarone at 200 mg first week and ultimately titrated this to 200 mg twice a day.  He has felt improved with control his ventricular rate.  He notices that his blood pressure is slightly increased on the reduced dose of Bystolic.  He denies any episodes of chest pressure.. Mario Palmer is unaware of  his irregular rhythm.    He continues to use CPAP with 100% compliance.  He presents for reevaluation.   Past Medical History:  Diagnosis Date  . GERD (gastroesophageal reflux disease)   . High cholesterol   . Hx of echocardiogram 12/2010   EF 55%, There is Stage 1 diastolic dyfunction. Normal LV filling pressure. Aortic sclerosis with mild AI, mild TR  . Hypertension   . Palpitation    with ecotopy  . Sleep apnea     No past surgical history on file.  No Known Allergies  Current Outpatient Prescriptions  Medication Sig Dispense Refill  . amiodarone (PACERONE) 200 MG tablet Take 1 tablet (200 mg total) by mouth 2 (two)  times daily. 60 tablet 6  . aspirin 81 MG chewable tablet Chew 81 mg by mouth daily.    . Cholecalciferol (VITAMIN D) 2000 UNITS tablet Take 2,000 Units by mouth daily.    Marland Kitchen ELIQUIS 5 MG TABS tablet TAKE 1 TABLET BY MOUTH TWICE A DAY  180 tablet 1  . hydrochlorothiazide (HYDRODIURIL) 25 MG tablet Take 25 mg by mouth daily.    Marland Kitchen lisinopril (PRINIVIL,ZESTRIL) 40 MG tablet Take 40 mg by mouth daily.    . nebivolol (BYSTOLIC) 10 MG tablet Take 5 mg by mouth daily.    . NON FORMULARY CPAP therapy    . Saccharomyces boulardii (PROBIOTIC) 250 MG CAPS Take 1 capsule by mouth daily.    . simvastatin (ZOCOR) 20 MG tablet Take 0.5 tablets by mouth daily.    . Vitamin E 400 units TABS Take 1 tablet by mouth daily.    Marland Kitchen diltiazem (CARDIZEM CD) 240 MG 24 hr capsule Take 1 capsule (240 mg total) by mouth daily. 30 capsule 6   No current facility-administered medications for this visit.     Social History   Social History  . Marital status: Married    Spouse name: N/A  . Number of children: N/A  . Years of education: N/A   Occupational History  . Not on file.   Social History Main Topics  . Smoking status: Former Smoker    Types: Cigarettes  . Smokeless tobacco: Never Used     Comment: quit smoking in 1982  . Alcohol use No  . Drug use: No  . Sexual activity: Not on file   Other Topics Concern  . Not on file   Social History Narrative  . No narrative on file    No family history on file.  Social history is notable in that he is married; 2 children and 2 grandchildren. 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 There is no tobacco or alcohol use.  ROS General: Negative; No fevers, chills, or night sweats;  HEENT: Negative; No changes in vision or hearing, sinus congestion, difficulty swallowing Pulmonary: Negative; No cough, wheezing, shortness of breath, hemoptysis Cardiovascular:  Negative; No chest pain, presyncope, syncope, GI: Negative; No nausea, vomiting, diarrhea,  or abdominal pain GU: Positive for mild PSA elevation for which he was treated with an antibiotic.  Repeat laboratory will be forthcoming by Dr. Velna Hatchet.  No dysuria, hematuria, or difficulty voiding Musculoskeletal: Negative; no myalgias, joint pain, or weakness Hematologic/Oncology: Negative; no easy bruising, bleeding Endocrine: Negative; no heat/cold intolerance; no diabetes Neuro: Negative; no changes in balance, headaches Skin: Negative; No rashes or skin lesions Psychiatric: Negative; No behavioral problems, depression Sleep: Positive for obstructive sleep apnea on CPAP therapy.  No snoring, daytime sleepiness, hypersomnolence, bruxism, restless legs,  hypnogognic hallucinations, no cataplexy Other comprehensive 14 point system review is negative.  PE:  Wt Readings from Last 3 Encounters:  08/18/16 194 lb (88 kg)  07/13/16 201 lb (91.2 kg)  06/07/16 200 lb (90.7 kg)   Physical Exam BP (!) 158/84   Pulse 64   Ht _0  (1.778 m)   Wt 194 lb (88 kg)   BMI 27.84 kg/m    Repeat blood pressure by me was 160/84  General: Alert, oriented, no distress.  Skin: normal turgor, no rashes, warm and dry HEENT: Normocephalic, atraumatic. Pupils equal round and reactive to light; sclera anicteric; extraocular muscles intact; Nose without nasal septal hypertrophy Mouth/Parynx benign; Mallinpatti scale 3 Neck: No JVD, no carotid bruits; normal carotid upstroke Lungs: clear to ausculatation and percussion; no wheezing or rales Chest wall: without tenderness to palpitation Heart: irregularly irregular rhythm with a ventricular rate in the 60s., s1 s2 normal, .  No S3 gallop; 1/6 systolic murmur, no diastolic murmur, no rubs, gallops, thrills, or heaves Abdomen:umbilical hernia, small soft, nontender; no hepatosplenomehaly, BS+; abdominal aorta nontender and not dilated by palpation. Back: no CVA tenderness Pulses 2+ Musculoskeletal: full range of motion, normal strength, no joint  deformities Extremities: no clubbing cyanosis or edema, Homan's sign negative  Neurologic: grossly nonfocal; Cranial nerves grossly wnl Psychologic: Normal mood and affect   ECG (independently read by me): atrial flib  /flutter with coarse laboratory wave.  07/13/2016 ECG (independently read by me): Atrial fibrillation with a ventricular rate in the 50s.  Mild RV conduction delay.  March 2018 ECG (independently read by me): Atrial fibrillation at 63 bpm.  QTc interval 427 ms.  March 2016 ECG (independently read by me): Sinus bradycardia 59 bpm.  Mild RV conduction delay.  Normal intervals.  March 2015 ECG: Normal sinus rhythm at 60 beats per minute. No ectopy. Nonspecific T changes in lead 3.  Prior 02/04/2013 ECG: Sinus bradycardia with PACs.  LABS:  BMP Latest Ref Rng & Units 06/07/2016 06/03/2011 06/03/2011  Glucose 65 - 99 mg/dL 76 117(H) 117(H)  BUN 7 - 25 mg/dL _1 Creatinine 0.70 - 1.18 mg/dL 0.85 1.00 0.79  Sodium 135 - 146 mmol/L 143 143 139  Potassium 3.5 - 5.3 mmol/L 4.0 3.1(L) 3.5  Chloride 98 - 110 mmol/L 104 103 101  CO2 20 - 31 mmol/L 28 - 24  Calcium 8.6 - 10.3 mg/dL 9.8 - 9.4    Hepatic Function Latest Ref Rng & Units 06/07/2016 06/03/2011  Total Protein 6.1 - 8.1 g/dL 7.1 6.9  Albumin 3.6 - 5.1 g/dL 4.2 3.9  AST 10 - 35 U/L 18 30  ALT 9 - 46 U/L 18 23  Alk Phosphatase 40 - 115 U/L 54 59  Total Bilirubin 0.2 - 1.2 mg/dL 1.3(H) 0.8   CBC Latest Ref Rng & Units 06/07/2016 06/03/2011 06/03/2011  WBC 3.8 - 10.8 K/uL 6.7 - 16.7(H)  Hemoglobin 13.2 - 17.1 g/dL 15.5 14.3 14.4  Hematocrit 38.5 - 50.0 % 46.1 42.0 40.9  Platelets 140 - 400 K/uL 206 - 228   Lab Results  Component Value Date   MCV 91.8 06/07/2016   MCV 87.4 06/03/2011   Lab Results  Component Value Date   TSH 2.23 06/07/2016     BNP No results found for: PROBNP  Lipid Panel     Component Value Date/Time   CHOL 160 06/07/2016 1030   TRIG 138 06/07/2016 1030   HDL 53 06/07/2016 1030   CHOLHDL  3.0  06/07/2016 1030   VLDL 28 06/07/2016 1030   LDLCALC 79 06/07/2016 1030    RADIOLOGY: No results found.  IMPRESSION:   1. Essential hypertension   2. Persistent atrial fibrillation ([place])   3. Pre-op chest exam   4. Pre-op testing   5. Anticoagulation management encounter   6. Pure hyperglyceridemia   7. OSA on CPAP     ASSESSMENT AND PLAN: Mario Palmer is a 76 years old gentleman who has a history of hypertension, obstructive sleep apnea, palpitations with documented atrial ectopy, and hyperlipidemia.   He has been on CPAP therapy since 2010 and  received a new [employer] AirSense10 CPAP machine last year.  His compliance is outstanding.   He denies breakthrough snoring or hypersomnolence.  He is meeting Medicare compliance standards.  His DME his [place].  When I saw him earlier this year last month, his ECG demonstrated atrial fibrillation.  The last ECG that I was able to see was from 2 years previously which showed sinus rhythm. I discontinued amlodipine and started Cardizem CD in light of his atrial fibrillation.  He is on anticoagulation and denies bleeding on eliquis I reviewed his echo Doppler data.  LV function is normal.  I discussed with him my concern  that his left atrium is severely dilated, which is an independent risk factor for recurrent AF following restoration of sinus rhythm.  However, since he has not had a previous attempt at restoration of sinus rhythm we discussed at least  having an attempt. Over the past month, he has felt improved and now has been on amiodarone at 200 mg twice a day.  He is been on a reduced dose of Bystolic.  His blood pressure today is elevated and I'll repeat by me was 160/84.  His ventricular rate typically is been running in the 60s.  I will try to slightly increase his diltiazem from 180 mg to 40 mg but I cautioned him if his heart rate gets too slow.  We may need to make adjustment.  I will also decrease simvastatin.  Presently  to 10 mg but in the future, we will most likely switch him to a different statin therapy with less potential drug to drug interaction.  His several weeks, I will schedule him for an attempt at DC cardioversion.  He continues to be on eloquence for anticoagulation.  I discussed the DC procedure with him in detail.  Laboratory will need to be obtained prior to that assessment and since it is been a long time since a chest x-ray, a chest x-ray will be obtained.   Time spent: 25 minutes Troy Sine, MD, [facility]  08/18/2016 10:46 AM

## 2016-09-12 NOTE — Anesthesia Postprocedure Evaluation (Signed)
Anesthesia Post Note  Patient: Mario Palmer  Procedure(s) Performed: Procedure(s) (LRB): CARDIOVERSION (N/A)     Patient location during evaluation: PACU Anesthesia Type: General Level of consciousness: awake and alert Pain management: pain level controlled Vital Signs Assessment: post-procedure vital signs reviewed and stable Respiratory status: spontaneous breathing, nonlabored ventilation and respiratory function stable Cardiovascular status: blood pressure returned to baseline and stable Postop Assessment: no signs of nausea or vomiting Anesthetic complications: no    Last Vitals:  Vitals:   09/12/16 1240 09/12/16 1250  BP: (!) 167/94 133/71  Pulse: (!) 55 (!) 59  Resp: 20 17  Temp:      Last Pain:  Vitals:   09/12/16 1230  TempSrc: Oral                 Seraphim Trow,W. EDMOND

## 2016-09-12 NOTE — Interval H&P Note (Signed)
History and Physical Interval Note:  09/12/2016 12:15 PM  Mario Palmer  has presented today for surgery, with the diagnosis of AFIB  The various methods of treatment have been discussed with the patient and family. After consideration of risks, benefits and other options for treatment, the patient has consented to  Procedure(s): CARDIOVERSION (N/A) as a surgical intervention .  The patient's history has been reviewed, patient examined, no change in status, stable for surgery.  I have reviewed the patient's chart and labs.  Questions were answered to the patient's satisfaction.     [name]

## 2016-09-12 NOTE — CV Procedure (Signed)
  CARDIOVERSION NOTE   Procedure: Electrical Cardioversion Indications:  Atrial Flutter  Procedure Details:  Consent: Risks of procedure as well as the alternatives and risks of each were explained to the (patient/caregiver).  Consent for procedure obtained.  Time Out: Verified patient identification, verified procedure, site/side was marked, verified correct patient position, special equipment/implants available, medications/allergies/relevent history reviewed, required imaging and test results available. Time out performed.   Patient placed on cardiac monitor, pulse oximetry, supplemental oxygen as necessary.  Sedation given: proppofol 50 mg; lidocaine 60 mg Pacer pads placed anterior and posterior chest.  Cardioverted 1 time(s).  Cardioverted at 120J.  Evaluation: Findings: Post procedure EKG shows: NSR Complications: None Patient did tolerate procedure well.     Troy Sine, MD, [facility] 09/12/2016 12:24 PM

## 2016-09-12 NOTE — Anesthesia Preprocedure Evaluation (Addendum)
Anesthesia Evaluation  Patient identified by MRN, date of birth, ID band Patient awake    Reviewed: Allergy & Precautions, H&P , NPO status , Patient's Chart, lab work & pertinent test results, reviewed documented beta blocker date and time   Airway Mallampati: II  TM Distance: >3 FB Neck ROM: Full    Dental no notable dental hx. (+) Teeth Intact, Dental Advisory Given   Pulmonary sleep apnea and Continuous Positive Airway Pressure Ventilation , former smoker,    Pulmonary exam normal breath sounds clear to auscultation       Cardiovascular hypertension, Pt. on medications and Pt. on home beta blockers  Rhythm:Regular Rate:Normal     Neuro/Psych negative neurological ROS  negative psych ROS   GI/Hepatic negative GI ROS, Neg liver ROS,   Endo/Other  negative endocrine ROS  Renal/GU negative Renal ROS  negative genitourinary   Musculoskeletal   Abdominal   Peds  Hematology negative hematology ROS (+)   Anesthesia Other Findings   Reproductive/Obstetrics negative OB ROS                            Anesthesia Physical Anesthesia Plan  ASA: III  Anesthesia Plan: General   Post-op Pain Management:    Induction: Intravenous  PONV Risk Score and Plan: 2 and Propofol and Treatment may vary due to age or medical condition  Airway Management Planned: Mask  Additional Equipment:   Intra-op Plan:   Post-operative Plan:   Informed Consent: I have reviewed the patients History and Physical, chart, labs and discussed the procedure including the risks, benefits and alternatives for the proposed anesthesia with the patient or authorized representative who has indicated his/her understanding and acceptance.   Dental advisory given  Plan Discussed with: CRNA  Anesthesia Plan Comments:         Anesthesia Quick Evaluation

## 2016-09-12 NOTE — Anesthesia Procedure Notes (Signed)
Procedure Name: MAC Date/Time: 09/12/2016 12:20 PM Performed by: Teressa Lower Pre-anesthesia Checklist: Patient identified, Emergency Drugs available, Suction available, Patient being monitored and Timeout performed Patient Re-evaluated:Patient Re-evaluated prior to inductionOxygen Delivery Method: Nasal cannula

## 2016-09-13 ENCOUNTER — Encounter (HOSPITAL_COMMUNITY): Payer: Self-pay | Admitting: Cardiovascular Disease

## 2016-09-22 ENCOUNTER — Ambulatory Visit (INDEPENDENT_AMBULATORY_CARE_PROVIDER_SITE_OTHER): Payer: Medicare Other | Admitting: Cardiovascular Disease

## 2016-09-22 ENCOUNTER — Encounter: Payer: Self-pay | Admitting: Cardiovascular Disease

## 2016-09-22 VITALS — BP 161/80 | HR 62 | Ht 70.0 in | Wt 192.0 lb

## 2016-09-22 DIAGNOSIS — I10 Essential (primary) hypertension: Secondary | ICD-10-CM | POA: Diagnosis not present

## 2016-09-22 DIAGNOSIS — Z9989 Dependence on other enabling machines and devices: Secondary | ICD-10-CM | POA: Diagnosis not present

## 2016-09-22 DIAGNOSIS — Z7901 Long term (current) use of anticoagulants: Secondary | ICD-10-CM | POA: Diagnosis not present

## 2016-09-22 DIAGNOSIS — E781 Pure hyperglyceridemia: Secondary | ICD-10-CM | POA: Diagnosis not present

## 2016-09-22 DIAGNOSIS — I480 Paroxysmal atrial fibrillation: Secondary | ICD-10-CM

## 2016-09-22 DIAGNOSIS — G4733 Obstructive sleep apnea (adult) (pediatric): Secondary | ICD-10-CM | POA: Diagnosis not present

## 2016-09-22 MED ORDER — NEBIVOLOL HCL 5 MG PO TABS
7.5000 mg | ORAL_TABLET | Freq: Every day | ORAL | 3 refills | Status: DC
Start: 2016-09-22 — End: 2016-12-22

## 2016-09-22 MED ORDER — AMIODARONE HCL 200 MG PO TABS: ORAL_TABLET | ORAL | 6 refills | Status: DC

## 2016-09-22 NOTE — Progress Notes (Signed)
Patient ID: Mario Palmer, male   DOB: 12/05/1940, 76 y.o.   MRN: [id number]    Primary M.D.: Dr. Bryon Lions  HPI: Mario Palmer is a 75 y.o. male who presents for a one month follow-up evaluation and follow-up of his DC cardioversion  Mario Palmer has a history of hypertension, obstructive sleep apnea on CPAP therapy, GERD, and palpitations with atrial ectopy,. 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 He has history of hyperlipidemia and has been on simvastatin and had intermittently stopped this secondary to arthralgias.  An echo Doppler study in January 2015 revealed a normal systolic function with ejection fraction of 55-60%. There was evidence for mild aortic and mitral insufficiency. His left atrium was moderately dilated.   Mario Palmer was diagnosed with obstructive sleep apnea in 2010. He had mild sleep apnea with events worse in the supine position and during REM sleep. He has been on CPAP therapy since that time and initially he was started on an 8 cm water pressure but also this was later titrated to 10 cm.  He admits to 100% compliance.  His initial machine malfunctioned in 2017 and  He received a new AirSense 10 CPAP unit.  He has noticed significant improvement in this new machine.  Compared to his old one.  He admits to 100% compliance.  He goes to bed between 9:30 9:45 AM and wakes up once around 2:30 to the bathroom and typically sleeps 9-10 hours per night.  A download was obtained from his new machine from 05/07/2015 through 06/05/2015.  This reveals 100% compliance.  He is averaging 9 hours and 53 minutes per night.  He is set at a 10 cm water pressure.  His AHI was excellent at 1.1.  He uses a fullface mask.  There was no leak.    He admits to using CPAP 100% of the time and typically goes to bed between 9:30 and 10 AM and wakes up between 7:30 and 8:30 AM.  His DME company is [employer].  A rhythm disturbance.  He denies episodes of chest pressure.   Add blood work done in October 2017 by Dr. Laurance Flatten in 2 days to be followed by Bryon Lions.  His LDL was slightly elevated at 102.  His PSA was slightly elevated but improved and he never did see a urologist.    When I saw him last month for one-year evaluation, his ECG demonstrated atrial fibrillation.  He was in this of questionable duration.  At that time, I initiated anticoagulation with eliquis  5 mg twice a day.  I discontinued amlodipine and started Cardizem CD 180 mg.  He underwent an echo Doppler study on 06/23/2016 which showed an EF of 55-60%.  He did not have wall motion abnormalities.  There was mild aortic sclerosis with mildAR and mild focal calcification of the anterior mitral valve leaflet with trivial MR.  His right ventricle is mildly dilated.  His left atrium was severely dilated and measured 64 mm parasternally.   I initiated therapy with amiodarone as an antiarrhythmic agent, with the greatest likelihood for pharmacologic success.  I decrease Bystolic to 5 mg.  He started amiodarone at 200 mg first week and ultimately titrated this to 200 mg twice a day.  He has felt improved with control his ventricular rate.  He notices that his blood pressure is slightly increased on the reduced dose of Bystolic.  He admitted to 100% compliance with CPAP therapy.  Since  I last saw him, he underwent successful DC cardioversion by me on 08/18/2016 with recurrence of sinus rhythm.  He has noticed improved energy since restoration of sinus rhythm.  He is unaware of any significant ectopy but does note a rare palpitation.  He has been exercising and pedals on a stationary bike for at least 40 minutes without chest pain or shortness of breath.  He presents for follow-up evaluation.  Past Medical History:  Diagnosis Date  . GERD (gastroesophageal reflux disease)   . High cholesterol   . Hx of echocardiogram 12/2010   EF 55%, There is Stage 1 diastolic dyfunction. Normal LV filling pressure. Aortic  sclerosis with mild AI, mild TR  . Hypertension   . Palpitation    with ecotopy  . Sleep apnea     Past Surgical History:  Procedure Laterality Date  . CARDIOVERSION N/A 09/12/2016   Procedure: CARDIOVERSION;  Surgeon: Troy Sine, MD;  Location: [facility] ENDOSCOPY;  Service: Cardiovascular;  Laterality: N/A;    No Known Allergies  Current Outpatient Prescriptions  Medication Sig Dispense Refill  . amiodarone (PACERONE) 200 MG tablet Take 1 and 1/2 tablet once daily x 1 month then decrease to 200 mg once daily 60 tablet 6  . aspirin 81 MG chewable tablet Chew 81 mg by mouth daily.    . Cholecalciferol (VITAMIN D) 2000 UNITS tablet Take 2,000 Units by mouth daily.    Marland Kitchen diltiazem (CARDIZEM CD) 240 MG 24 hr capsule Take 1 capsule (240 mg total) by mouth daily. 30 capsule 6  . ELIQUIS 5 MG TABS tablet TAKE 1 TABLET BY MOUTH TWICE A DAY  180 tablet 1  . hydrochlorothiazide (HYDRODIURIL) 25 MG tablet Take 25 mg by mouth daily.    Marland Kitchen lisinopril (PRINIVIL,ZESTRIL) 40 MG tablet Take 40 mg by mouth daily.    . nebivolol (BYSTOLIC) 5 MG tablet Take 1.5 tablets (7.5 mg total) by mouth daily. 135 tablet 3  . NON FORMULARY CPAP therapy    . Saccharomyces boulardii (PROBIOTIC) 250 MG CAPS Take 1 capsule by mouth daily.    . simvastatin (ZOCOR) 20 MG tablet Take 0.5 tablets by mouth daily.    . Vitamin E 400 units TABS Take 1 tablet by mouth daily.     No current facility-administered medications for this visit.     Social History   Social History  . Marital status: Married    Spouse name: N/A  . Number of children: N/A  . Years of education: N/A   Occupational History  . Not on file.   Social History Main Topics  . Smoking status: Former Smoker    Types: Cigarettes  . Smokeless tobacco: Never Used     Comment: quit smoking in 1982  . Alcohol use No  . Drug use: No  . Sexual activity: Not on file   Other Topics Concern  . Not on file   Social History Narrative  . No narrative on  file    No family history on file.  Social history is notable in that he is married; 2 children and 2 grandchildren. 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 There is no tobacco or alcohol use.  ROS General: Negative; No fevers, chills, or night sweats;  HEENT: Negative; No changes in vision or hearing, sinus congestion, difficulty swallowing Pulmonary: Negative; No cough, wheezing, shortness of breath, hemoptysis Cardiovascular:  Negative; No chest pain, presyncope, syncope, GI: Negative; No nausea, vomiting, diarrhea, or abdominal pain GU:  Positive for mild PSA elevation for which he was treated with an antibiotic.  Repeat laboratory will be forthcoming by Dr. Bryon Lions.  No dysuria, hematuria, or difficulty voiding Musculoskeletal: Negative; no myalgias, joint pain, or weakness Hematologic/Oncology: Negative; no easy bruising, bleeding Endocrine: Negative; no heat/cold intolerance; no diabetes Neuro: Negative; no changes in balance, headaches Skin: Negative; No rashes or skin lesions Psychiatric: Negative; No behavioral problems, depression Sleep: Positive for obstructive sleep apnea on CPAP therapy.  No snoring, daytime sleepiness, hypersomnolence, bruxism, restless legs, hypnogognic hallucinations, no cataplexy Other comprehensive 14 point system review is negative.  PE: BP (!) 161/80 (BP Location: Left Arm)   Pulse 62   Ht '5\' 10"'  (1.778 m)   Wt 192 lb (87.1 kg)   BMI 27.55 kg/m    Repeat blood pressure was 134/80  Wt Readings from Last 3 Encounters:  09/22/16 192 lb (87.1 kg)  09/12/16 198 lb (89.8 kg)  08/18/16 194 lb (88 kg)    General: Alert, oriented, no distress.  Skin: normal turgor, no rashes, warm and dry HEENT: Normocephalic, atraumatic. Pupils equal round and reactive to light; sclera anicteric; extraocular muscles intact;  Nose without nasal septal hypertrophy Mouth/Parynx benign; Mallinpatti scale 3 Neck: No JVD, no carotid bruits; normal  carotid upstroke Lungs: clear to ausculatation and percussion; no wheezing or rales Chest wall: without tenderness to palpitation Heart: PMI not displaced, RRR, s1 s2 normal, 1/6 systolic murmur, no diastolic murmur, no rubs, gallops, thrills, or heaves Abdomen: soft, nontender; no hepatosplenomehaly, BS+; abdominal aorta nontender and not dilated by palpation. Back: no CVA tenderness Pulses 2+ Musculoskeletal: full range of motion, normal strength, no joint deformities Extremities: no clubbing cyanosis or edema, Homan's sign negative  Neurologic: grossly nonfocal; Cranial nerves grossly wnl Psychologic: Normal mood and affect   ECG (independently read by me): Normal sinus rhythm at 62 bpm.  Left axis deviation.  QTc interval 479 ms.  No significant ST-T changes  May 2018 ECG (independently read by me): atrial flib  /flutter with coarse laboratory wave.  07/13/2016 ECG (independently read by me): Atrial fibrillation with a ventricular rate in the 50s.  Mild RV conduction delay.  March 2018 ECG (independently read by me): Atrial fibrillation at 63 bpm.  QTc interval 427 ms.  March 2016 ECG (independently read by me): Sinus bradycardia 59 bpm.  Mild RV conduction delay.  Normal intervals.  March 2015 ECG: Normal sinus rhythm at 60 beats per minute. No ectopy. Nonspecific T changes in lead 3.  Prior 02/04/2013 ECG: Sinus bradycardia with PACs.  LABS:  BMP Latest Ref Rng & Units 09/04/2016 06/07/2016 06/03/2011  Glucose 65 - 99 mg/dL 73 76 117(H)  BUN 7 - 25 mg/dL '15 14 20  ' Creatinine 0.70 - 1.18 mg/dL 0.95 0.85 1.00  Sodium 135 - 146 mmol/L 140 143 143  Potassium 3.5 - 5.3 mmol/L 3.9 4.0 3.1(L)  Chloride 98 - 110 mmol/L 101 104 103  CO2 20 - 31 mmol/L 27 28 -  Calcium 8.6 - 10.3 mg/dL 9.3 9.8 -    Hepatic Function Latest Ref Rng & Units 06/07/2016 06/03/2011  Total Protein 6.1 - 8.1 g/dL 7.1 6.9  Albumin 3.6 - 5.1 g/dL 4.2 3.9  AST 10 - 35 U/L 18 30  ALT 9 - 46 U/L 18 23  Alk  Phosphatase 40 - 115 U/L 54 59  Total Bilirubin 0.2 - 1.2 mg/dL 1.3(H) 0.8   CBC Latest Ref Rng & Units 09/04/2016 06/07/2016 06/03/2011  WBC 3.8 -  10.8 K/uL 7.1 6.7 -  Hemoglobin 13.2 - 17.1 g/dL 15.2 15.5 14.3  Hematocrit 38.5 - 50.0 % 45.6 46.1 42.0  Platelets 140 - 400 K/uL 241 206 -   Lab Results  Component Value Date   MCV 93.1 09/04/2016   MCV 91.8 06/07/2016   MCV 87.4 06/03/2011   Lab Results  Component Value Date   TSH 2.23 06/07/2016     BNP No results found for: PROBNP  Lipid Panel     Component Value Date/Time   CHOL 160 06/07/2016 1030   TRIG 138 06/07/2016 1030   HDL 53 06/07/2016 1030   CHOLHDL 3.0 06/07/2016 1030   VLDL 28 06/07/2016 1030   LDLCALC 79 06/07/2016 1030    RADIOLOGY: No results found.  IMPRESSION:   1. Essential hypertension   2. Paroxysmal atrial fibrillation (HCC)   3. OSA on CPAP   4. Anticoagulated   5. Pure hyperglyceridemia     ASSESSMENT AND PLAN: Mario Palmer is a 76 years old gentleman who has a history of hypertension, obstructive sleep apnea, palpitations with documented atrial ectopy, and hyperlipidemia.  He had developed atrial fibrillation/flutter, was on eliquis anticoagulation, and after titration of amiodarone, ultimately underwent successful DC cardioversion on 08/18/2016 with resolution of atrial flutter and restoration of sinus rhythm.  He feels improved and has more energy.  He is riding a stationary bike for at least 40 minutes at a time without chest pain or shortness of breath.  I have now recommended he reduce amiodarone to 300 mg for the next month and after that month.  He will reduce his dose down to 2 mg.  When he was started on amiodarone.  I reduced his Bystolic from 10 mg to 5 mg.  He will continue on the current dose of 5 mg for the next month, but when he reduces his amiodarone down to 200 mg daily.  He will increase Bystolic to 7.5 mg.  He has been on CPAP therapy since 2010 and  received a new [employer]  AirSense10 CPAP machine last year.  His compliance is outstanding.   He denies breakthrough snoring or hypersomnolence.  He is meeting Medicare compliance standards.  His DME his [facility].  His blood pressure today is controlled on repeat by me.  He continues to be on lisinopril 40 mg, HCTZ 25 mg, diltiazem 240 mg daily in addition to systolic.  He's not having any bleeding on anticoagulation.  I had reduced his simvastatin dose to 20 mg due to concomitant amiodarone administration.  His last LDL was 79.  In March 2018.  I've encouraged him to continue exercising.  If he notes recurrent palpitations.  He is to contact our office.  Otherwise, I will see him in 3 months for reevaluation.  Time spent: 25 minutes Troy Sine, MD, [facility]  09/23/2016 9:55 AM

## 2016-09-22 NOTE — Patient Instructions (Signed)
Medication Instructions:   CHANGE AMIODARONE TO 300 MG ONCE DAILY X ONE MONTH= 1 AND 1/2 OF THE 200 MG TABLET ONCE DAILY X ONE MONTH THEN DECREASE AMIODARONE TO 200 MG ONCE DAILY  STAY ON NEBIVOLOL  5 MG DAILY UNTIL AMIODARONE IS DOWN TO 200 MG DAILY AT THAT TIME INCREASE NEBIVOLOL TO 7.5 MG ONCE DAILY=1 AND 1/2 TABLET ONCE DAILY   Follow-Up:  Your physician recommends that you schedule a follow-up appointment in: [place]   If you need a refill on your cardiac medications before your next appointment, please call your pharmacy.

## 2016-12-22 ENCOUNTER — Ambulatory Visit (INDEPENDENT_AMBULATORY_CARE_PROVIDER_SITE_OTHER): Payer: Medicare Other | Admitting: Cardiovascular Disease

## 2016-12-22 ENCOUNTER — Encounter: Payer: Self-pay | Admitting: Cardiovascular Disease

## 2016-12-22 VITALS — BP 167/79 | HR 52 | Ht 70.0 in | Wt 190.0 lb

## 2016-12-22 DIAGNOSIS — G4733 Obstructive sleep apnea (adult) (pediatric): Secondary | ICD-10-CM

## 2016-12-22 DIAGNOSIS — I10 Essential (primary) hypertension: Secondary | ICD-10-CM | POA: Diagnosis not present

## 2016-12-22 DIAGNOSIS — Z9989 Dependence on other enabling machines and devices: Secondary | ICD-10-CM | POA: Diagnosis not present

## 2016-12-22 DIAGNOSIS — Z7901 Long term (current) use of anticoagulants: Secondary | ICD-10-CM

## 2016-12-22 DIAGNOSIS — I480 Paroxysmal atrial fibrillation: Secondary | ICD-10-CM | POA: Diagnosis not present

## 2016-12-22 DIAGNOSIS — Z79899 Other long term (current) drug therapy: Secondary | ICD-10-CM | POA: Diagnosis not present

## 2016-12-22 MED ORDER — SPIRONOLACTONE 25 MG PO TABS: 25.0000 mg | ORAL_TABLET | Freq: Every day | ORAL | 3 refills | Status: DC

## 2016-12-22 NOTE — Patient Instructions (Addendum)
Medication Instructions:  STOP HCTZ (hydrochlorathiazide)  START spironolactone (Aldactone) 25mg  daily-rx sent to pharmacy  Labwork: Please return for labs in 2 WEEKS (BMET).  You do NOT have to fast.   Our in office lab hours are Monday-Friday 8:30-4:30, closed for lunch 12:30-2 pm.  No appointment needed.   Follow-Up: Your physician recommends that you schedule a follow-up appointment in: 3 MONTHS with Dr. Claiborne Billings   Any Other Special Instructions Will Be Listed Below (If Applicable).     If you need a refill on your cardiac medications before your next appointment, please call your pharmacy.

## 2016-12-22 NOTE — Progress Notes (Signed)
Patient ID: Mario Palmer, male   DOB: [birth year], 76 y.o.   MRN: [id number]    Primary M.D.: Dr. Bryon Lions  HPI: Mario Palmer is a 76 y.o. male who presents for a 3 month follow-up evaluation and follow-up of his DC cardioversion  [name] has a history of hypertension, obstructive sleep apnea on CPAP therapy, GERD, and palpitations with atrial ectopy,. 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 He has history of hyperlipidemia and has been on simvastatin and had intermittently stopped this secondary to arthralgias.  An echo Doppler study in January 2015 revealed a normal systolic function with ejection fraction of 55-60%. There was evidence for mild aortic and mitral insufficiency. His left atrium was moderately dilated.   [name] was diagnosed with obstructive sleep apnea in 2010. He had mild sleep apnea with events worse in the supine position and during REM sleep. He has been on CPAP therapy since that time and initially he was started on an 8 cm water pressure but also this was later titrated to 10 cm.  He admits to 100% compliance.  His initial machine malfunctioned in 2017 and  He received a new AirSense 10 CPAP unit.  He has noticed significant improvement in this new machine.  Compared to his old one.  He admits to 100% compliance.  He goes to bed between 9:30 9:45 AM and wakes up once around 2:30 to the bathroom and typically sleeps 9-10 hours per night.  A download was obtained from his new machine from 05/07/2015 through 06/05/2015.  This reveals 100% compliance.  He is averaging 9 hours and 53 minutes per night.  He is set at a 10 cm water pressure.  His AHI was excellent at 1.1.  He uses a fullface mask.  There was no leak.    He admits to using CPAP 100% of the time and typically goes to bed between 9:30 and 10 AM and wakes up between 7:30 and 8:30 AM.  His DME company is [employer].  A rhythm disturbance.  He denies episodes of chest pressure.  Add  blood work done in October 2017 by Dr. Laurance Flatten in 2 days to be followed by Bryon Lions.  His LDL was slightly elevated at 102.  His PSA was slightly elevated but improved and he never did see a urologist.    When I saw him in March 2018 his ECG demonstrated atrial fibrillation.  He was in this of questionable duration.  At that time, I initiated anticoagulation witheliquis  5 mg twice a day.  I discontinued amlodipine and started Cardizem CD 180 mg.  He underwent an echo Doppler study on 06/23/2016 which showed an EF of 55-60%.  He did not have wall motion abnormalities.  There was mild aortic sclerosis with mildAR and mild focal calcification of the anterior mitral valve leaflet with trivial MR.  His right ventricle is mildly dilated.  His left atrium was severely dilated and measured 64 mm parasternally.  I initiated therapy with amiodarone as an antiarrhythmic agent, with the greatest likelihood for pharmacologic success.  I decrease Bystolic to 5 mg.  He started amiodarone at 200 mg first week and ultimately titrated this to 200 mg twice a day.  He has felt improved with control his ventricular rate.  He notices that his blood pressure is slightly increased on the reduced dose of Bystolic.  He admitted to 100% compliance with CPAP therapy.  He underwent successful DC cardioversion  by me on 08/18/2016 with recurrence of sinus rhythm.  He has noticed improved energy since restoration of sinus rhythm.  He is unaware of any significant ectopy but does note a rare palpitation.  He has been exercising and pedals on a stationary bike for at least 40 minutes without chest pain or shortness of breath.  Since I last saw him in June 2018, he is unaware of recurrent arrhythmia.  He denies chest pain or palpitations.  He admits to some constipation.  He continues to use CPAP with 100% compliance.  I obtained a download from August 21 through 12/20/2016.  This demonstrates 100% of use with an average sleep duration of 9  hours and 11 minutes.  His AHI is excellent at 1.7 on a 10 cm set pressure.  He feels well.  There is no significantly.  He presents for follow-up evaluation.  Past Medical History:  Diagnosis Date  . GERD (gastroesophageal reflux disease)   . High cholesterol   . Hx of echocardiogram 12/2010   EF 55%, There is Stage 1 diastolic dyfunction. Normal LV filling pressure. Aortic sclerosis with mild AI, mild TR  . Hypertension   . Palpitation    with ecotopy  . Sleep apnea     Past Surgical History:  Procedure Laterality Date  . CARDIOVERSION N/A 09/12/2016   Procedure: CARDIOVERSION;  Surgeon: Troy Sine, MD;  Location: [facility] ENDOSCOPY;  Service: Cardiovascular;  Laterality: N/A;    No Known Allergies  Current Outpatient Prescriptions  Medication Sig Dispense Refill  . amiodarone (PACERONE) 200 MG tablet Take 1 and 1/2 tablet once daily x 1 month then decrease to 200 mg once daily 60 tablet 6  . aspirin 81 MG chewable tablet Chew 81 mg by mouth daily.    Marland Kitchen BYSTOLIC 10 MG tablet Take 10 mg by mouth daily.    . Cholecalciferol (VITAMIN D) 2000 UNITS tablet Take 2,000 Units by mouth daily.    Marland Kitchen ELIQUIS 5 MG TABS tablet TAKE 1 TABLET BY MOUTH TWICE A DAY  180 tablet 1  . lisinopril (PRINIVIL,ZESTRIL) 40 MG tablet Take 40 mg by mouth daily.    . NON FORMULARY CPAP therapy    . Saccharomyces boulardii (PROBIOTIC) 250 MG CAPS Take 1 capsule by mouth daily.    . simvastatin (ZOCOR) 20 MG tablet Take 0.5 tablets by mouth daily.    . Vitamin E 400 units TABS Take 1 tablet by mouth daily.    Marland Kitchen diltiazem (CARDIZEM CD) 240 MG 24 hr capsule Take 1 capsule (240 mg total) by mouth daily. 30 capsule 6  . spironolactone (ALDACTONE) 25 MG tablet Take 1 tablet (25 mg total) by mouth daily. 90 tablet 3   No current facility-administered medications for this visit.     Social History   Social History  . Marital status: Married    Spouse name: N/A  . Number of children: N/A  . Years of education:  N/A   Occupational History  . Not on file.   Social History Main Topics  . Smoking status: Former Smoker    Types: Cigarettes  . Smokeless tobacco: Never Used     Comment: quit smoking in 1982  . Alcohol use No  . Drug use: No  . Sexual activity: Not on file   Other Topics Concern  . Not on file   Social History Narrative  . No narrative on file    No family history on file.  Social history is notable in  that he is married; 2 children and 2 grandchildren. 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 There is no tobacco or alcohol use.  ROS General: Negative; No fevers, chills, or night sweats;  HEENT: Negative; No changes in vision or hearing, sinus congestion, difficulty swallowing Pulmonary: Negative; No cough, wheezing, shortness of breath, hemoptysis Cardiovascular:  Negative; No chest pain, presyncope, syncope, GI: Negative; No nausea, vomiting, diarrhea, or abdominal pain GU: Positive for mild PSA elevation for which he was treated with an antibiotic.  Repeat laboratory will be forthcoming by Dr. Bryon Lions.  No dysuria, hematuria, or difficulty voiding Musculoskeletal: Negative; no myalgias, joint pain, or weakness Hematologic/Oncology: Negative; no easy bruising, bleeding Endocrine: Negative; no heat/cold intolerance; no diabetes Neuro: Negative; no changes in balance, headaches Skin: Negative; No rashes or skin lesions Psychiatric: Negative; No behavioral problems, depression Sleep: Positive for obstructive sleep apnea on CPAP therapy.  No snoring, daytime sleepiness, hypersomnolence, bruxism, restless legs, hypnogognic hallucinations, no cataplexy Other comprehensive 14 point system review is negative.  PE: BP (!) 167/79   Pulse (!) 52   Ht '5\' 10"'  (1.778 m)   Wt 190 lb (86.2 kg)   BMI 27.26 kg/m    Repeat blood pressure was 134/80  Wt Readings from Last 3 Encounters:  09/22/16 192 lb (87.1 kg)  09/12/16 198 lb (89.8 kg)  08/18/16 194 lb (88  kg)  General: Alert, oriented, no distress.  Skin: normal turgor, no rashes, warm and dry HEENT: Normocephalic, atraumatic. Pupils equal round and reactive to light; sclera anicteric; extraocular muscles intact;  Nose without nasal septal hypertrophy Mouth/Parynx benign; Mallinpatti scale 3 Neck: No JVD, no carotid bruits; normal carotid upstroke Lungs: clear to ausculatation and percussion; no wheezing or rales Chest wall: without tenderness to palpitation Heart: PMI not displaced, RRR, s1 s2 normal, 1/6 systolic murmur, no diastolic murmur, no rubs, gallops, thrills, or heaves Abdomen: soft, nontender; no hepatosplenomehaly, BS+; abdominal aorta nontender and not dilated by palpation. Back: no CVA tenderness Pulses 2+ Musculoskeletal: full range of motion, normal strength, no joint deformities Extremities: no clubbing cyanosis or edema, Homan's sign negative  Neurologic: grossly nonfocal; Cranial nerves grossly wnl Psychologic: Normal mood and affect    ECG (independently read by me): Sinus bradycardia 52 bpm.  QTC increased at 487 ms.  No CMV ST-T change.  June 2018 ECG (independently read by me): Normal sinus rhythm at 62 bpm.  Left axis deviation.  QTc interval 479 ms.  No significant ST-T changes  May 2018 ECG (independently read by me): atrial flib  /flutter with coarse laboratory wave.  07/13/2016 ECG (independently read by me): Atrial fibrillation with a ventricular rate in the 50s.  Mild RV conduction delay.  March 2018 ECG (independently read by me): Atrial fibrillation at 63 bpm.  QTc interval 427 ms.  March 2016 ECG (independently read by me): Sinus bradycardia 59 bpm.  Mild RV conduction delay.  Normal intervals.  March 2015 ECG: Normal sinus rhythm at 60 beats per minute. No ectopy. Nonspecific T changes in lead 3.  02/04/2013 ECG: Sinus bradycardia with PACs.  LABS:  BMP Latest Ref Rng & Units 09/04/2016 06/07/2016 06/03/2011  Glucose 65 - 99 mg/dL 73 76 117(H)  BUN  7 - 25 mg/dL '15 14 20  ' Creatinine 0.70 - 1.18 mg/dL 0.95 0.85 1.00  Sodium 135 - 146 mmol/L 140 143 143  Potassium 3.5 - 5.3 mmol/L 3.9 4.0 3.1(L)  Chloride 98 - 110 mmol/L 101 104 103  CO2  20 - 31 mmol/L 27 28 -  Calcium 8.6 - 10.3 mg/dL 9.3 9.8 -    Hepatic Function Latest Ref Rng & Units 06/07/2016 06/03/2011  Total Protein 6.1 - 8.1 g/dL 7.1 6.9  Albumin 3.6 - 5.1 g/dL 4.2 3.9  AST 10 - 35 U/L 18 30  ALT 9 - 46 U/L 18 23  Alk Phosphatase 40 - 115 U/L 54 59  Total Bilirubin 0.2 - 1.2 mg/dL 1.3(H) 0.8   CBC Latest Ref Rng & Units 09/04/2016 06/07/2016 06/03/2011  WBC 3.8 - 10.8 K/uL 7.1 6.7 -  Hemoglobin 13.2 - 17.1 g/dL 15.2 15.5 14.3  Hematocrit 38.5 - 50.0 % 45.6 46.1 42.0  Platelets 140 - 400 K/uL 241 206 -   Lab Results  Component Value Date   MCV 93.1 09/04/2016   MCV 91.8 06/07/2016   MCV 87.4 06/03/2011   Lab Results  Component Value Date   TSH 2.23 06/07/2016     BNP No results found for: PROBNP  Lipid Panel     Component Value Date/Time   CHOL 160 06/07/2016 1030   TRIG 138 06/07/2016 1030   HDL 53 06/07/2016 1030   CHOLHDL 3.0 06/07/2016 1030   VLDL 28 06/07/2016 1030   LDLCALC 79 06/07/2016 1030    RADIOLOGY: No results found.  IMPRESSION:   1. Medication management   2. Essential hypertension   3. Paroxysmal atrial fibrillation (HCC)   4. OSA on CPAP   5. Anticoagulated     ASSESSMENT AND PLAN: Mr. Goldie Dimmer is a 76 years old gentleman who has a history of hypertension, obstructive sleep apnea, palpitations with documented atrial ectopy, and hyperlipidemia.  He had developed atrial fibrillation/flutter, was on eliquis anticoagulation, and after titration of amiodarone, ultimately underwent successful DC cardioversion on 08/18/2016 with resolution of atrial flutter and restoration of sinus rhythm.  He feels improved and has more energy.  He continues to exercise and is riding a stationary bike without chest pain or shortness of breath.  When I  last saw him, I recommended he reduce his amiodarone dose down to 300 mg for one month and then down to 200 mg I recommended further titration of Bystolic up to 10 mg daily.  He is unaware of any recurrent AF.  His ECG today shows sinus rhythm on amiodarone 753 mg, Bystolic 10 mg, and diltiazem 240 mg.  He also is on lisinopril 40 mg daily and HCTZ.  His blood pressure is elevated.  I am recommending discontinuance of hydrochlorothiazide and in its place I will start spironolactone 25 mg for aldosterone blockade.  He continues to be on simvastatin for hyperlipidemia.  He will undergo repeat be met in 2 weeks.  He continues to use CPAP.  I reviewed his most recent download with him in detail.  This shows excellent compliance and his AHI is excellent at 1.7 per hour at a 10 cm set CPAP pressure.  I will see him in 3 months for reevaluation.  Time spent: 25 minutes Troy Sine, MD, [facility]  12/24/2016 9:20 PM

## 2016-12-31 ENCOUNTER — Other Ambulatory Visit: Payer: Self-pay | Admitting: Cardiovascular Disease

## 2017-01-03 DIAGNOSIS — Z79899 Other long term (current) drug therapy: Secondary | ICD-10-CM | POA: Diagnosis not present

## 2017-01-04 LAB — BASIC METABOLIC PANEL
BUN/Creatinine Ratio: 14 (ref 10–24)
BUN: 16 mg/dL (ref 8–27)
CO2: 24 mmol/L (ref 20–29)
CREATININE: 1.11 mg/dL (ref 0.76–1.27)
Calcium: 9.8 mg/dL (ref 8.6–10.2)
Chloride: 106 mmol/L (ref 96–106)
GFR calc Af Amer: 73 mL/min/{1.73_m2} (ref >59)
GFR, EST NON AFRICAN AMERICAN: 63 mL/min/{1.73_m2} (ref >59)
GLUCOSE: 75 mg/dL (ref 65–99)
Potassium: 4.8 mmol/L (ref 3.5–5.2)
SODIUM: 144 mmol/L (ref 134–144)

## 2017-01-31 DIAGNOSIS — G4730 Sleep apnea, unspecified: Secondary | ICD-10-CM | POA: Diagnosis not present

## 2017-01-31 DIAGNOSIS — H6123 Impacted cerumen, bilateral: Secondary | ICD-10-CM | POA: Diagnosis not present

## 2017-01-31 DIAGNOSIS — I10 Essential (primary) hypertension: Secondary | ICD-10-CM | POA: Diagnosis not present

## 2017-01-31 DIAGNOSIS — R972 Elevated prostate specific antigen [PSA]: Secondary | ICD-10-CM | POA: Diagnosis not present

## 2017-01-31 DIAGNOSIS — R21 Rash and other nonspecific skin eruption: Secondary | ICD-10-CM | POA: Diagnosis not present

## 2017-01-31 DIAGNOSIS — I480 Paroxysmal atrial fibrillation: Secondary | ICD-10-CM | POA: Diagnosis not present

## 2017-01-31 DIAGNOSIS — Z0000 Encounter for general adult medical examination without abnormal findings: Secondary | ICD-10-CM | POA: Diagnosis not present

## 2017-02-14 DIAGNOSIS — R972 Elevated prostate specific antigen [PSA]: Secondary | ICD-10-CM | POA: Diagnosis not present

## 2017-02-14 DIAGNOSIS — R21 Rash and other nonspecific skin eruption: Secondary | ICD-10-CM | POA: Diagnosis not present

## 2017-02-26 DIAGNOSIS — G4733 Obstructive sleep apnea (adult) (pediatric): Secondary | ICD-10-CM | POA: Diagnosis not present

## 2017-03-18 ENCOUNTER — Other Ambulatory Visit: Payer: Self-pay | Admitting: Cardiovascular Disease

## 2017-03-22 ENCOUNTER — Ambulatory Visit: Payer: Medicare Other | Admitting: Cardiovascular Disease

## 2017-03-22 VITALS — BP 150/82 | HR 63 | Ht 70.0 in | Wt 187.0 lb

## 2017-03-22 DIAGNOSIS — E7800 Pure hypercholesterolemia, unspecified: Secondary | ICD-10-CM | POA: Diagnosis not present

## 2017-03-22 DIAGNOSIS — G4733 Obstructive sleep apnea (adult) (pediatric): Secondary | ICD-10-CM

## 2017-03-22 DIAGNOSIS — I10 Essential (primary) hypertension: Secondary | ICD-10-CM | POA: Diagnosis not present

## 2017-03-22 DIAGNOSIS — Z9989 Dependence on other enabling machines and devices: Secondary | ICD-10-CM

## 2017-03-22 DIAGNOSIS — Z7901 Long term (current) use of anticoagulants: Secondary | ICD-10-CM

## 2017-03-22 DIAGNOSIS — I480 Paroxysmal atrial fibrillation: Secondary | ICD-10-CM

## 2017-03-22 MED ORDER — BYSTOLIC 5 MG PO TABS: 7.5000 mg | ORAL_TABLET | Freq: Every day | ORAL | 11 refills | Status: DC

## 2017-03-22 MED ORDER — AMIODARONE HCL 200 MG PO TABS: ORAL_TABLET | ORAL | 6 refills | Status: DC

## 2017-03-22 NOTE — Patient Instructions (Addendum)
Your physician has recommended you make the following change in your medication:  DECREASE AMIODARONE TO 200 MG EVERY DAY  AND  INCREASE BYSTOLIC TO 7.5 MG EV Your physician recommends that you schedule a follow-up appointment in:  [place]

## 2017-03-22 NOTE — Progress Notes (Signed)
Patient ID: Mario Palmer, male   DOB: 1940/10/20, 76 y.o.   MRN: [id number]    Primary M.D.: Dr. Bryon Lions  HPI: Mario Palmer is a 76 y.o. male who presents for a 3 month follow-up evaluation.  Mario Palmer has a history of hypertension, obstructive sleep apnea on CPAP therapy, GERD, and palpitations with atrial ectopy,. 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 He has history of hyperlipidemia and has been on simvastatin and had intermittently stopped this secondary to arthralgias.  An echo Doppler study in January 2015 revealed a normal systolic function with ejection fraction of 55-60%. There was evidence for mild aortic and mitral insufficiency. His left atrium was moderately dilated.   Mario Palmer was diagnosed with obstructive sleep apnea in 2010. He had mild sleep apnea with events worse in the supine position and during REM sleep. He has been on CPAP therapy since that time and initially he was started on an 8 cm water pressure but also this was later titrated to 10 cm.  He admits to 100% compliance.  His initial machine malfunctioned in 2017 and  He received a new AirSense 10 CPAP unit.  He has noticed significant improvement in this new machine.  Compared to his old one.  He admits to 100% compliance.  He goes to bed between 9:30 9:45 AM and wakes up once around 2:30 to the bathroom and typically sleeps 9-10 hours per night.  A download was obtained from his new machine from 05/07/2015 through 06/05/2015.  This reveals 100% compliance.  He is averaging 9 hours and 53 minutes per night.  He is set at a 10 cm water pressure.  His AHI was excellent at 1.1.  He uses a fullface mask.  There was no leak.    He admits to using CPAP 100% of the time and typically goes to bed between 9:30 and 10 AM and wakes up between 7:30 and 8:30 AM.  His DME company is [employer].  A rhythm disturbance.  He denies episodes of chest pressure.  Add blood work done in October 2017 by  Dr. Laurance Flatten in 2 days to be followed by Bryon Lions.  His LDL was slightly elevated at 102.  His PSA was slightly elevated but improved and he never did see a urologist.    When I saw him in March 2018 his ECG demonstrated atrial fibrillation.  He was in this of questionable duration.  At that time, I initiated anticoagulation witheliquis  5 mg twice a day.  I discontinued amlodipine and started Cardizem CD 180 mg.  He underwent an echo Doppler study on 06/23/2016 which showed an EF of 55-60%.  He did not have wall motion abnormalities.  There was mild aortic sclerosis with mildAR and mild focal calcification of the anterior mitral valve leaflet with trivial MR.  His right ventricle is mildly dilated.  His left atrium was severely dilated and measured 64 mm parasternally.  I initiated therapy with amiodarone as an antiarrhythmic agent, with the greatest likelihood for pharmacologic success.  I decrease Bystolic to 5 mg.  He started amiodarone at 200 mg first week and ultimately titrated this to 200 mg twice a day.  He has felt improved with control his ventricular rate.  He notices that his blood pressure is slightly increased on the reduced dose of Bystolic.  He admitted to 100% compliance with CPAP therapy.  He underwent successful DC cardioversion by me on 08/18/2016 with recurrence  of sinus rhythm.  He has noticed improved energy since restoration of sinus rhythm.  He is unaware of any significant ectopy but does note a rare palpitation.  He has been exercising and pedals on a stationary bike for at least 40 minutes without chest pain or shortness of breath.  Since I last saw him in September 2018.  He is unaware of recurrent atrial fibrillation.  He does admit to occasional palpitations at night.  Apparently, he had been on Bystolic 10 mg but inadvertently when he called his primary physician Bystolic was renewed at 5 mg instead of the 10 mg dose.  The palpitations seem to have occurred on the reduced  dose.  He continues to use CPAP with 100% compliance.   A download from August 21 through 12/20/2016 revealed 100% of use with an average sleep duration of 9 hours and 11 minutes.  His AHI is excellent at 1.7 on a 10 cm set pressure.  He has continued to take amiodarone 300 mg daily, Cartia 240 mg, lisinopril 40 mg in addition to spironolactone 25 mg daily for hypertension.  His dose of Bystolic as mentioned above, was inadvertently reduced to 5 mg.  He presents for reevaluation.  Past Medical History:  Diagnosis Date  . GERD (gastroesophageal reflux disease)   . High cholesterol   . Hx of echocardiogram 12/2010   EF 55%, There is Stage 1 diastolic dyfunction. Normal LV filling pressure. Aortic sclerosis with mild AI, mild TR  . Hypertension   . Palpitation    with ecotopy  . Sleep apnea     Past Surgical History:  Procedure Laterality Date  . CARDIOVERSION N/A 09/12/2016   Procedure: CARDIOVERSION;  Surgeon: Troy Sine, MD;  Location: [facility] ENDOSCOPY;  Service: Cardiovascular;  Laterality: N/A;    No Known Allergies  Current Outpatient Medications  Medication Sig Dispense Refill  . amiodarone (PACERONE) 200 MG tablet 1 DAILY 60 tablet 6  . aspirin 81 MG chewable tablet Chew 81 mg by mouth daily.    Marland Kitchen BYSTOLIC 5 MG tablet Take 1.5 tablets (7.5 mg total) by mouth daily. 45 tablet 11  . CARTIA XT 240 MG 24 hr capsule take 1 capsule by mouth daily 30 capsule 8  . Cholecalciferol (VITAMIN D) 2000 UNITS tablet Take 2,000 Units by mouth daily.    Marland Kitchen ELIQUIS 5 MG TABS tablet TAKE ONE TABLET BY MOUTH TWICE DAILY  180 tablet 1  . lisinopril (PRINIVIL,ZESTRIL) 40 MG tablet Take 40 mg by mouth daily.    . NON FORMULARY CPAP therapy    . Saccharomyces boulardii (PROBIOTIC) 250 MG CAPS Take 1 capsule by mouth daily.    . simvastatin (ZOCOR) 20 MG tablet Take 0.5 tablets by mouth daily.    Marland Kitchen spironolactone (ALDACTONE) 25 MG tablet Take 1 tablet (25 mg total) by mouth daily. 90 tablet 3  .  Vitamin E 400 units TABS Take 1 tablet by mouth daily.     No current facility-administered medications for this visit.     Social History   Socioeconomic History  . Marital status: Married    Spouse name: Not on file  . Number of children: Not on file  . Years of education: Not on file  . Highest education level: Not on file  Social Needs  . Financial resource strain: Not on file  . Food insecurity - worry: Not on file  . Food insecurity - inability: Not on file  . Transportation needs - medical: Not on  file  . Transportation needs - non-medical: Not on file  Occupational History  . Not on file  Tobacco Use  . Smoking status: Former Smoker    Types: Cigarettes  . Smokeless tobacco: Never Used  . Tobacco comment: quit smoking in 1982  Substance and Sexual Activity  . Alcohol use: No  . Drug use: No  . Sexual activity: Not on file  Other Topics Concern  . Not on file  Social History Narrative  . Not on file    No family history on file.  Social history is notable in that he is married; 2 children and 2 grandchildren. 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 There is no tobacco or alcohol use.  ROS General: Negative; No fevers, chills, or night sweats;  HEENT: Negative; No changes in vision or hearing, sinus congestion, difficulty swallowing Pulmonary: Negative; No cough, wheezing, shortness of breath, hemoptysis Cardiovascular:  Negative; No chest pain, presyncope, syncope, GI: Negative; No nausea, vomiting, diarrhea, or abdominal pain GU: Positive for mild PSA elevation for which he was treated with an antibiotic.  Repeat laboratory will be forthcoming by Dr. Bryon Lions.  No dysuria, hematuria, or difficulty voiding Musculoskeletal: Negative; no myalgias, joint pain, or weakness Hematologic/Oncology: Negative; no easy bruising, bleeding Endocrine: Negative; no heat/cold intolerance; no diabetes Neuro: Negative; no changes in balance, headaches Skin:  Negative; No rashes or skin lesions Psychiatric: Negative; No behavioral problems, depression Sleep: Positive for obstructive sleep apnea on CPAP therapy.  No snoring, daytime sleepiness, hypersomnolence, bruxism, restless legs, hypnogognic hallucinations, no cataplexy Other comprehensive 14 point system review is negative.  PE: BP (!) 150/82   Pulse 63   Ht 5' 10" (1.778 m)   Wt 187 lb (84.8 kg)   BMI 26.83 kg/m    Repeat blood pressure by me was 134/82  Wt Readings from Last 3 Encounters:  09/22/16 192 lb (87.1 kg)  09/12/16 198 lb (89.8 kg)  08/18/16 194 lb (88 kg)     Physical Exam BP (!) 150/82   Pulse 63   Ht 5' 10" (1.778 m)   Wt 187 lb (84.8 kg)   BMI 26.83 kg/m  General: Alert, oriented, no distress.  Skin: normal turgor, no rashes, warm and dry HEENT: Normocephalic, atraumatic. Pupils equal round and reactive to light; sclera anicteric; extraocular muscles intact;  Nose without nasal septal hypertrophy Mouth/Parynx benign; Mallinpatti scale 3 Neck: No JVD, no carotid bruits; normal carotid upstroke Lungs: clear to ausculatation and percussion; no wheezing or rales Chest wall: without tenderness to palpitation Heart: PMI not displaced, RRR, s1 s2 normal, 1/6 systolic murmur, no diastolic murmur, no rubs, gallops, thrills, or heaves Abdomen: soft, nontender; no hepatosplenomehaly, BS+; abdominal aorta nontender and not dilated by palpation. Back: no CVA tenderness Pulses 2+ Musculoskeletal: full range of motion, normal strength, no joint deformities Extremities: no clubbing cyanosis or edema, Homan's sign negative  Neurologic: grossly nonfocal; Cranial nerves grossly wnl Psychologic: Normal mood and affect  ECG (independently read by me):  Normal sinus rhythm at 63 bpm. PR interval 235m.  QTc nterval 466 ms. No significant ST-T changes.  September 2018 ECG (independently read by me): Sinus bradycardia 52 bpm.  QTC increased at 487 ms.  No CMV ST-T  change.  June 2018 ECG (independently read by me): Normal sinus rhythm at 62 bpm.  Left axis deviation.  QTc interval 479 ms.  No significant ST-T changes  May 2018 ECG (independently read by me): atrial flib  /Philippa Chester  with coarse laboratory wave.  07/13/2016 ECG (independently read by me): Atrial fibrillation with a ventricular rate in the 50s.  Mild RV conduction delay.  March 2018 ECG (independently read by me): Atrial fibrillation at 63 bpm.  QTc interval 427 ms.  March 2016 ECG (independently read by me): Sinus bradycardia 59 bpm.  Mild RV conduction delay.  Normal intervals.  March 2015 ECG: Normal sinus rhythm at 60 beats per minute. No ectopy. Nonspecific T changes in lead 3.  02/04/2013 ECG: Sinus bradycardia with PACs.  LABS:  BMP Latest Ref Rng & Units 01/03/2017 09/04/2016 06/07/2016  Glucose 65 - 99 mg/dL 75 73 76  BUN 8 - 27 mg/dL _0 Creatinine 0.76 - 1.27 mg/dL 1.11 0.95 0.85  BUN/Creat Ratio 10 - 24 14 - -  Sodium 134 - 144 mmol/L 144 140 143  Potassium 3.5 - 5.2 mmol/L 4.8 3.9 4.0  Chloride 96 - 106 mmol/L 106 101 104  CO2 20 - 29 mmol/L _1 Calcium 8.6 - 10.2 mg/dL 9.8 9.3 9.8    Hepatic Function Latest Ref Rng & Units 06/07/2016 06/03/2011  Total Protein 6.1 - 8.1 g/dL 7.1 6.9  Albumin 3.6 - 5.1 g/dL 4.2 3.9  AST 10 - 35 U/L 18 30  ALT 9 - 46 U/L 18 23  Alk Phosphatase 40 - 115 U/L 54 59  Total Bilirubin 0.2 - 1.2 mg/dL 1.3(H) 0.8   CBC Latest Ref Rng & Units 09/04/2016 06/07/2016 06/03/2011  WBC 3.8 - 10.8 K/uL 7.1 6.7 -  Hemoglobin 13.2 - 17.1 g/dL 15.2 15.5 14.3  Hematocrit 38.5 - 50.0 % 45.6 46.1 42.0  Platelets 140 - 400 K/uL 241 206 -   Lab Results  Component Value Date   MCV 93.1 09/04/2016   MCV 91.8 06/07/2016   MCV 87.4 06/03/2011   Lab Results  Component Value Date   TSH 2.23 06/07/2016     BNP No results found for: PROBNP  Lipid Panel     Component Value Date/Time   CHOL 160 06/07/2016 1030   TRIG 138 06/07/2016 1030   HDL  53 06/07/2016 1030   CHOLHDL 3.0 06/07/2016 1030   VLDL 28 06/07/2016 1030   LDLCALC 79 06/07/2016 1030    RADIOLOGY: No results found.  IMPRESSION:   1. Essential hypertension   2. Paroxysmal atrial fibrillation (HCC)   3. OSA on CPAP   4. Anticoagulated   5. Pure hypercholesterolemia     ASSESSMENT AND PLAN: Mario Palmer is a 76 years old gentleman who has a history of hypertension, obstructive sleep apnea, palpitations with documented atrial ectopy, and hyperlipidemia.  He had developed atrial fibrillation/flutter, was on eliquis anticoagulation, and after titration of amiodarone, ultimately underwent successful DC cardioversion on 08/18/2016 with resolution of atrial flutter and restoration of sinus rhythm.  He feels improved and has more energy.  He continues to exercise and is riding a stationary bike without chest pain or shortness of breath.  When I previously seen him, I reduced his amiodarone to 300 mg and titrated Bystolic to 10 mg.  This had controlled his palpitations and recurrent arrhythmia.  Apparently he called his primary care office for renewal of the Bystolic prescription and the patient admits he made an error and told them that he was taking 5 mg instead of 10 mg dosing.  On his reduced 5 mg dose.  He has noted more nocturnal palpitations which have been isolated.  He never reduced his amiodarone  down to 200 mg.  I will now reduce his amiodarone to 2 mg daily and will increase Bystolic to 7.5 mg and after 1 week if he still notes nocturnal palpitations.  He will titrate back to 10 mg daily.  He continues to use CPAP with 100% compliance.  He's not having any anginal symptomatology.  His blood pressure today is controlled on his multiple drug regimen as noted above.  He is on simvastatin for hyperlipidemia and denies myalgias.  Recent laboratory was reviewed.  I will see him in 4 months for cardiology reevaluation.    Time spent: 25 minutes Troy Sine, MD,  [facility]  03/24/2017 10:53 AM

## 2017-03-24 ENCOUNTER — Encounter: Payer: Self-pay | Admitting: Cardiovascular Disease

## 2017-04-09 DIAGNOSIS — Z961 Presence of intraocular lens: Secondary | ICD-10-CM | POA: Diagnosis not present

## 2017-04-09 DIAGNOSIS — H35371 Puckering of macula, right eye: Secondary | ICD-10-CM | POA: Diagnosis not present

## 2017-04-09 DIAGNOSIS — H35372 Puckering of macula, left eye: Secondary | ICD-10-CM | POA: Diagnosis not present

## 2017-05-11 ENCOUNTER — Ambulatory Visit: Payer: Medicare Other | Admitting: Cardiovascular Disease

## 2017-05-11 ENCOUNTER — Encounter: Payer: Self-pay | Admitting: Cardiovascular Disease

## 2017-05-11 VITALS — BP 122/68 | HR 57 | Ht 70.0 in | Wt 192.0 lb

## 2017-05-11 DIAGNOSIS — Z9989 Dependence on other enabling machines and devices: Secondary | ICD-10-CM | POA: Diagnosis not present

## 2017-05-11 DIAGNOSIS — G4733 Obstructive sleep apnea (adult) (pediatric): Secondary | ICD-10-CM | POA: Diagnosis not present

## 2017-05-11 DIAGNOSIS — I10 Essential (primary) hypertension: Secondary | ICD-10-CM

## 2017-05-11 DIAGNOSIS — Z7901 Long term (current) use of anticoagulants: Secondary | ICD-10-CM

## 2017-05-11 DIAGNOSIS — I480 Paroxysmal atrial fibrillation: Secondary | ICD-10-CM | POA: Diagnosis not present

## 2017-05-11 NOTE — Patient Instructions (Addendum)
Medication Instructions:  Your physician recommends that you continue on your current medications as directed. Please refer to the Current Medication list given to you today.  Follow-Up: April as scheduled.  Any Other Special Instructions Will Be Listed Below (If Applicable).  The humidity level has been increased on your CPAP device.  Please call us next week if you continue to have issues.    If you need a refill on your cardiac medications before your next appointment, please call your pharmacy.

## 2017-05-11 NOTE — Progress Notes (Signed)
Patient ID: Mario Palmer, male   DOB: [birth year], 77 y.o.   MRN: [id number]    Primary M.D.: Dr. Bryon Lions  HPI: Mario Palmer is a 77 y.o. male who presents for a 2 month follow-up evaluation.  Mario Palmer has a history of hypertension, obstructive sleep apnea on CPAP therapy, GERD, and palpitations with atrial ectopy,. 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 He has history of hyperlipidemia and has been on simvastatin and had intermittently stopped this secondary to arthralgias.  An echo Doppler study in January 2015 revealed a normal systolic function with ejection fraction of 55-60%. There was evidence for mild aortic and mitral insufficiency. His left atrium was moderately dilated.   Mario Palmer was diagnosed with obstructive sleep apnea in 2010. He had mild sleep apnea with events worse in the supine position and during REM sleep. He has been on CPAP therapy since that time and initially he was started on an 8 cm water pressure but also this was later titrated to 10 cm.  He admits to 100% compliance.  His initial machine malfunctioned in 2017 and  He received a new AirSense 10 CPAP unit.  He has noticed significant improvement in this new machine.  Compared to his old one.  He admits to 100% compliance.  He goes to bed between 9:30 9:45 AM and wakes up once around 2:30 to the bathroom and typically sleeps 9-10 hours per night.  A download was obtained from his new machine from 05/07/2015 through 06/05/2015.  This reveals 100% compliance.  He is averaging 9 hours and 53 minutes per night.  He is set at a 10 cm water pressure.  His AHI was excellent at 1.1.  He uses a fullface mask.  There was no leak.    He admits to using CPAP 100% of the time and typically goes to bed between 9:30 and 10 AM and wakes up between 7:30 and 8:30 AM.  His DME company is [facility] [employer].  A rhythm disturbance.  He denies episodes of chest pressure.  Add blood work done in October 2017 by  Dr. Laurance Flatten in 2 days to be followed by Bryon Lions.  His LDL was slightly elevated at 102.  His PSA was slightly elevated but improved and he never did see a urologist.    When I saw him in March 2018 his ECG demonstrated atrial fibrillation.  He was in this of questionable duration.  At that time, I initiated anticoagulation witheliquis  5 mg twice a day.  I discontinued amlodipine and started Cardizem CD 180 mg.  He underwent an echo Doppler study on 06/23/2016 which showed an EF of 55-60%.  He did not have wall motion abnormalities.  There was mild aortic sclerosis with mildAR and mild focal calcification of the anterior mitral valve leaflet with trivial MR.  His right ventricle is mildly dilated.  His left atrium was severely dilated and measured 64 mm parasternally.  I initiated therapy with amiodarone as an antiarrhythmic agent, with the greatest likelihood for pharmacologic success.  I decrease Bystolic to 5 mg.  He started amiodarone at 200 mg first week and ultimately titrated this to 200 mg twice a day.  He has felt improved with control his ventricular rate.  He notices that his blood pressure is slightly increased on the reduced dose of Bystolic.  He admitted to 100% compliance with CPAP therapy.  He underwent successful DC cardioversion by me on 08/18/2016 with recurrence  of sinus rhythm.  He has noticed improved energy since restoration of sinus rhythm.  He is unaware of any significant ectopy but does note a rare palpitation.  He has been exercising and pedals on a stationary bike for at least 40 minutes without chest pain or shortness of breath.  When  saw him in September 2018 he was unaware of recurrent atrial fibrillation.  He does admit to occasional palpitations at night.  Apparently, he had been on Bystolic 10 mg but inadvertently when he called his primary physician Bystolic was renewed at 5 mg instead of the 10 mg dose.  The palpitations seem to have occurred on the reduced dose.  I  last saw him in December and resume bystolic at 10 mg.  This has improved his palpitations and blood pressure.  He continues to use CPAP with 100% compliance.   A download from August 21 through 12/20/2016 revealed 100% of use with an average sleep duration of 9 hours and 11 minutes.  His AHI is excellent at 1.7 on a 10 cm set pressure.  His amiodarone dose was reduced in December and he now is on 200 mg daily, in addition to Cartia 240 mg, lisinopril 40 mg in addition to spironolactone 25 mg daily for hypertension.  He recently called the office with complaints of his tongue becoming exceedingly dry.  After several hours of CPAP use.  He has a fullface mask.  He breeze through his mouth and remotely had used a nasal mask was switched to fullface mask due to oral venting.  Typically in the morning.  He notes that his nose is congested.  Be very dry mouth has made it difficult for him to continue to sleep.  He is worked in and seen as an add-on for evaluation today.  Past Medical History:  Diagnosis Date  . GERD (gastroesophageal reflux disease)   . High cholesterol   . Hx of echocardiogram 12/2010   EF 55%, There is Stage 1 diastolic dyfunction. Normal LV filling pressure. Aortic sclerosis with mild AI, mild TR  . Hypertension   . Palpitation    with ecotopy  . Sleep apnea     Past Surgical History:  Procedure Laterality Date  . CARDIOVERSION N/A 09/12/2016   Procedure: CARDIOVERSION;  Surgeon: Troy Sine, MD;  Location: [facility] ENDOSCOPY;  Service: Cardiovascular;  Laterality: N/A;    No Known Allergies  Current Outpatient Medications  Medication Sig Dispense Refill  . amiodarone (PACERONE) 200 MG tablet 1 DAILY 60 tablet 6  . aspirin 81 MG chewable tablet Chew 81 mg by mouth daily.    Marland Kitchen CARTIA XT 240 MG 24 hr capsule take 1 capsule by mouth daily 30 capsule 8  . Cholecalciferol (VITAMIN D) 2000 UNITS tablet Take 2,000 Units by mouth daily.    Marland Kitchen ELIQUIS 5 MG TABS tablet TAKE ONE TABLET  BY MOUTH TWICE DAILY  180 tablet 1  . lisinopril (PRINIVIL,ZESTRIL) 40 MG tablet Take 40 mg by mouth daily.    . nebivolol (BYSTOLIC) 10 MG tablet Take 10 mg by mouth daily.    . NON FORMULARY CPAP therapy    . Saccharomyces boulardii (PROBIOTIC) 250 MG CAPS Take 1 capsule by mouth daily.    . simvastatin (ZOCOR) 20 MG tablet Take 0.5 tablets by mouth daily.    Marland Kitchen spironolactone (ALDACTONE) 25 MG tablet Take 25 mg by mouth daily.    . Vitamin E 400 units TABS Take 1 tablet by mouth daily.  No current facility-administered medications for this visit.     Social History   Socioeconomic History  . Marital status: Married    Spouse name: Not on file  . Number of children: Not on file  . Years of education: Not on file  . Highest education level: Not on file  Social Needs  . Financial resource strain: Not on file  . Food insecurity - worry: Not on file  . Food insecurity - inability: Not on file  . Transportation needs - medical: Not on file  . Transportation needs - non-medical: Not on file  Occupational History  . Not on file  Tobacco Use  . Smoking status: Former Smoker    Types: Cigarettes  . Smokeless tobacco: Never Used  . Tobacco comment: quit smoking in 1982  Substance and Sexual Activity  . Alcohol use: No  . Drug use: No  . Sexual activity: Not on file  Other Topics Concern  . Not on file  Social History Narrative  . Not on file    No family history on file.  Social history is notable in that he is married; 2 children and 2 grandchildren. 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 There is no tobacco or alcohol use.  ROS General: Negative; No fevers, chills, or night sweats;  HEENT: Negative; No changes in vision or hearing, sinus congestion, difficulty swallowing Pulmonary: Negative; No cough, wheezing, shortness of breath, hemoptysis Cardiovascular:  Negative; No chest pain, presyncope, syncope, GI: Negative; No nausea, vomiting, diarrhea, or  abdominal pain GU: Positive for mild PSA elevation for which he was treated with an antibiotic.  Repeat laboratory will be forthcoming by Dr. Bryon Lions.  No dysuria, hematuria, or difficulty voiding Musculoskeletal: Negative; no myalgias, joint pain, or weakness Hematologic/Oncology: Negative; no easy bruising, bleeding Endocrine: Negative; no heat/cold intolerance; no diabetes Neuro: Negative; no changes in balance, headaches Skin: Negative; No rashes or skin lesions Psychiatric: Negative; No behavioral problems, depression Sleep: Positive for obstructive sleep apnea on CPAP therapy.  No snoring, daytime sleepiness, hypersomnolence, bruxism, restless legs, hypnogognic hallucinations, no cataplexy Other comprehensive 14 point system review is negative.  PE: BP 122/68   Pulse (!) 57   Ht _0  (1.778 m)   Wt 192 lb (87.1 kg)   BMI 27.55 kg/m    Repeat blood pressure by me was 120/70  Wt Readings from Last 3 Encounters:  09/22/16 192 lb (87.1 kg)  09/12/16 198 lb (89.8 kg)  08/18/16 194 lb (88 kg)   General: Alert, oriented, no distress.  Skin: normal turgor, no rashes, warm and dry HEENT: Normocephalic, atraumatic. Pupils equal round and reactive to light; sclera anicteric; extraocular muscles intact;  Nose without nasal septal hypertrophy Mouth/Parynx benign; Mallinpatti scale 3 Neck: No JVD, no carotid bruits; normal carotid upstroke Lungs: clear to ausculatation and percussion; no wheezing or rales Chest wall: without tenderness to palpitation Heart: PMI not displaced, RRR, s1 s2 normal, 1/6 systolic murmur, no diastolic murmur, no rubs, gallops, thrills, or heaves Abdomen: soft, nontender; no hepatosplenomehaly, BS+; abdominal aorta nontender and not dilated by palpation. Back: no CVA tenderness Pulses 2+ Musculoskeletal: full range of motion, normal strength, no joint deformities Extremities: no clubbing cyanosis or edema, Homan's sign negative  Neurologic: grossly  nonfocal; Cranial nerves grossly wnl Psychologic: Normal mood and affect   ECG (independently read by me): Sinus bradycardia at 67 bpm.  First degree AV block with a PR interval 214 ms.  QTC normal 434 ms.  December 2018 ECG (independently read by me):  Normal sinus rhythm at 63 bpm. PR interval 273m.  QTc nterval 466 ms. No significant ST-T changes.  September 2018 ECG (independently read by me): Sinus bradycardia 52 bpm.  QTC increased at 487 ms.  No CMV ST-T change.  June 2018 ECG (independently read by me): Normal sinus rhythm at 62 bpm.  Left axis deviation.  QTc interval 479 ms.  No significant ST-T changes  May 2018 ECG (independently read by me): atrial flib  /flutter with coarse laboratory wave.  07/13/2016 ECG (independently read by me): Atrial fibrillation with a ventricular rate in the 50s.  Mild RV conduction delay.  March 2018 ECG (independently read by me): Atrial fibrillation at 63 bpm.  QTc interval 427 ms.  March 2016 ECG (independently read by me): Sinus bradycardia 59 bpm.  Mild RV conduction delay.  Normal intervals.  March 2015 ECG: Normal sinus rhythm at 60 beats per minute. No ectopy. Nonspecific T changes in lead 3.  02/04/2013 ECG: Sinus bradycardia with PACs.  LABS:  BMP Latest Ref Rng & Units 01/03/2017 09/04/2016 06/07/2016  Glucose 65 - 99 mg/dL 75 73 76  BUN 8 - 27 mg/dL _0 Creatinine 0.76 - 1.27 mg/dL 1.11 0.95 0.85  BUN/Creat Ratio 10 - 24 14 - -  Sodium 134 - 144 mmol/L 144 140 143  Potassium 3.5 - 5.2 mmol/L 4.8 3.9 4.0  Chloride 96 - 106 mmol/L 106 101 104  CO2 20 - 29 mmol/L _1 Calcium 8.6 - 10.2 mg/dL 9.8 9.3 9.8    Hepatic Function Latest Ref Rng & Units 06/07/2016 06/03/2011  Total Protein 6.1 - 8.1 g/dL 7.1 6.9  Albumin 3.6 - 5.1 g/dL 4.2 3.9  AST 10 - 35 U/L 18 30  ALT 9 - 46 U/L 18 23  Alk Phosphatase 40 - 115 U/L 54 59  Total Bilirubin 0.2 - 1.2 mg/dL 1.3(H) 0.8   CBC Latest Ref Rng & Units 09/04/2016 06/07/2016 06/03/2011    WBC 3.8 - 10.8 K/uL 7.1 6.7 -  Hemoglobin 13.2 - 17.1 g/dL 15.2 15.5 14.3  Hematocrit 38.5 - 50.0 % 45.6 46.1 42.0  Platelets 140 - 400 K/uL 241 206 -   Lab Results  Component Value Date   MCV 93.1 09/04/2016   MCV 91.8 06/07/2016   MCV 87.4 06/03/2011   Lab Results  Component Value Date   TSH 2.23 06/07/2016     BNP No results found for: PROBNP  Lipid Panel     Component Value Date/Time   CHOL 160 06/07/2016 1030   TRIG 138 06/07/2016 1030   HDL 53 06/07/2016 1030   CHOLHDL 3.0 06/07/2016 1030   VLDL 28 06/07/2016 1030   LDLCALC 79 06/07/2016 1030    RADIOLOGY: No results found.  IMPRESSION:   1. Essential hypertension   2. Paroxysmal atrial fibrillation (HCC)   3. OSA on CPAP   4. Anticoagulated     ASSESSMENT AND PLAN: Mario Palmer a 77years old gentleman who has a history of hypertension, obstructive sleep apnea, palpitations with documented atrial ectopy, and hyperlipidemia.  He had developed atrial fibrillation/flutter, was on eliquis anticoagulation, and after titration of amiodarone, ultimately underwent successful DC cardioversion on 08/18/2016 with resolution of atrial flutter and restoration of sinus rhythm.  His ECG today reveals continued sinus rhythm and is tolerating the reduced dose of amiodarone at 200 mg daily.  He is not having any palpitations on his  increased Bystolic dose.  His blood pressure today is well controlled on lisinopril 40 mg, Bystolic 10 mg, and spironolactone 25 mg.  With his sleep complaints, a download was obtained in the office today from 04/11/2017 through 05/10/2017.  He is 100% compliant in his averaging 7 hours and 18 minutes of daily CPAP use.  At a 10 cm water pressure, AHI is very good at 2.7.  His major complaint is that of exceedingly dry mouth and tongue which occurs 2-3 hours after initiation of sleep.  We interrogated his Maryfrances Bunnell and it appears that his humidification is set at auto which ideally should adjust  depending upon his medication needs.  However, with his complaints, I will change this to a manual mode and increasing this to 6.  He will contact us next week to tell us if he notes any improvement.  I discussed with him that if he was developing water in his tubing, that the humidification level wound will be to high and will need to be reduced.  I suspect his dryness is most consistent with the winter months and dry air.  He is not sleepy.  An Epworth Sleepiness Scale today endorsed at 2.  He already has an appointment to see me in April and I've suggested that he continue that for follow-up assessment.  Time spent: 25 minutes Troy Sine, MD, [facility]  05/11/2017 12:23 PM

## 2017-05-14 ENCOUNTER — Telehealth: Payer: Self-pay | Admitting: Cardiovascular Disease

## 2017-05-14 NOTE — Telephone Encounter (Signed)
Will forward to Dr Kelly for review   

## 2017-05-14 NOTE — Telephone Encounter (Signed)
New message    Patient would like to know if he can increase the humidity on his cpap machine, the increase you did last week did not help.

## 2017-05-14 NOTE — Telephone Encounter (Signed)
We will increase his humidification to see if this helps.  If he starts to develop water in his tubing then he will need to be reduced.  Continue with nasal saline and adequate hydration

## 2017-05-15 NOTE — Telephone Encounter (Signed)
Patient aware and verbalized understanding.    Humidity increased to 7 via [employer].

## 2017-05-16 ENCOUNTER — Telehealth: Payer: Self-pay | Admitting: Cardiovascular Disease

## 2017-05-16 NOTE — Telephone Encounter (Signed)
Mario Palmer is calling about his Cpap machine . Please call   Thanks

## 2017-05-17 NOTE — Telephone Encounter (Signed)
Returned a call to patient and spoke with his wife , who answered the phone. She updated me as to the problem the patient called about once before.  She states that Dr Claiborne Billings increased his humidity level up to [facility] and it has not helped him with his mouth dryness. He wakes up with his tongue stuck to the roof of his mouth.  I told her that I will have the therapist call them to see if they have any other recommendations as to what he can do to help with his mouth dryness. She agrees with this plan. Staff message sent to [facility] to have therapist to contact the patient.

## 2017-05-30 ENCOUNTER — Telehealth: Payer: Self-pay | Admitting: Cardiovascular Disease

## 2017-05-30 NOTE — Telephone Encounter (Signed)
Please call,pt says he is having problems with his C-Pap machine.

## 2017-06-01 ENCOUNTER — Ambulatory Visit: Payer: Medicare Other | Admitting: Cardiovascular Disease

## 2017-06-01 ENCOUNTER — Encounter: Payer: Self-pay | Admitting: Cardiovascular Disease

## 2017-06-01 VITALS — BP 110/68 | HR 58 | Ht 70.0 in | Wt 191.0 lb

## 2017-06-01 DIAGNOSIS — Z9989 Dependence on other enabling machines and devices: Secondary | ICD-10-CM

## 2017-06-01 DIAGNOSIS — G4733 Obstructive sleep apnea (adult) (pediatric): Secondary | ICD-10-CM | POA: Diagnosis not present

## 2017-06-01 DIAGNOSIS — Z7901 Long term (current) use of anticoagulants: Secondary | ICD-10-CM | POA: Diagnosis not present

## 2017-06-01 DIAGNOSIS — I10 Essential (primary) hypertension: Secondary | ICD-10-CM | POA: Diagnosis not present

## 2017-06-01 DIAGNOSIS — E781 Pure hyperglyceridemia: Secondary | ICD-10-CM | POA: Diagnosis not present

## 2017-06-01 DIAGNOSIS — I480 Paroxysmal atrial fibrillation: Secondary | ICD-10-CM

## 2017-06-01 NOTE — Patient Instructions (Signed)
Medication Instructions:  Your physician recommends that you continue on your current medications as directed. Please refer to the Current Medication list given to you today.  Follow-Up: Your physician wants you to follow-up in: 6 months with Dr. Claiborne Billings.  You will receive a reminder letter in the mail two months in advance. If you don't receive a letter, please call our office to schedule the follow-up appointment.   Any Other Special Instructions Will Be Listed Below (If Applicable).   Ok to hold Eliquis for 2 days prior to colonoscopy on 3/13.  (Last dose Sunday 3/10) **Check with Dr. Earlean Shawl to make sure this is okay**   If you need a refill on your cardiac medications before your next appointment, please call your pharmacy.

## 2017-06-01 NOTE — Progress Notes (Signed)
Patient ID: Mario Palmer, male   DOB: Sep 26, 1940, 77 y.o.   MRN: [id number]    Primary M.D.: Dr. Bryon Lions  HPI: Mario Palmer is a 77 y.o. male who presents for a one month follow-up evaluation.  Mario Palmer has a history of hypertension, obstructive sleep apnea on CPAP therapy, GERD, and palpitations with atrial ectopy,. 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 He has history of hyperlipidemia and has been on simvastatin and had intermittently stopped this secondary to arthralgias.  An echo Doppler study in January 2015 revealed a normal systolic function with ejection fraction of 55-60%. There was evidence for mild aortic and mitral insufficiency. His left atrium was moderately dilated.  Mario Palmer was diagnosed with obstructive sleep apnea in 2010. He had mild sleep apnea with events worse in the supine position and during REM sleep. He has been on CPAP therapy since that time and initially he was started on an 8 cm water pressure but also this was later titrated to 10 cm.  He admits to 100% compliance.  His initial machine malfunctioned in 2017 and  He received a new AirSense 10 CPAP unit.  He has noticed significant improvement in this new machine.  Compared to his old one.  He admits to 100% compliance.  He goes to bed between 9:30 9:45 AM and wakes up once around 2:30 to the bathroom and typically sleeps 9-10 hours per night.  A download was obtained from his new machine from 05/07/2015 through 06/05/2015.  This reveals 100% compliance.  He is averaging 9 hours and 53 minutes per night.  He is set at a 10 cm water pressure.  His AHI was excellent at 1.1.  He uses a fullface mask.  There was no leak.    When I saw him in March 2018 his ECG demonstrated atrial fibrillation.  He was in this of questionable duration.  At that time, I initiated anticoagulation witheliquis  5 mg twice a day.  I discontinued amlodipine and started Cardizem CD 180 mg.  He underwent an echo  Doppler study on 06/23/2016 which showed an EF of 55-60%.  He did not have wall motion abnormalities.  There was mild aortic sclerosis with mildAR and mild focal calcification of the anterior mitral valve leaflet with trivial MR.  His right ventricle is mildly dilated.  His left atrium was severely dilated and measured 64 mm parasternally.  I initiated therapy with amiodarone as an antiarrhythmic agent, with the greatest likelihood for pharmacologic success.  I decrease Bystolic to 5 mg.  He started amiodarone at 200 mg first week and ultimately titrated this to 200 mg twice a day.  He has felt improved with control his ventricular rate.  He notices that his blood pressure is slightly increased on the reduced dose of Bystolic.  He admitted to 100% compliance with CPAP therapy.  He underwent successful DC cardioversion by me on 08/18/2016 with recurrence of sinus rhythm.  He has noticed improved energy since restoration of sinus rhythm.  He is unaware of any significant ectopy but does note a rare palpitation.  He has been exercising and pedals on a stationary bike for at least 40 minutes without chest pain or shortness of breath.  When  saw him in September 2018 he was unaware of recurrent atrial fibrillation.  He does admit to occasional palpitations at night.  Apparently, he had been on Bystolic 10 mg but inadvertently when he called his primary physician Bystolic  was renewed at 5 mg instead of the 10 mg dose.  The palpitations seem to have occurred on the reduced dose.  I last saw him in December and resume bystolic at 10 mg.  This has improved his palpitations and blood pressure.  He continues to use CPAP with 100% compliance.   A download from August 21 through 12/20/2016 revealed 100% of use with an average sleep duration of 9 hours and 11 minutes.  His AHI is excellent at 1.7 on a 10 cm set pressure.  His amiodarone dose was reduced in December and he now is on 200 mg daily, in addition to Cartia 240 mg,  lisinopril 40 mg in addition to spironolactone 25 mg daily for hypertension.  He recently called the office with complaints of his tongue becoming exceedingly dry.  After several hours of CPAP use.  He has a fullface mask.  He breathes through his mouth and remotely had used a nasal mask was switched to fullface mask due to oral venting.  He had recently complained of a very dry mouth, making it difficult for him to continue to sleep.  He was worked in and seen as an add-on one month ago.  At that time, we readjusted his medication.  We had subsequent telephone conversations with additional adjustment.  He also went to his DME company and they adjusted his tube temperature to 70.  His communication setting is at 6.  He is feeling better with reference to his sleep.  His mouth is still dry.  He uses nasal saline.  He tells me he is scheduled to undergo colonoscopy on March 13 with Dr. Earlean Shawl.  He denies episodes of chest tightness.  He is unaware of recurrent atrial fibrillation.  He denies PND, orthopnea.  He presents for reevaluation.  Past Medical History:  Diagnosis Date  . GERD (gastroesophageal reflux disease)   . High cholesterol   . Hx of echocardiogram 12/2010   EF 55%, There is Stage 1 diastolic dyfunction. Normal LV filling pressure. Aortic sclerosis with mild AI, mild TR  . Hypertension   . Palpitation    with ecotopy  . Sleep apnea     Past Surgical History:  Procedure Laterality Date  . CARDIOVERSION N/A 09/12/2016   Procedure: CARDIOVERSION;  Surgeon: Troy Sine, MD;  Location: [facility] ENDOSCOPY;  Service: Cardiovascular;  Laterality: N/A;    No Known Allergies  Current Outpatient Medications  Medication Sig Dispense Refill  . amiodarone (PACERONE) 200 MG tablet 1 DAILY 60 tablet 6  . aspirin 81 MG chewable tablet Chew 81 mg by mouth daily.    Marland Kitchen CARTIA XT 240 MG 24 hr capsule take 1 capsule by mouth daily 30 capsule 8  . Cholecalciferol (VITAMIN D) 2000 UNITS tablet Take  2,000 Units by mouth daily.    Marland Kitchen ELIQUIS 5 MG TABS tablet TAKE ONE TABLET BY MOUTH TWICE DAILY  180 tablet 1  . lisinopril (PRINIVIL,ZESTRIL) 40 MG tablet Take 40 mg by mouth daily.    . nebivolol (BYSTOLIC) 10 MG tablet Take 10 mg by mouth daily.    . NON FORMULARY CPAP therapy    . Saccharomyces boulardii (PROBIOTIC) 250 MG CAPS Take 1 capsule by mouth daily.    . simvastatin (ZOCOR) 20 MG tablet Take 0.5 tablets by mouth daily.    Marland Kitchen spironolactone (ALDACTONE) 25 MG tablet Take 25 mg by mouth daily.    . Vitamin E 400 units TABS Take 1 tablet by mouth daily.  No current facility-administered medications for this visit.     Social History   Socioeconomic History  . Marital status: Married    Spouse name: Not on file  . Number of children: Not on file  . Years of education: Not on file  . Highest education level: Not on file  Social Needs  . Financial resource strain: Not on file  . Food insecurity - worry: Not on file  . Food insecurity - inability: Not on file  . Transportation needs - medical: Not on file  . Transportation needs - non-medical: Not on file  Occupational History  . Not on file  Tobacco Use  . Smoking status: Former Smoker    Types: Cigarettes  . Smokeless tobacco: Never Used  . Tobacco comment: quit smoking in 1982  Substance and Sexual Activity  . Alcohol use: No  . Drug use: No  . Sexual activity: Not on file  Other Topics Concern  . Not on file  Social History Narrative  . Not on file    No family history on file.  Social history is notable in that he is married; 2 children and 2 grandchildren. 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 There is no tobacco or alcohol use.  ROS General: Negative; No fevers, chills, or night sweats;  HEENT: Negative; No changes in vision or hearing, sinus congestion, difficulty swallowing Pulmonary: Negative; No cough, wheezing, shortness of breath, hemoptysis Cardiovascular:  Negative; No chest pain,  presyncope, syncope, GI: Negative; No nausea, vomiting, diarrhea, or abdominal pain GU: Positive for mild PSA elevation for which he was treated with an antibiotic.  Repeat laboratory will be forthcoming by Dr. Bryon Lions.  No dysuria, hematuria, or difficulty voiding Musculoskeletal: Negative; no myalgias, joint pain, or weakness Hematologic/Oncology: Negative; no easy bruising, bleeding Endocrine: Negative; no heat/cold intolerance; no diabetes Neuro: Negative; no changes in balance, headaches Skin: Negative; No rashes or skin lesions Psychiatric: Negative; No behavioral problems, depression Sleep: Positive for obstructive sleep apnea on CPAP therapy.  No snoring, daytime sleepiness, hypersomnolence, bruxism, restless legs, hypnogognic hallucinations, no cataplexy Other comprehensive 14 point system review is negative.  PE: BP 110/68   Pulse (!) 58   Ht _0  (1.778 m)   Wt 191 lb (86.6 kg)   BMI 27.41 kg/m    Repeat blood pressure by me was 118/70 supine and 120/70 standing.  Wt Readings from Last 3 Encounters:  09/22/16 192 lb (87.1 kg)  09/12/16 198 lb (89.8 kg)  08/18/16 194 lb (88 kg)   General: Alert, oriented, no distress.  Skin: normal turgor, no rashes, warm and dry HEENT: Normocephalic, atraumatic. Pupils equal round and reactive to light; sclera anicteric; extraocular muscles intact;  Nose without nasal septal hypertrophy Mouth/Parynx benign; Mallinpatti scale 3 Neck: No JVD, no carotid bruits; normal carotid upstroke Lungs: clear to ausculatation and percussion; no wheezing or rales Chest wall: without tenderness to palpitation Heart: PMI not displaced, RRR, s1 s2 normal, 1/6 systolic murmur, no diastolic murmur, no rubs, gallops, thrills, or heaves Abdomen: soft, nontender; no hepatosplenomehaly, BS+; abdominal aorta nontender and not dilated by palpation. Back: no CVA tenderness Pulses 2+ Musculoskeletal: full range of motion, normal strength, no joint  deformities Extremities: no clubbing cyanosis or edema, Homan's sign negative  Neurologic: grossly nonfocal; Cranial nerves grossly wnl Psychologic: Normal mood and affect   ECG (independently read by me):Sinus bradycardia 58 bpm.  First-degree AV block.  05/11/2017 ECG (independently read by me): Sinus bradycardia at  67 bpm.  First degree AV block with a PR interval 214 ms.  QTC normal 434 ms.  December 2018 ECG (independently read by me):  Normal sinus rhythm at 63 bpm. PR interval 250m.  QTc nterval 466 ms. No significant ST-T changes.  September 2018 ECG (independently read by me): Sinus bradycardia 52 bpm.  QTC increased at 487 ms.  No CMV ST-T change.  June 2018 ECG (independently read by me): Normal sinus rhythm at 62 bpm.  Left axis deviation.  QTc interval 479 ms.  No significant ST-T changes  May 2018 ECG (independently read by me): atrial flib  /flutter with coarse laboratory wave.  07/13/2016 ECG (independently read by me): Atrial fibrillation with a ventricular rate in the 50s.  Mild RV conduction delay.  March 2018 ECG (independently read by me): Atrial fibrillation at 63 bpm.  QTc interval 427 ms.  March 2016 ECG (independently read by me): Sinus bradycardia 59 bpm.  Mild RV conduction delay.  Normal intervals.  March 2015 ECG: Normal sinus rhythm at 60 beats per minute. No ectopy. Nonspecific T changes in lead 3.  02/04/2013 ECG: Sinus bradycardia with PACs.  LABS:  BMP Latest Ref Rng & Units 01/03/2017 09/04/2016 06/07/2016  Glucose 65 - 99 mg/dL 75 73 76  BUN 8 - 27 mg/dL _0 Creatinine 0.76 - 1.27 mg/dL 1.11 0.95 0.85  BUN/Creat Ratio 10 - 24 14 - -  Sodium 134 - 144 mmol/L 144 140 143  Potassium 3.5 - 5.2 mmol/L 4.8 3.9 4.0  Chloride 96 - 106 mmol/L 106 101 104  CO2 20 - 29 mmol/L _1 Calcium 8.6 - 10.2 mg/dL 9.8 9.3 9.8    Hepatic Function Latest Ref Rng & Units 06/07/2016 06/03/2011  Total Protein 6.1 - 8.1 g/dL 7.1 6.9  Albumin 3.6 - 5.1 g/dL 4.2  3.9  AST 10 - 35 U/L 18 30  ALT 9 - 46 U/L 18 23  Alk Phosphatase 40 - 115 U/L 54 59  Total Bilirubin 0.2 - 1.2 mg/dL 1.3(H) 0.8   CBC Latest Ref Rng & Units 09/04/2016 06/07/2016 06/03/2011  WBC 3.8 - 10.8 K/uL 7.1 6.7 -  Hemoglobin 13.2 - 17.1 g/dL 15.2 15.5 14.3  Hematocrit 38.5 - 50.0 % 45.6 46.1 42.0  Platelets 140 - 400 K/uL 241 206 -   Lab Results  Component Value Date   MCV 93.1 09/04/2016   MCV 91.8 06/07/2016   MCV 87.4 06/03/2011   Lab Results  Component Value Date   TSH 2.23 06/07/2016     BNP No results found for: PROBNP  Lipid Panel     Component Value Date/Time   CHOL 160 06/07/2016 1030   TRIG 138 06/07/2016 1030   HDL 53 06/07/2016 1030   CHOLHDL 3.0 06/07/2016 1030   VLDL 28 06/07/2016 1030   LDLCALC 79 06/07/2016 1030    RADIOLOGY: No results found.  IMPRESSION:  1. Paroxysmal atrial fibrillation (HCC)   2. OSA on CPAP   3. Essential hypertension   4. Anticoagulated   5. Pure hyperglyceridemia     ASSESSMENT AND PLAN: Mario Palmer a 77years old gentleman who has a history of hypertension, obstructive sleep apnea, palpitations with documented atrial ectopy, and hyperlipidemia.  He had developed atrial fibrillation/flutter and after titration of amiodarone, ultimately underwent successful DC cardioversion on 08/18/2016 with resolution of atrial flutter and restoration of sinus rhythm.  He has been on chronic eliquis for anticoagulatio and denies  bleeding.  His blood pressure today is stable on Cartia XT 240 mg, lisinopril 40 mg, Bystolic 10 mg, and spironolactone 25 mg daily.  He is maintaining sinus rhythm and continues to be on amiodarone 200 mg daily.  He is in need for elective colonoscopy.  I have recommended that he hold eliquis for a minimum of 48 hours, but that he should check with Dr. Earlean Shawl to see if he needs to hold this for longer duration. Marland Kitchen  He continues to use CPAP with 100% compliance.  Since I saw him.  We have undergone  several changes in his communication and he also had the tubing, temperature change to 70.  This has improved his sleep.  I again have continued to recommend saline prior to going to bed.  He does sleep with his mouth open.  Again reviewed his download, which has shown excellent compliance and his AHI was 2.7.  He is doing well from a cardiac standpoint.  He continues to be on simvastatin for hyperlipidemia.  As long as he is stable, I will cancel his appointment for April and see him in 6 months for reevaluation.  Time spent, 25 minutes. Troy Sine, MD, [facility]  06/02/2017 12:04 PM

## 2017-06-02 ENCOUNTER — Encounter: Payer: Self-pay | Admitting: Cardiovascular Disease

## 2017-06-13 ENCOUNTER — Telehealth: Payer: Self-pay

## 2017-06-13 DIAGNOSIS — Z86010 Personal history of colonic polyps: Secondary | ICD-10-CM | POA: Diagnosis not present

## 2017-06-13 DIAGNOSIS — K219 Gastro-esophageal reflux disease without esophagitis: Secondary | ICD-10-CM | POA: Diagnosis not present

## 2017-06-13 DIAGNOSIS — Z860101 Personal history of adenomatous and serrated colon polyps: Secondary | ICD-10-CM | POA: Insufficient documentation

## 2017-06-13 NOTE — Telephone Encounter (Signed)
   [facility] Pre-operative Risk Assessment    Request for surgical clearance:  1. What type of surgery is being performed? Colonoscopy & EGD   2. When is this surgery scheduled? 06/21/2017   3. What type of clearance is required (medical clearance vs. Pharmacy clearance to hold med vs. Both)? Both  4. Are there any medications that need to be held prior to surgery and how long?  Eliquis, Eliquis  5. Practice name and name of physician performing surgery? Dr. Dellis Filbert Medoff   6. What is your office phone and fax number? [phone]  Fax [phone]   7. Anesthesia type (None, local, MAC, general) ? Unknown   Leilan Bochenek T 06/13/2017, 3:44 PM  _________________________________________________________________   (provider comments below)

## 2017-06-14 DIAGNOSIS — G4733 Obstructive sleep apnea (adult) (pediatric): Secondary | ICD-10-CM | POA: Diagnosis not present

## 2017-06-15 ENCOUNTER — Telehealth: Payer: Self-pay | Admitting: *Deleted

## 2017-06-15 NOTE — Telephone Encounter (Signed)
Patient with diagnosis of  Afib on Eliquis for anticoagulation.    Procedure: Colonoscopy & EGD Date of procedure: 06/21/17  CHADS2-VASc score of  3 ( HTN, AGE x 2)  Est CrCl = 67 ml/min  Per office protocol, patient can hold Eliquis for 1 day prior to procedure.   Resume Eliquis at discretion of procedure MD   Roslynn Holte Rodriguez-Guzman PharmD, BCPS, [place] [address] [place] [address] 06/15/2017 5:16 PM

## 2017-06-15 NOTE — Telephone Encounter (Signed)
Faxed CPAP supply order to [facility] 

## 2017-06-18 ENCOUNTER — Other Ambulatory Visit: Payer: Self-pay | Admitting: Cardiovascular Disease

## 2017-06-18 NOTE — Telephone Encounter (Signed)
   Primary Cardiologist: [name], MD  Chart reviewed as part of pre-operative protocol coverage. Patient was contacted 06/18/2017 in reference to pre-operative risk assessment for pending surgery as outlined below.  Mario Palmer was last seen on 06/01/2017 by Dr. Claiborne Billings.  Since that day, Mario Palmer has done well.  Therefore, based on ACC/AHA guidelines, the patient would be at acceptable risk for the planned procedure without further cardiovascular testing.   I will route this recommendation to the requesting party via Epic fax function and remove from pre-op pool.  Please call with questions.  Per our clinical pharmacist, "patient can hold Eliquis for 1 day prior to procedure."  Almyra Deforest, PA 06/18/2017, 3:49 PM

## 2017-06-21 DIAGNOSIS — K635 Polyp of colon: Secondary | ICD-10-CM | POA: Diagnosis not present

## 2017-06-21 DIAGNOSIS — Z86010 Personal history of colonic polyps: Secondary | ICD-10-CM | POA: Diagnosis not present

## 2017-06-21 DIAGNOSIS — K2970 Gastritis, unspecified, without bleeding: Secondary | ICD-10-CM | POA: Diagnosis not present

## 2017-06-21 DIAGNOSIS — Z1211 Encounter for screening for malignant neoplasm of colon: Secondary | ICD-10-CM | POA: Diagnosis not present

## 2017-06-21 DIAGNOSIS — K5730 Diverticulosis of large intestine without perforation or abscess without bleeding: Secondary | ICD-10-CM | POA: Diagnosis not present

## 2017-06-21 DIAGNOSIS — K648 Other hemorrhoids: Secondary | ICD-10-CM | POA: Diagnosis not present

## 2017-06-21 DIAGNOSIS — D123 Benign neoplasm of transverse colon: Secondary | ICD-10-CM | POA: Diagnosis not present

## 2017-06-21 DIAGNOSIS — K219 Gastro-esophageal reflux disease without esophagitis: Secondary | ICD-10-CM | POA: Diagnosis not present

## 2017-06-21 DIAGNOSIS — D120 Benign neoplasm of cecum: Secondary | ICD-10-CM | POA: Diagnosis not present

## 2017-06-21 DIAGNOSIS — K6389 Other specified diseases of intestine: Secondary | ICD-10-CM | POA: Diagnosis not present

## 2017-06-21 DIAGNOSIS — D122 Benign neoplasm of ascending colon: Secondary | ICD-10-CM | POA: Diagnosis not present

## 2017-06-22 DIAGNOSIS — D126 Benign neoplasm of colon, unspecified: Secondary | ICD-10-CM | POA: Diagnosis not present

## 2017-06-22 DIAGNOSIS — D123 Benign neoplasm of transverse colon: Secondary | ICD-10-CM | POA: Diagnosis not present

## 2017-06-22 DIAGNOSIS — D120 Benign neoplasm of cecum: Secondary | ICD-10-CM | POA: Diagnosis not present

## 2017-06-22 DIAGNOSIS — K635 Polyp of colon: Secondary | ICD-10-CM | POA: Diagnosis not present

## 2017-06-22 DIAGNOSIS — K5289 Other specified noninfective gastroenteritis and colitis: Secondary | ICD-10-CM | POA: Diagnosis not present

## 2017-06-27 ENCOUNTER — Telehealth: Payer: Self-pay | Admitting: Cardiovascular Disease

## 2017-06-27 NOTE — Telephone Encounter (Signed)
Disregard

## 2017-06-27 NOTE — Telephone Encounter (Signed)
BP reading forward to MD for review.

## 2017-06-27 NOTE — Telephone Encounter (Signed)
As long as he is not symptomatic with his bradycardia would continue present dose.  If he starts to notice dizziness will need slight dose reduction of his beta-blocker.

## 2017-06-27 NOTE — Telephone Encounter (Signed)
New Message       Pt calling to give readings: This morning 147/87 pulse 54 94/55 91/57 pulse 40 112/69 pulse 45  119/71 pulse 46 143/78 pulse 52 126/66 pulse 51 159/83 pulse 50 145/75 pulse 46 113/68 pulse 43

## 2017-06-28 ENCOUNTER — Telehealth: Payer: Self-pay | Admitting: Cardiovascular Disease

## 2017-06-28 NOTE — Telephone Encounter (Signed)
Pt stated that when he wakes in the mornings he has some light headedness/weakness, this has only happened one time. Pt stated he just started back on his Eliquis on Saturday and that is when all of this started, pt educated that should not cause this issue.  Pt stated he would like an appt with Dr. Claiborne Billings does not want to see an extender.

## 2017-06-28 NOTE — Telephone Encounter (Signed)
Patient is having symptoms, see phone note 06/28/17. Another message sent to Dr Claiborne Billings

## 2017-06-28 NOTE — Telephone Encounter (Signed)
Spoke with patient and he wanted Dr Claiborne Billings to be aware 9 polyps removed so he did not resume Eliquis until Saturday. Readings as of yesterday...  Pt calling to give readings: Yesterday morning 147/87 pulse 54 94/55 91/57 pulse 40 112/69 pulse 45  119/71 pulse 46 143/78 pulse 52 126/66 pulse 51 159/83 pulse 50 145/75 pulse 46 113/68 pulse 43  Today blood pressure 135/68 HR 56  He has been lightheaded and dizzy with these low readings.  Will forward to Dr Claiborne Billings for review

## 2017-06-28 NOTE — Telephone Encounter (Signed)
Recommend reducing Bystolic dose in half; if heart rate continues to be below 50.  May need further dose reduction

## 2017-06-28 NOTE — Telephone Encounter (Signed)
Patient calling states that he had a colonoscopy and endoscopy last Thursday and was instructed to come off Eliquis for 48 hours prior. Patient states that he was told by his MD to stay off Eliquis for an additional 48 hours before resuming medication.

## 2017-06-29 NOTE — Telephone Encounter (Signed)
Advised patient, verbalized understanding  

## 2017-07-02 ENCOUNTER — Telehealth: Payer: Self-pay | Admitting: Cardiovascular Disease

## 2017-07-02 NOTE — Telephone Encounter (Signed)
New Message:     Pt called wanted to be seen for Dizziness and a little Shortness Of Breath.I made an appointment for him for Wednesday,please call to evaluate.

## 2017-07-02 NOTE — Telephone Encounter (Signed)
Left message to call back  appt 4/3 at 3pm with Dr. Claiborne Billings

## 2017-07-03 NOTE — Telephone Encounter (Signed)
Left message for pt of appointment date, time and location.

## 2017-07-04 ENCOUNTER — Ambulatory Visit: Payer: Medicare Other | Admitting: Cardiovascular Disease

## 2017-07-04 ENCOUNTER — Encounter: Payer: Self-pay | Admitting: Cardiovascular Disease

## 2017-07-04 VITALS — BP 104/60 | HR 49 | Ht 70.0 in | Wt 190.0 lb

## 2017-07-04 DIAGNOSIS — G4733 Obstructive sleep apnea (adult) (pediatric): Secondary | ICD-10-CM

## 2017-07-04 DIAGNOSIS — I480 Paroxysmal atrial fibrillation: Secondary | ICD-10-CM | POA: Diagnosis not present

## 2017-07-04 DIAGNOSIS — R001 Bradycardia, unspecified: Secondary | ICD-10-CM | POA: Diagnosis not present

## 2017-07-04 DIAGNOSIS — Z9989 Dependence on other enabling machines and devices: Secondary | ICD-10-CM | POA: Diagnosis not present

## 2017-07-04 DIAGNOSIS — I10 Essential (primary) hypertension: Secondary | ICD-10-CM

## 2017-07-04 DIAGNOSIS — Z7901 Long term (current) use of anticoagulants: Secondary | ICD-10-CM

## 2017-07-04 DIAGNOSIS — E7800 Pure hypercholesterolemia, unspecified: Secondary | ICD-10-CM

## 2017-07-04 MED ORDER — AMIODARONE HCL 200 MG PO TABS: 200.0000 mg | ORAL_TABLET | Freq: Every day | ORAL | 6 refills | Status: DC

## 2017-07-04 MED ORDER — DILTIAZEM HCL ER COATED BEADS 180 MG PO CP24: 180.0000 mg | ORAL_CAPSULE | Freq: Every day | ORAL | 3 refills | Status: DC

## 2017-07-04 NOTE — Patient Instructions (Signed)
Medication Instructions:  DECREASE diltiazem (Cardizem) 180 mg daily at bedtime  Follow-Up: 4 months with Dr. Claiborne Billings  Any Other Special Instructions Will Be Listed Below (If Applicable).     If you need a refill on your cardiac medications before your next appointment, please call your pharmacy.

## 2017-07-04 NOTE — Progress Notes (Addendum)
Patient ID: Mario Palmer, male   DOB: [birth year], 77 y.o.   MRN: [id number]    Primary M.D.: Dr. Bryon Lions  HPI: Mario Palmer is a 77 y.o. male who presents for a 5 week follow-up evaluation.  Mario Palmer has a history of hypertension, obstructive sleep apnea on CPAP therapy, GERD, and palpitations with atrial ectopy,. 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 He has history of hyperlipidemia and has been on simvastatin and had intermittently stopped this secondary to arthralgias.  An echo Doppler study in January 2015 revealed a normal systolic function with ejection fraction of 55-60%. There was evidence for mild aortic and mitral insufficiency. His left atrium was moderately dilated.  Mario Palmer was diagnosed with obstructive sleep apnea in 2010. He had mild sleep apnea with events worse in the supine position and during REM sleep. He has been on CPAP therapy since that time and initially he was started on an 8 cm water pressure but also this was later titrated to 10 cm.  He admits to 100% compliance.  His initial machine malfunctioned in 2017 and  He received a new AirSense 10 CPAP unit.  He has noticed significant improvement in this new machine.  Compared to his old one.  He admits to 100% compliance.  He goes to bed between 9:30 9:45 AM and wakes up once around 2:30 to the bathroom and typically sleeps 9-10 hours per night.  A download was obtained from his new machine from 05/07/2015 through 06/05/2015.  This reveals 100% compliance.  He is averaging 9 hours and 53 minutes per night.  He is set at a 10 cm water pressure.  His AHI was excellent at 1.1.  He uses a fullface mask.  There was no leak.    When I saw him in March 2018 his ECG demonstrated atrial fibrillation.  He was in this of questionable duration.  At that time, I initiated anticoagulation witheliquis  5 mg twice a day.  I discontinued amlodipine and started Cardizem CD 180 mg.  He underwent an echo  Doppler study on 06/23/2016 which showed an EF of 55-60%.  He did not have wall motion abnormalities.  There was mild aortic sclerosis with mildAR and mild focal calcification of the anterior mitral valve leaflet with trivial MR.  His right ventricle is mildly dilated.  His left atrium was severely dilated and measured 64 mm parasternally.  I initiated therapy with amiodarone as an antiarrhythmic agent, with the greatest likelihood for pharmacologic success.  I decrease Bystolic to 5 mg.  He started amiodarone at 200 mg first week and ultimately titrated this to 200 mg twice a day.  He has felt improved with control his ventricular rate.  He notices that his blood pressure is slightly increased on the reduced dose of Bystolic.  He admitted to 100% compliance with CPAP therapy.  He underwent successful DC cardioversion by me on 08/18/2016 with recurrence of sinus rhythm.  He has noticed improved energy since restoration of sinus rhythm.  He is unaware of any significant ectopy but does note a rare palpitation.  He has been exercising and pedals on a stationary bike for at least 40 minutes without chest pain or shortness of breath.  When  saw him in September 2018 he was unaware of recurrent atrial fibrillation.  He does admit to occasional palpitations at night.  Apparently, he had been on Bystolic 10 mg but inadvertently when he called his primary physician Bystolic  was renewed at 5 mg instead of the 10 mg dose.  The palpitations seem to have occurred on the reduced dose.  I last saw him in December and resume bystolic at 10 mg.  This has improved his palpitations and blood pressure.  He continues to use CPAP with 100% compliance.   A download from August 21 through 12/20/2016 revealed 100% of use with an average sleep duration of 9 hours and 11 minutes.  His AHI is excellent at 1.7 on a 10 cm set pressure.  His amiodarone dose was reduced in December and he now is on 200 mg daily, in addition to Cartia 240 mg,  lisinopril 40 mg in addition to spironolactone 25 mg daily for hypertension.  When I last saw him, he had called the office with complaints of his tongue becoming exceedingly dry.  After several hours of CPAP use.  He has a fullface mask.  He breathes through his mouth and remotely had used a nasal mask was switched to fullface mask due to oral venting.  He had complained of a very dry mouth, making it difficult for him to continue to sleep.  He was worked in and seen as an add-on one month ago.  At that time, we readjusted his medication.  We had subsequent telephone conversations with additional adjustment.  He went to his DME company and they adjusted his tube temperature to 70.    As I last saw him, he underwent colonoscopy and endoscopy by Dr. Thana Farr and was found to have 9 polyps.  Apparently the biopsy was benign.  He is continuing to have some problems with his new CPAP machine.  He has an [facility].  He has noted some heart fluttering.  At times he does note some mild dizziness he stands abruptly.  He denies chest pain.  He denies bleeding.  He is continue to use his CPAP.  He presents for evaluation.  Past Medical History:  Diagnosis Date  . GERD (gastroesophageal reflux disease)   . High cholesterol   . Hx of echocardiogram 12/2010   EF 55%, There is Stage 1 diastolic dyfunction. Normal LV filling pressure. Aortic sclerosis with mild AI, mild TR  . Hypertension   . Palpitation    with ecotopy  . Sleep apnea     Past Surgical History:  Procedure Laterality Date  . CARDIOVERSION N/A 09/12/2016   Procedure: CARDIOVERSION;  Surgeon: Troy Sine, MD;  Location: [facility] ENDOSCOPY;  Service: Cardiovascular;  Laterality: N/A;    No Known Allergies  Current Outpatient Medications  Medication Sig Dispense Refill  . amiodarone (PACERONE) 200 MG tablet Take 1 tablet (200 mg total) by mouth daily. TAKE TWO TABLETS BY  MOUTH DAILY 60 tablet 6  . aspirin 81 MG chewable tablet Chew 81 mg by mouth daily.    . Cholecalciferol (VITAMIN D) 2000 UNITS tablet Take 2,000 Units by mouth daily.    Marland Kitchen diltiazem (CARDIZEM CD) 180 MG 24 hr capsule Take 1 capsule (180 mg total) by mouth daily. 90 capsule 3  . lisinopril (PRINIVIL,ZESTRIL) 40 MG tablet Take 40 mg by mouth daily.    . nebivolol (BYSTOLIC) 10 MG tablet Take 5 mg by mouth daily. Take 1/2 tablet by mouth daily     . NON FORMULARY CPAP therapy    . ranitidine (ZANTAC) 300 MG tablet Take 300 mg by mouth at bedtime.    . Saccharomyces boulardii (PROBIOTIC)  250 MG CAPS Take 1 capsule by mouth daily.    . simvastatin (ZOCOR) 20 MG tablet Take 0.5 tablets by mouth daily.    Marland Kitchen spironolactone (ALDACTONE) 25 MG tablet Take 25 mg by mouth daily.    . Vitamin E 400 units TABS Take 1 tablet by mouth daily.    Marland Kitchen ELIQUIS 5 MG TABS tablet TAKE 1 TABLET BY MOUTH TWICE DAILY 60 tablet 0   No current facility-administered medications for this visit.     Social History   Socioeconomic History  . Marital status: Married    Spouse name: Not on file  . Number of children: Not on file  . Years of education: Not on file  . Highest education level: Not on file  Occupational History  . Not on file  Social Needs  . Financial resource strain: Not on file  . Food insecurity:    Worry: Not on file    Inability: Not on file  . Transportation needs:    Medical: Not on file    Non-medical: Not on file  Tobacco Use  . Smoking status: Former Smoker    Types: Cigarettes  . Smokeless tobacco: Never Used  . Tobacco comment: quit smoking in 1982  Substance and Sexual Activity  . Alcohol use: No  . Drug use: No  . Sexual activity: Not on file  Lifestyle  . Physical activity:    Days per week: Not on file    Minutes per session: Not on file  . Stress: Not on file  Relationships  . Social connections:    Talks on phone: Not on file    Gets together: Not on file    Attends  religious service: Not on file    Active member of club or organization: Not on file    Attends meetings of clubs or organizations: Not on file    Relationship status: Not on file  . Intimate partner violence:    Fear of current or ex partner: Not on file    Emotionally abused: Not on file    Physically abused: Not on file    Forced sexual activity: Not on file  Other Topics Concern  . Not on file  Social History Narrative  . Not on file    History reviewed. No pertinent family history.  Social history is notable in that he is married; 2 children and 2 grandchildren. 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 There is no tobacco or alcohol use.  ROS General: Negative; No fevers, chills, or night sweats;  HEENT: Negative; No changes in vision or hearing, sinus congestion, difficulty swallowing Pulmonary: Negative; No cough, wheezing, shortness of breath, hemoptysis Cardiovascular:  Negative; No chest pain, presyncope, syncope, GI: Negative; No nausea, vomiting, diarrhea, or abdominal pain GU: Positive for mild PSA elevation for which he was treated with an antibiotic.  Repeat laboratory will be forthcoming by Dr. Bryon Lions.  No dysuria, hematuria, or difficulty voiding Musculoskeletal: Negative; no myalgias, joint pain, or weakness Hematologic/Oncology: Negative; no easy bruising, bleeding Endocrine: Negative; no heat/cold intolerance; no diabetes Neuro: Negative; no changes in balance, headaches Skin: Negative; No rashes or skin lesions Psychiatric: Negative; No behavioral problems, depression Sleep: Positive for obstructive sleep apnea on CPAP therapy.  No snoring, daytime sleepiness, hypersomnolence, bruxism, restless legs, hypnogognic hallucinations, no cataplexy Other comprehensive 14 point system review is negative.  PE: BP 104/60 (BP Location: Left Arm, Patient Position: Sitting, Cuff Size: Normal)   Pulse (!) 49  Ht _0  (1.778 m)   Wt 190 lb (86.2 kg)    BMI 27.26 kg/m    Repeat blood pressure was 104/64  Wt Readings from Last 3 Encounters:  09/22/16 192 lb (87.1 kg)  09/12/16 198 lb (89.8 kg)  08/18/16 194 lb (88 kg)   General: Alert, oriented, no distress.  Skin: normal turgor, no rashes, warm and dry HEENT: Normocephalic, atraumatic. Pupils equal round and reactive to light; sclera anicteric; extraocular muscles intact; Nose without nasal septal hypertrophy Mouth/Parynx benign; Mallinpatti scale 3 Neck: No JVD, no carotid bruits; normal carotid upstroke Lungs: clear to ausculatation and percussion; no wheezing or rales Chest wall: without tenderness to palpitation Heart: PMI not displaced, RRR, s1 s2 normal, 1/6 systolic murmur, no diastolic murmur, no rubs, gallops, thrills, or heaves Abdomen: soft, nontender; no hepatosplenomehaly, BS+; abdominal aorta nontender and not dilated by palpation. Back: no CVA tenderness Pulses 2+ Musculoskeletal: full range of motion, normal strength, no joint deformities Extremities: no clubbing cyanosis or edema, Homan's sign negative  Neurologic: grossly nonfocal; Cranial nerves grossly wnl Psychologic: Normal mood and affect   ECG (independently read by me): Sinus bradycardia at 49 bpm with mild sinus arrhythmia.  First-degree AV block with a paratubal 2 to 2 ms.  No ST segment changes.  06/01/2017 ECG (independently read by me):Sinus bradycardia 58 bpm.  First-degree AV block.  05/11/2017 ECG (independently read by me): Sinus bradycardia at 67 bpm.  First degree AV block with a PR interval 214 ms.  QTC normal 434 ms.  December 2018 ECG (independently read by me):  Normal sinus rhythm at 63 bpm. PR interval 264m.  QTc nterval 466 ms. No significant ST-T changes.  September 2018 ECG (independently read by me): Sinus bradycardia 52 bpm.  QTC increased at 487 ms.  No CMV ST-T change.  June 2018 ECG (independently read by me): Normal sinus rhythm at 62 bpm.  Left axis deviation.  QTc interval 479  ms.  No significant ST-T changes  May 2018 ECG (independently read by me): atrial flib  /flutter with coarse laboratory wave.  07/13/2016 ECG (independently read by me): Atrial fibrillation with a ventricular rate in the 50s.  Mild RV conduction delay.  March 2018 ECG (independently read by me): Atrial fibrillation at 63 bpm.  QTc interval 427 ms.  March 2016 ECG (independently read by me): Sinus bradycardia 59 bpm.  Mild RV conduction delay.  Normal intervals.  March 2015 ECG: Normal sinus rhythm at 60 beats per minute. No ectopy. Nonspecific T changes in lead 3.  02/04/2013 ECG: Sinus bradycardia with PACs.  LABS:  BMP Latest Ref Rng & Units 01/03/2017 09/04/2016 06/07/2016  Glucose 65 - 99 mg/dL 75 73 76  BUN 8 - 27 mg/dL _1 Creatinine 0.76 - 1.27 mg/dL 1.11 0.95 0.85  BUN/Creat Ratio 10 - 24 14 - -  Sodium 134 - 144 mmol/L 144 140 143  Potassium 3.5 - 5.2 mmol/L 4.8 3.9 4.0  Chloride 96 - 106 mmol/L 106 101 104  CO2 20 - 29 mmol/L _2 Calcium 8.6 - 10.2 mg/dL 9.8 9.3 9.8    Hepatic Function Latest Ref Rng & Units 06/07/2016 06/03/2011  Total Protein 6.1 - 8.1 g/dL 7.1 6.9  Albumin 3.6 - 5.1 g/dL 4.2 3.9  AST 10 - 35 U/L 18 30  ALT 9 - 46 U/L 18 23  Alk Phosphatase 40 - 115 U/L 54 59  Total Bilirubin 0.2 - 1.2 mg/dL 1.3(H) 0.8  CBC Latest Ref Rng & Units 09/04/2016 06/07/2016 06/03/2011  WBC 3.8 - 10.8 K/uL 7.1 6.7 -  Hemoglobin 13.2 - 17.1 g/dL 15.2 15.5 14.3  Hematocrit 38.5 - 50.0 % 45.6 46.1 42.0  Platelets 140 - 400 K/uL 241 206 -   Lab Results  Component Value Date   MCV 93.1 09/04/2016   MCV 91.8 06/07/2016   MCV 87.4 06/03/2011   Lab Results  Component Value Date   TSH 2.23 06/07/2016     BNP No results found for: PROBNP  Lipid Panel     Component Value Date/Time   CHOL 160 06/07/2016 1030   TRIG 138 06/07/2016 1030   HDL 53 06/07/2016 1030   CHOLHDL 3.0 06/07/2016 1030   VLDL 28 06/07/2016 1030   LDLCALC 79 06/07/2016 1030     RADIOLOGY: No results found.  IMPRESSION:  1. Essential hypertension   2. Bradycardia   3. Paroxysmal atrial fibrillation (HCC)   4. OSA on CPAP   5. Anticoagulated   6. Pure hypercholesterolemia     ASSESSMENT AND PLAN: Mario Palmer is a 77 years old gentleman who has a history of hypertension, obstructive sleep apnea, palpitations with documented atrial ectopy, and hyperlipidemia.  He had developed atrial fibrillation/flutter and after titration of amiodarone, ultimately underwent successful DC cardioversion on 08/18/2016 with resolution of atrial flutter and restoration of sinus rhythm.  He has been on chronic eliquis for anticoagulatio and denies bleeding.   He had held his Eliquis for 48 hours prior to his colonoscopy which revealed 9 polyps which was successfully biopsied.  After 48 hours following his procedure he again then resumed Eliquis.  He is unaware of any recurrent atrial flutter.  He notes a rare palpitation.  His blood pressure today is low at 104/64 and he is bradycardic with a heart rate of 49.  I am recommending reducing Cartia XT from 240 mg to 180 mg and he will take this at bedtime.  He also is on a reduced dose of nebivolol at 5 mg.  He continues to be on amiodarone at 200 mg daily.  He continues to use CPAP.  He has been having some issues with his machine.  His machine is fairly new and he does not qualify for a new machine.  [facility].  He is not having any heart failure symptoms.  His last echo in March 2018 showed an EF of 55-60%.  There was mild AR.  His left atrium was severely dilated and his right atrium was moderately dilated.  He has an umbilical hernia and denies any pain or tenderness.  We will monitor his heart rate and blood pressure.  He continues to be on simvastatin for hyperlipidemia.  As long as he is stable I will see him in 4 months for reevaluation.    Time  spent: 25 minutes  Troy Sine, MD, [facility] [place]  07/06/2017 5:30 PM

## 2017-07-05 ENCOUNTER — Other Ambulatory Visit: Payer: Self-pay | Admitting: Cardiovascular Disease

## 2017-07-06 ENCOUNTER — Encounter: Payer: Self-pay | Admitting: Cardiovascular Disease

## 2017-07-09 NOTE — Addendum Note (Signed)
Addended by: Jacqulynn Cadet on: 07/09/2017 11:33 AM   Modules accepted: Orders

## 2017-07-19 ENCOUNTER — Ambulatory Visit: Payer: Medicare Other | Admitting: Cardiovascular Disease

## 2017-07-25 ENCOUNTER — Encounter: Payer: Self-pay | Admitting: Cardiovascular Disease

## 2017-07-25 DIAGNOSIS — R972 Elevated prostate specific antigen [PSA]: Secondary | ICD-10-CM | POA: Diagnosis not present

## 2017-07-25 DIAGNOSIS — I10 Essential (primary) hypertension: Secondary | ICD-10-CM | POA: Diagnosis not present

## 2017-07-25 DIAGNOSIS — E785 Hyperlipidemia, unspecified: Secondary | ICD-10-CM | POA: Diagnosis not present

## 2017-07-25 DIAGNOSIS — G4730 Sleep apnea, unspecified: Secondary | ICD-10-CM | POA: Diagnosis not present

## 2017-07-25 DIAGNOSIS — I480 Paroxysmal atrial fibrillation: Secondary | ICD-10-CM | POA: Diagnosis not present

## 2017-08-05 ENCOUNTER — Other Ambulatory Visit: Payer: Self-pay | Admitting: Cardiovascular Disease

## 2017-08-08 ENCOUNTER — Other Ambulatory Visit: Payer: Self-pay | Admitting: Cardiovascular Disease

## 2017-08-08 MED ORDER — APIXABAN 5 MG PO TABS: 5.0000 mg | ORAL_TABLET | Freq: Two times a day (BID) | ORAL | 1 refills | Status: DC

## 2017-08-08 NOTE — Telephone Encounter (Signed)
New Message    *STAT* If patient is at the pharmacy, call can be transferred to refill team.   1. Which medications need to be refilled? (please list name of each medication and dose if known) ELIQUIS 5 MG TABS tablet  2. Which pharmacy/location (including street and city if local pharmacy) is medication to be sent to? [facility]  3. Do they need a 30 day or 90 day supply? [place]

## 2017-10-18 DIAGNOSIS — G4733 Obstructive sleep apnea (adult) (pediatric): Secondary | ICD-10-CM | POA: Diagnosis not present

## 2017-10-25 DIAGNOSIS — G4733 Obstructive sleep apnea (adult) (pediatric): Secondary | ICD-10-CM | POA: Diagnosis not present

## 2017-11-12 DIAGNOSIS — Z961 Presence of intraocular lens: Secondary | ICD-10-CM | POA: Diagnosis not present

## 2017-11-12 DIAGNOSIS — H35372 Puckering of macula, left eye: Secondary | ICD-10-CM | POA: Diagnosis not present

## 2017-11-12 DIAGNOSIS — H35371 Puckering of macula, right eye: Secondary | ICD-10-CM | POA: Diagnosis not present

## 2017-11-28 ENCOUNTER — Ambulatory Visit: Payer: Medicare Other | Admitting: Cardiovascular Disease

## 2017-11-28 ENCOUNTER — Encounter: Payer: Self-pay | Admitting: Cardiovascular Disease

## 2017-11-28 VITALS — BP 141/76 | HR 51 | Ht 70.0 in | Wt 191.6 lb

## 2017-11-28 DIAGNOSIS — I480 Paroxysmal atrial fibrillation: Secondary | ICD-10-CM | POA: Diagnosis not present

## 2017-11-28 DIAGNOSIS — Z7901 Long term (current) use of anticoagulants: Secondary | ICD-10-CM | POA: Diagnosis not present

## 2017-11-28 DIAGNOSIS — I10 Essential (primary) hypertension: Secondary | ICD-10-CM | POA: Diagnosis not present

## 2017-11-28 DIAGNOSIS — G4733 Obstructive sleep apnea (adult) (pediatric): Secondary | ICD-10-CM

## 2017-11-28 DIAGNOSIS — R972 Elevated prostate specific antigen [PSA]: Secondary | ICD-10-CM

## 2017-11-28 DIAGNOSIS — Z9989 Dependence on other enabling machines and devices: Secondary | ICD-10-CM

## 2017-11-28 DIAGNOSIS — E7849 Other hyperlipidemia: Secondary | ICD-10-CM | POA: Diagnosis not present

## 2017-11-28 MED ORDER — DILTIAZEM HCL ER COATED BEADS 120 MG PO CP24: 120.0000 mg | ORAL_CAPSULE | Freq: Every day | ORAL | 3 refills | Status: DC

## 2017-11-28 MED ORDER — ATORVASTATIN CALCIUM 20 MG PO TABS: 20.0000 mg | ORAL_TABLET | Freq: Every day | ORAL | 3 refills | Status: DC

## 2017-11-28 NOTE — Progress Notes (Signed)
Patient ID: Mario Palmer, male   DOB: June 22, 1940, 77 y.o.   MRN: [id number]    Primary M.D.: Mario Palmer  HPI: Mario Palmer is a 77 y.o. male who presents for a 4 month follow-up evaluation.  Mario Palmer has a history of hypertension, obstructive sleep apnea on CPAP therapy, GERD, and palpitations with atrial ectopy,. 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 He has history of hyperlipidemia and has been on simvastatin and had intermittently stopped this secondary to arthralgias.  An echo Doppler study in January 2015 revealed a normal systolic function with ejection fraction of 55-60%. There was evidence for mild aortic and mitral insufficiency. His left atrium was moderately dilated.  Mario Palmer was diagnosed with obstructive sleep apnea in 2010. He had mild sleep apnea with events worse in the supine position and during REM sleep. He has been on CPAP therapy since that time and initially he was started on an 8 cm water pressure but also this was later titrated to 10 cm.  He admits to 100% compliance.  His initial machine malfunctioned in 2017 and  He received a new AirSense 10 CPAP unit.  He has noticed significant improvement in this new machine.  Compared to his old one.  He admits to 100% compliance.  He goes to bed between 9:30 9:45 AM and wakes up once around 2:30 to the bathroom and typically sleeps 9-10 hours per night.  A download was obtained from his new machine from 05/07/2015 through 06/05/2015.  This revealed 100% compliance.  He is averaging 9 hours and 53 minutes per night.  He is set at a 10 cm water pressure.  His AHI was excellent at 1.1.  He uses a fullface mask.  There was no leak.    When I saw him in March 2018 his ECG demonstrated atrial fibrillation.  He was in this of questionable duration.  At that time, I initiated anticoagulation witheliquis  5 mg twice a day.  I discontinued amlodipine and started Cardizem CD 180 mg.  He underwent an echo  Doppler study on 06/23/2016 which showed an EF of 55-60%.  He did not have wall motion abnormalities.  There was mild aortic sclerosis with mildAR and mild focal calcification of the anterior mitral valve leaflet with trivial MR.  His right ventricle is mildly dilated.  His left atrium was severely dilated and measured 64 mm parasternally.  I initiated therapy with amiodarone as an antiarrhythmic agent, with the greatest likelihood for pharmacologic success.  I decrease Bystolic to 5 mg.  He started amiodarone at 200 mg first week and ultimately titrated this to 200 mg twice a day.  He has felt improved with control his ventricular rate.  He notices that his blood pressure is slightly increased on the reduced dose of Bystolic.  He admitted to 100% compliance with CPAP therapy.  He underwent successful DC cardioversion by me on 08/18/2016 with recurrence of sinus rhythm.  He has noticed improved energy since restoration of sinus rhythm.  He is unaware of any significant ectopy but does note a rare palpitation.  He has been exercising and pedals on a stationary bike for at least 40 minutes without chest pain or shortness of breath.  When  saw him in September 2018 he was unaware of recurrent atrial fibrillation.  He does admit to occasional palpitations at night.  Apparently, he had been on Bystolic 10 mg but inadvertently when he called his primary physician Bystolic  was renewed at 5 mg instead of the 10 mg dose.  The palpitations seem to have occurred on the reduced dose.  I last saw him in December and resume bystolic at 10 mg.  This has improved his palpitations and blood pressure.  He continues to use CPAP with 100% compliance.   A download from August 21 through 12/20/2016 revealed 100% of use with an average sleep duration of 9 hours and 11 minutes.  His AHI is excellent at 1.7 on a 10 cm set pressure.  His amiodarone dose was reduced in December and he now is on 200 mg daily, in addition to Cartia 240 mg,  lisinopril 40 mg in addition to spironolactone 25 mg daily for hypertension.  When I last saw him, he had called the office with complaints of his tongue becoming exceedingly dry.  After several hours of CPAP use.  He has a fullface mask.  He breathes through his mouth and remotely had used a nasal mask was switched to fullface mask due to oral venting.  He had complained of a very dry mouth, making it difficult for him to continue to sleep.  He was worked in and seen as an add-on one month ago.  At that time, we readjusted his medication.  We had subsequent telephone conversations with additional adjustment.  He went to his DME company and they adjusted his tube temperature to 70.    He underwent colonoscopy and endoscopy by Mario Palmer and was found to have 9 polyps.  Apparently the biopsy was benign.  When I last saw him early April 2019 he was continuing to have difficulty with his new CPAP machine.  Subsequently he saw [facility] as his DME company.  He was given a chinstrap.  Addition, he was advised by his daughter-in-law's dentist to use Somnifix to assist his mouth breathing.  He feels since implementing this mouth taped, he has noticed marked improvement in his sleep.  He is now sleeping at least 9 hours.  His sleep is restorative.  He has noticed his blood pressure now is significantly more controlled.  He brought with him blood pressure readings from home and his systolic blood pressures have ranged from in the low to mid 90s to the 120s.  He has been unaware of any recurrent episodes of atrial fibrillation.  He denies episodes of chest pain.  He continues to be on anticoagulation for PAF.  He had follow-up laboratory in April which showed a total cholesterol 188 triglycerides 190 LDL 93 HDL 57.  PSA was increased at 6.2.  Creatinine was 1.32.  He had previously seen Mario Palmer for primary care but apparently her office is no longer taking [employer].  He will be  establishing care to see Mario Palmer.  He pesents for evaluation   Past Medical History:  Diagnosis Date  . GERD (gastroesophageal reflux disease)   . High cholesterol   . Hx of echocardiogram 12/2010   EF 55%, There is Stage 1 diastolic dyfunction. Normal LV filling pressure. Aortic sclerosis with mild AI, mild TR  . Hypertension   . Palpitation    with ecotopy  . Sleep apnea     Past Surgical History:  Procedure Laterality Date  . CARDIOVERSION N/A 09/12/2016   Procedure: CARDIOVERSION;  Surgeon: Troy Sine, MD;  Location: [facility] ENDOSCOPY;  Service: Cardiovascular;  Laterality: N/A;    No Known Allergies  Current Outpatient Medications  Medication Sig Dispense Refill  .  amiodarone (PACERONE) 200 MG tablet Take 200 mg by mouth daily.    Marland Kitchen apixaban (ELIQUIS) 5 MG TABS tablet Take 1 tablet (5 mg total) by mouth 2 (two) times daily. 180 tablet 1  . aspirin 81 MG chewable tablet Chew 81 mg by mouth daily.    . Cholecalciferol (VITAMIN D) 2000 UNITS tablet Take 2,000 Units by mouth daily.    Marland Kitchen diltiazem (CARDIZEM CD) 120 MG 24 hr capsule Take 1 capsule (120 mg total) by mouth at bedtime. 90 capsule 3  . lisinopril (PRINIVIL,ZESTRIL) 40 MG tablet Take 40 mg by mouth daily.    . nebivolol (BYSTOLIC) 10 MG tablet Take 5 mg by mouth daily. Take 1/2 tablet by mouth daily     . NON FORMULARY CPAP therapy    . Saccharomyces boulardii (PROBIOTIC) 250 MG CAPS Take 1 capsule by mouth daily.    Marland Kitchen spironolactone (ALDACTONE) 25 MG tablet Take 25 mg by mouth daily.    . Vitamin E 400 units TABS Take 1 tablet by mouth daily.    Marland Kitchen atorvastatin (LIPITOR) 20 MG tablet Take 1 tablet (20 mg total) by mouth daily. 90 tablet 3   No current facility-administered medications for this visit.     Social History   Socioeconomic History  . Marital status: Married    Spouse name: Not on file  . Number of children: Not on file  . Years of education: Not on file  . Highest education level: Not  on file  Occupational History  . Not on file  Social Needs  . Financial resource strain: Not on file  . Food insecurity:    Worry: Not on file    Inability: Not on file  . Transportation needs:    Medical: Not on file    Non-medical: Not on file  Tobacco Use  . Smoking status: Former Smoker    Types: Cigarettes  . Smokeless tobacco: Never Used  . Tobacco comment: quit smoking in 1982  Substance and Sexual Activity  . Alcohol use: No  . Drug use: No  . Sexual activity: Not on file  Lifestyle  . Physical activity:    Days per week: Not on file    Minutes per session: Not on file  . Stress: Not on file  Relationships  . Social connections:    Talks on phone: Not on file    Gets together: Not on file    Attends religious service: Not on file    Active member of club or organization: Not on file    Attends meetings of clubs or organizations: Not on file    Relationship status: Not on file  . Intimate partner violence:    Fear of current or ex partner: Not on file    Emotionally abused: Not on file    Physically abused: Not on file    Forced sexual activity: Not on file  Other Topics Concern  . Not on file  Social History Narrative  . Not on file    No family history on file.  Social history is notable in that he is married; 2 children and 2 grandchildren. 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 There is no tobacco or alcohol use.  ROS General: Negative; No fevers, chills, or night sweats;  HEENT: Negative; No changes in vision or hearing, sinus congestion, difficulty swallowing Pulmonary: Negative; No cough, wheezing, shortness of breath, hemoptysis Cardiovascular:  Negative; No chest pain, presyncope, syncope, GI: Negative; No nausea,  vomiting, diarrhea, or abdominal pain GU: Positive for mild PSA elevation for which he was treated with an antibiotic.  Repeat laboratory will be forthcoming by Mario Palmer.  No dysuria, hematuria, or difficulty  voiding Musculoskeletal: Negative; no myalgias, joint pain, or weakness Hematologic/Oncology: Negative; no easy bruising, bleeding Endocrine: Negative; no heat/cold intolerance; no diabetes Neuro: Negative; no changes in balance, headaches Skin: Negative; No rashes or skin lesions Psychiatric: Negative; No behavioral problems, depression Sleep: Positive for obstructive sleep apnea on CPAP therapy.  No snoring, daytime sleepiness, hypersomnolence, bruxism, restless legs, hypnogognic hallucinations, no cataplexy Other comprehensive 14 point system review is negative.  PE: BP (!) 141/76   Pulse (!) 51   Ht _0  (1.778 m)   Wt 191 lb 9.6 oz (86.9 kg)   BMI 27.49 kg/m    Repeat blood pressure by me was 126/64.  Wt Readings from Last 3 Encounters:  09/22/16 192 lb (87.1 kg)  09/12/16 198 lb (89.8 kg)  08/18/16 194 lb (88 kg)   General: Alert, oriented, no distress.  Skin: normal turgor, no rashes, warm and dry HEENT: Normocephalic, atraumatic. Pupils equal round and reactive to light; sclera anicteric; extraocular muscles intact;  Nose without nasal septal hypertrophy Mouth/Parynx benign; Mallinpatti scale 3 Neck: No JVD, no carotid bruits; normal carotid upstroke Lungs: clear to ausculatation and percussion; no wheezing or rales Chest wall: without tenderness to palpitation Heart: PMI not displaced, RRR, s1 s2 normal, 1/6 systolic murmur, no diastolic murmur, no rubs, gallops, thrills, or heaves Abdomen: soft, nontender; no hepatosplenomehaly, BS+; abdominal aorta nontender and not dilated by palpation. Back: no CVA tenderness Pulses 2+ Musculoskeletal: full range of motion, normal strength, no joint deformities Extremities: no clubbing cyanosis or edema, Homan's sign negative  Neurologic: grossly nonfocal; Cranial nerves grossly wnl Psychologic: Normal mood and affect    ECG (independently read by me): Sinus Bradycardia at 51, degree AV block.  No ectopy.  April 2019 ECG  (independently read by me): Sinus bradycardia at 49 bpm with mild sinus arrhythmia.  First-degree AV block with a paratubal 2 to 2 ms.  No ST segment changes.  06/01/2017 ECG (independently read by me):Sinus bradycardia 58 bpm.  First-degree AV block.  05/11/2017 ECG (independently read by me): Sinus bradycardia at 67 bpm.  First degree AV block with a PR interval 214 ms.  QTC normal 434 ms.  December 2018 ECG (independently read by me):  Normal sinus rhythm at 63 bpm. PR interval 26m.  QTc nterval 466 ms. No significant ST-T changes.  September 2018 ECG (independently read by me): Sinus bradycardia 52 bpm.  QTC increased at 487 ms.  No CMV ST-T change.  June 2018 ECG (independently read by me): Normal sinus rhythm at 62 bpm.  Left axis deviation.  QTc interval 479 ms.  No significant ST-T changes  May 2018 ECG (independently read by me): atrial flib  /flutter with coarse laboratory wave.  07/13/2016 ECG (independently read by me): Atrial fibrillation with a ventricular rate in the 50s.  Mild RV conduction delay.  March 2018 ECG (independently read by me): Atrial fibrillation at 63 bpm.  QTc interval 427 ms.  March 2016 ECG (independently read by me): Sinus bradycardia 59 bpm.  Mild RV conduction delay.  Normal intervals.  March 2015 ECG: Normal sinus rhythm at 60 beats per minute. No ectopy. Nonspecific T changes in lead 3.  02/04/2013 ECG: Sinus bradycardia with PACs.  In the office today I obtained a new download from October 29, 2017 through November 27, 2017.  During this time he has been using some to fix on a daily basis.  Compliance is 100%.  He is averaging 9 hours and 19 minutes of sleep per night.  At a 10 cm set pressure, AHI is excellent at 1.2.  There is no leak.  LABS:  BMP Latest Ref Rng & Units 01/03/2017 09/04/2016 06/07/2016  Glucose 65 - 99 mg/dL 75 73 76  BUN 8 - 27 mg/dL _0 Creatinine 0.76 - 1.27 mg/dL 1.11 0.95 0.85  BUN/Creat Ratio 10 - 24 14 - -  Sodium 134 -  144 mmol/L 144 140 143  Potassium 3.5 - 5.2 mmol/L 4.8 3.9 4.0  Chloride 96 - 106 mmol/L 106 101 104  CO2 20 - 29 mmol/L _1 Calcium 8.6 - 10.2 mg/dL 9.8 9.3 9.8    Hepatic Function Latest Ref Rng & Units 06/07/2016 06/03/2011  Total Protein 6.1 - 8.1 g/dL 7.1 6.9  Albumin 3.6 - 5.1 g/dL 4.2 3.9  AST 10 - 35 U/L 18 30  ALT 9 - 46 U/L 18 23  Alk Phosphatase 40 - 115 U/L 54 59  Total Bilirubin 0.2 - 1.2 mg/dL 1.3(H) 0.8   CBC Latest Ref Rng & Units 09/04/2016 06/07/2016 06/03/2011  WBC 3.8 - 10.8 K/uL 7.1 6.7 -  Hemoglobin 13.2 - 17.1 g/dL 15.2 15.5 14.3  Hematocrit 38.5 - 50.0 % 45.6 46.1 42.0  Platelets 140 - 400 K/uL 241 206 -   Lab Results  Component Value Date   MCV 93.1 09/04/2016   MCV 91.8 06/07/2016   MCV 87.4 06/03/2011   Lab Results  Component Value Date   TSH 2.23 06/07/2016     BNP No results found for: PROBNP  Lipid Panel     Component Value Date/Time   CHOL 160 06/07/2016 1030   TRIG 138 06/07/2016 1030   HDL 53 06/07/2016 1030   CHOLHDL 3.0 06/07/2016 1030   VLDL 28 06/07/2016 1030   LDLCALC 79 06/07/2016 1030    RADIOLOGY: No results found.  IMPRESSION:  1. Essential hypertension   2. Paroxysmal atrial fibrillation (HCC)   3. OSA on CPAP   4. Anticoagulated   5. Other hyperlipidemia   6. PSA elevation     ASSESSMENT AND PLAN: Mario Palmer is a 77 years old gentleman who has a history of hypertension, obstructive sleep apnea, palpitations with documented atrial ectopy, and hyperlipidemia.  He had developed atrial fibrillation/flutter and after titration of amiodarone, ultimately underwent successful DC cardioversion on 08/18/2016 with resolution of atrial flutter and restoration of sinus rhythm.  He has been on chronic eliquis for anticoagulatio and denies bleeding.   He had held his Eliquis for 48 hours prior to his colonoscopy which revealed 9 polyps which was successfully biopsied.  After 48 hours following his procedure he again then  resumed Eliquis.  His blood pressure today is stable but I reviewed his most recent blood pressure recordings for the past month which he had taken at home.  During this time, he has been using Somnifix and is not typically used his chinstrap.  This is completely resolved his prior oral venting.  He is 100% with compliant with CPAP therapy and his AHI is excellent.  With improved sleep, blood pressure is improved and at times has been on the low side.  I have recommended reduction of his diltiazem dose from 180 mg to 120 mg at bedtime.  He has not  had any further atrial fibrillation and continues to be on amiodarone 20 mg daily in addition to Bystolic 5 mg.  He also is on lisinopril 40 mg for his blood pressure in addition to spironolactone.  There is no edema.  He has been on low-dose simvastatin particularly with the interaction with Cardizem.  With his LDL of 93 I have suggested he discontinue simvastatin and will start him on moderate dose atorvastatin at 20 mg daily.  I discussed with him improve diet with reference to reduction in sweets and carbohydrates.  He will be establishing care with Mario Palmer who most likely will repeat laboratory on his initial evaluation.  Recent laboratory has shown an elevation of PSA.  I have suggested  urologic evaluationif this has not already been done.. As long as he remains stable I will see him in 6 months for follow-up cardiology assessment.   Time spent: 25 minutes  Troy Sine, MD, [facility]  11/28/2017 9:43 AM

## 2017-11-28 NOTE — Patient Instructions (Signed)
Medication Instructions:  DECREASE diltiazem to 120 mg at bedtime STOP simvastatin START atorvastatin (Lipitor) 20 mg daily  Follow-Up: Your physician wants you to follow-up in: 6 months with Dr. Claiborne Billings. You will receive a reminder letter in the mail two months in advance. If you don't receive a letter, please call our office to schedule the follow-up appointment.   Any Other Special Instructions Will Be Listed Below (If Applicable).     If you need a refill on your cardiac medications before your next appointment, please call your pharmacy.

## 2017-12-05 NOTE — Addendum Note (Signed)
Addended by: Ulice Brilliant T on: 12/05/2017 02:04 PM   Modules accepted: Orders

## 2017-12-07 ENCOUNTER — Other Ambulatory Visit: Payer: Self-pay | Admitting: Cardiovascular Disease

## 2018-01-28 DIAGNOSIS — G4733 Obstructive sleep apnea (adult) (pediatric): Secondary | ICD-10-CM | POA: Diagnosis not present

## 2018-02-06 ENCOUNTER — Ambulatory Visit: Payer: Self-pay

## 2018-02-06 ENCOUNTER — Ambulatory Visit: Payer: Self-pay | Admitting: Nurse Practitioner

## 2018-03-03 ENCOUNTER — Other Ambulatory Visit: Payer: Self-pay | Admitting: Nurse Practitioner

## 2018-03-15 ENCOUNTER — Ambulatory Visit (INDEPENDENT_AMBULATORY_CARE_PROVIDER_SITE_OTHER): Payer: Medicare Other | Admitting: Nurse Practitioner

## 2018-03-15 ENCOUNTER — Encounter: Payer: Self-pay | Admitting: Nurse Practitioner

## 2018-03-15 VITALS — BP 120/70 | HR 48 | Temp 97.6°F | Ht 70.0 in | Wt 195.6 lb

## 2018-03-15 DIAGNOSIS — R972 Elevated prostate specific antigen [PSA]: Secondary | ICD-10-CM | POA: Diagnosis not present

## 2018-03-15 DIAGNOSIS — R21 Rash and other nonspecific skin eruption: Secondary | ICD-10-CM | POA: Diagnosis not present

## 2018-03-15 DIAGNOSIS — G4733 Obstructive sleep apnea (adult) (pediatric): Secondary | ICD-10-CM | POA: Diagnosis not present

## 2018-03-15 DIAGNOSIS — E782 Mixed hyperlipidemia: Secondary | ICD-10-CM | POA: Diagnosis not present

## 2018-03-15 DIAGNOSIS — Z9989 Dependence on other enabling machines and devices: Secondary | ICD-10-CM

## 2018-03-15 DIAGNOSIS — I10 Essential (primary) hypertension: Secondary | ICD-10-CM

## 2018-03-15 LAB — POCT URINALYSIS DIPSTICK
Bilirubin, UA: NEGATIVE
Glucose, UA: NEGATIVE
Ketones, UA: NEGATIVE
LEUKOCYTES UA: NEGATIVE
Nitrite, UA: NEGATIVE
PH UA: 6 (ref 5.0–8.0)
Protein, UA: NEGATIVE
RBC UA: NEGATIVE
Spec Grav, UA: 1.015 (ref 1.010–1.025)
UROBILINOGEN UA: 0.2 U/dL

## 2018-03-15 LAB — POCT UA - MICROALBUMIN
Albumin/Creatinine Ratio, Urine, POC: 30
CREATININE, POC: 100 mg/dL
MICROALBUMIN (UR) POC: 10 mg/L

## 2018-03-15 NOTE — Addendum Note (Signed)
Addended by: Electa Sniff on: 03/15/2018 12:15 PM   Modules accepted: Orders

## 2018-03-15 NOTE — Addendum Note (Signed)
Addended by: Electa Sniff on: 03/15/2018 11:21 AM   Modules accepted: Orders

## 2018-03-15 NOTE — Progress Notes (Signed)
Subjective:     Patient ID: Mario Palmer , male    DOB: 05-Mar-1941 , 77 y.o.   MRN: [id number]   Chief Complaint  Patient presents with  . Hypertension  . Hyperlipidemia    HPI  Hypertension  This is a chronic problem. The current episode started more than 1 year ago. The problem is unchanged. The problem is controlled. Pertinent negatives include no anxiety, chest pain or palpitations. There are no associated agents to hypertension. There are no known risk factors for coronary artery disease. Past treatments include diuretics and beta blockers. The current treatment provides significant improvement. There are no compliance problems.  There is no history of angina. There is no history of chronic renal disease.  Hyperlipidemia  This is a chronic problem. The current episode started more than 1 year ago. The problem is controlled. Recent lipid tests were reviewed and are high (triglycerides were 190). He has no history of chronic renal disease. Factors aggravating his hyperlipidemia include beta blockers. Pertinent negatives include no chest pain. Current antihyperlipidemic treatment includes statins. The current treatment provides no improvement of lipids. There are no compliance problems.  Risk factors for coronary artery disease include hypertension, male sex and dyslipidemia.  Rash  This is a chronic problem. The current episode started more than 1 year ago. The problem has been waxing and waning since onset. The affected locations include the torso. The rash is characterized by itchiness (scabbed). Past treatments include antibiotic cream. The treatment provided no relief. There is no history of allergies or asthma.     Past Medical History:  Diagnosis Date  . GERD (gastroesophageal reflux disease)   . High cholesterol   . Hx of echocardiogram 12/2010   EF 55%, There is Stage 1 diastolic dyfunction. Normal LV filling pressure. Aortic sclerosis with mild AI, mild TR  . Hypertension    . Palpitation    with ecotopy  . Sleep apnea      History reviewed. No pertinent family history.   Current Outpatient Medications:  .  amiodarone (PACERONE) 200 MG tablet, Take 200 mg by mouth daily., Disp: , Rfl:  .  apixaban (ELIQUIS) 5 MG TABS tablet, Take 1 tablet (5 mg total) by mouth 2 (two) times daily., Disp: 180 tablet, Rfl: 1 .  aspirin 81 MG chewable tablet, Chew 81 mg by mouth daily., Disp: , Rfl:  .  Cholecalciferol (VITAMIN D) 2000 UNITS tablet, Take 2,000 Units by mouth daily., Disp: , Rfl:  .  diltiazem (CARDIZEM CD) 120 MG 24 hr capsule, Take 1 capsule (120 mg total) by mouth at bedtime., Disp: 90 capsule, Rfl: 3 .  lisinopril (PRINIVIL,ZESTRIL) 40 MG tablet, TAKE ONE TABLET BY MOUTH ONE TIME DAILY , Disp: 30 tablet, Rfl: 0 .  nebivolol (BYSTOLIC) 10 MG tablet, Take 5 mg by mouth daily. Take 1/2 tablet by mouth daily , Disp: , Rfl:  .  NON FORMULARY, CPAP therapy, Disp: , Rfl:  .  Saccharomyces boulardii (PROBIOTIC) 250 MG CAPS, Take 1 capsule by mouth daily., Disp: , Rfl:  .  spironolactone (ALDACTONE) 25 MG tablet, take 1 tablet by mouth daily, Disp: 90 tablet, Rfl: 1 .  Vitamin E 400 units TABS, Take 1 tablet by mouth daily., Disp: , Rfl:  .  atorvastatin (LIPITOR) 20 MG tablet, Take 1 tablet (20 mg total) by mouth daily., Disp: 90 tablet, Rfl: 3   No Known Allergies   Review of Systems  Constitutional: Negative.   Respiratory: Negative.  Cardiovascular: Negative for chest pain, palpitations and leg swelling.  Gastrointestinal: Negative.   Musculoskeletal: Negative.   Skin: Positive for rash. Negative for color change, pallor and wound.  Hematological: Bruises/bleeds easily.     Today's Vitals   03/15/18 0838  BP: 120/70  Pulse: (!) 48  Temp: 97.6 F (36.4 C)  TempSrc: Oral  SpO2: 95%  Weight: 195 lb 9.6 oz (88.7 kg)  Height: 5\' 10"  (1.778 m)   Body mass index is 28.07 kg/m.   Objective:  Physical Exam Vitals signs reviewed.  Constitutional:       Appearance: Normal appearance.  Cardiovascular:     Rate and Rhythm: Normal rate.  Pulmonary:     Effort: Pulmonary effort is normal.     Breath sounds: Normal breath sounds. No wheezing.  Chest:     Chest wall: No tenderness.  Musculoskeletal: Normal range of motion.  Skin:    General: Skin is warm.     Capillary Refill: Capillary refill takes less than 2 seconds.     Findings: Bruising (left posterior hand) and lesion (left darkened lesion underneath breast) present.  Neurological:     General: No focal deficit present.     Mental Status: He is alert.         Assessment And Plan:     1. Essential hypertension B/P is controlled.   CMP ordered to check renal function.  - CMP14 + Anion Gap  2. OSA on CPAP  Chronic  Doing well with CPAP  3. Mixed hyperlipidemia Chronic, controlled Continue with current medications - Lipid panel - CMP14 + Anion Gap  4. Elevated PSA  Chronic  I have referred him to Urology three times and he has not gone yet, I have stressed to him the elevated PSA can indicate prostate cancer.  I will check again today and have advised him if still elevated he will need to see a urologist for further evaluation - PSA  5. Skin eruption  Lesion to underneath left breast will refer to dermatology    [name], FNP

## 2018-03-16 LAB — CMP14 + ANION GAP
A/G RATIO: 1.6 (ref 1.2–2.2)
ALK PHOS: 62 IU/L (ref 39–117)
ALT: 26 IU/L (ref 0–44)
AST: 20 IU/L (ref 0–40)
Albumin: 4.3 g/dL (ref 3.5–4.8)
Anion Gap: 15 mmol/L (ref 10.0–18.0)
BILIRUBIN TOTAL: 0.8 mg/dL (ref 0.0–1.2)
BUN/Creatinine Ratio: 13 (ref 10–24)
BUN: 19 mg/dL (ref 8–27)
CHLORIDE: 102 mmol/L (ref 96–106)
CO2: 25 mmol/L (ref 20–29)
Calcium: 9.9 mg/dL (ref 8.6–10.2)
Creatinine, Ser: 1.49 mg/dL — ABNORMAL HIGH (ref 0.76–1.27)
GFR calc Af Amer: 51 mL/min/{1.73_m2} — ABNORMAL LOW (ref >59)
GFR calc non Af Amer: 44 mL/min/{1.73_m2} — ABNORMAL LOW (ref >59)
GLOBULIN, TOTAL: 2.7 g/dL (ref 1.5–4.5)
GLUCOSE: 78 mg/dL (ref 65–99)
POTASSIUM: 5.3 mmol/L — AB (ref 3.5–5.2)
SODIUM: 142 mmol/L (ref 134–144)
Total Protein: 7 g/dL (ref 6.0–8.5)

## 2018-03-16 LAB — LIPID PANEL
CHOL/HDL RATIO: 2.8 ratio (ref 0.0–5.0)
Cholesterol, Total: 178 mg/dL (ref 100–199)
HDL: 63 mg/dL (ref >39)
LDL CALC: 89 mg/dL (ref 0–99)
TRIGLYCERIDES: 128 mg/dL (ref 0–149)
VLDL Cholesterol Cal: 26 mg/dL (ref 5–40)

## 2018-03-16 LAB — PSA: PROSTATE SPECIFIC AG, SERUM: 7.6 ng/mL — AB (ref 0.0–4.0)

## 2018-03-18 ENCOUNTER — Telehealth: Payer: Self-pay | Admitting: Cardiovascular Disease

## 2018-03-18 DIAGNOSIS — H35371 Puckering of macula, right eye: Secondary | ICD-10-CM | POA: Diagnosis not present

## 2018-03-18 DIAGNOSIS — Z961 Presence of intraocular lens: Secondary | ICD-10-CM | POA: Diagnosis not present

## 2018-03-18 DIAGNOSIS — H35372 Puckering of macula, left eye: Secondary | ICD-10-CM | POA: Diagnosis not present

## 2018-03-18 DIAGNOSIS — G4733 Obstructive sleep apnea (adult) (pediatric): Secondary | ICD-10-CM | POA: Diagnosis not present

## 2018-03-18 NOTE — Telephone Encounter (Signed)
Patient with diagnosis of afib on apixaban for anticoagulation.    Procedure: Vitrectomy membrane pell to right eye  Date of procedure: 04/24/18  CHADS2-VASc score of  3 (CHF, HTN, AGE, DM2, stroke/tia x 2, CAD, AGE, male)  CrCl 33ml/min  Per office protocol, patient can hold apixaban for 2 days prior to procedure.

## 2018-03-18 NOTE — Telephone Encounter (Signed)
New message        [facility] Pre-operative Risk Assessment    Request for surgical clearance:  1. What type of surgery is being performed? 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 When is this surgery scheduled? 04/24/2018   3. What type of clearance is required (medical clearance vs. Pharmacy clearance to hold med vs. Both)? Both   4. Are there any medications that need to be held prior to surgery and how long?Need to stop eliquis 2 days before surgery   5. Practice name and name of physician performing surgery? [facility], Dr. Karin Lieu Rankin  6. What is your office phone number [phone]   7.   What is your office fax number [phone]  8.   Anesthesia type (None, local, MAC, general) ? Local MAC   Mario Palmer 03/18/2018, 1:52 PM  _________________________________________________________________   (provider comments below)

## 2018-03-18 NOTE — Telephone Encounter (Signed)
Pharm please address Eliquis 

## 2018-03-25 ENCOUNTER — Telehealth: Payer: Self-pay

## 2018-03-25 NOTE — Telephone Encounter (Signed)
Patient called asking how many mg of potassium should he be taking?  returned pt call and advised him that his potassium level is elevated and he needs to take some miralax and metamucil 1 teaspoon in a glass of water 4-8ounces and he is to come in a week for a recheck per [name] FNP-BC. Patient declined to schedule an appointment at this time he said he would like to get through the holidays first and then he will call us for an appointment for labs. YRL,RMA

## 2018-04-03 HISTORY — PX: RETINAL LASER PROCEDURE: SHX2339

## 2018-04-09 ENCOUNTER — Other Ambulatory Visit: Payer: Self-pay | Admitting: Nurse Practitioner

## 2018-04-22 DIAGNOSIS — G4733 Obstructive sleep apnea (adult) (pediatric): Secondary | ICD-10-CM | POA: Diagnosis not present

## 2018-04-24 DIAGNOSIS — H35371 Puckering of macula, right eye: Secondary | ICD-10-CM | POA: Diagnosis not present

## 2018-04-29 ENCOUNTER — Other Ambulatory Visit: Payer: Self-pay | Admitting: Nurse Practitioner

## 2018-04-29 DIAGNOSIS — G4733 Obstructive sleep apnea (adult) (pediatric): Secondary | ICD-10-CM | POA: Diagnosis not present

## 2018-05-02 DIAGNOSIS — G4733 Obstructive sleep apnea (adult) (pediatric): Secondary | ICD-10-CM | POA: Diagnosis not present

## 2018-05-06 DIAGNOSIS — H35371 Puckering of macula, right eye: Secondary | ICD-10-CM | POA: Diagnosis not present

## 2018-05-20 ENCOUNTER — Encounter: Payer: Self-pay | Admitting: Nurse Practitioner

## 2018-05-20 ENCOUNTER — Ambulatory Visit (INDEPENDENT_AMBULATORY_CARE_PROVIDER_SITE_OTHER): Payer: Medicare Other | Admitting: Nurse Practitioner

## 2018-05-20 VITALS — BP 112/68 | HR 59 | Temp 98.0°F | Ht 70.0 in | Wt 201.8 lb

## 2018-05-20 DIAGNOSIS — J3089 Other allergic rhinitis: Secondary | ICD-10-CM | POA: Diagnosis not present

## 2018-05-20 DIAGNOSIS — H9193 Unspecified hearing loss, bilateral: Secondary | ICD-10-CM | POA: Diagnosis not present

## 2018-05-20 DIAGNOSIS — R21 Rash and other nonspecific skin eruption: Secondary | ICD-10-CM

## 2018-05-20 DIAGNOSIS — E875 Hyperkalemia: Secondary | ICD-10-CM

## 2018-05-20 DIAGNOSIS — R972 Elevated prostate specific antigen [PSA]: Secondary | ICD-10-CM

## 2018-05-20 DIAGNOSIS — H6123 Impacted cerumen, bilateral: Secondary | ICD-10-CM | POA: Diagnosis not present

## 2018-05-20 MED ORDER — LORATADINE 10 MG PO TABS: 10.0000 mg | ORAL_TABLET | Freq: Every day | ORAL | 2 refills | Status: DC

## 2018-05-20 NOTE — Addendum Note (Signed)
Addended by: [name] F on: 05/20/2018 01:59 PM   Modules accepted: Orders

## 2018-05-20 NOTE — Progress Notes (Signed)
Subjective:     Patient ID: JERMAIN CURT , male    DOB: [birth year] , 78 y.o.   MRN: [id number]   Chief Complaint  Patient presents with  . URI    patient states he has been having sinus pressure and pain for the last couple of weeks. patient now report having ear pain he stated it sounds like he is talking through a barrell. patient denies having  a fever. patient stated he had a cough and some chest tightness that has now subsided.    HPI  No other family members are sick in the household.    Hearing loss - since being congested he feels like he is talking through a barrel.    URI   This is a new problem. The current episode started 1 to 4 weeks ago. The problem has been unchanged. There has been no fever. Associated symptoms include a plugged ear sensation and sinus pain. Pertinent negatives include no chest pain, congestion, coughing, ear pain (having difficulty with hearing), headaches, joint swelling, rhinorrhea, sneezing, swollen glands or wheezing. Treatments tried: took HBP Cor[place]. The treatment provided mild relief.     Past Medical History:  Diagnosis Date  . GERD (gastroesophageal reflux disease)   . High cholesterol   . Hx of echocardiogram 12/2010   EF 55%, There is Stage 1 diastolic dyfunction. Normal LV filling pressure. Aortic sclerosis with mild AI, mild TR  . Hypertension   . Palpitation    with ecotopy  . Sleep apnea      No family history on file.   Current Outpatient Medications:  .  amiodarone (PACERONE) 200 MG tablet, Take 200 mg by mouth daily., Disp: , Rfl:  .  apixaban (ELIQUIS) 5 MG TABS tablet, Take 1 tablet (5 mg total) by mouth 2 (two) times daily., Disp: 180 tablet, Rfl: 1 .  aspirin 81 MG chewable tablet, Chew 81 mg by mouth daily., Disp: , Rfl:  .  atorvastatin (LIPITOR) 20 MG tablet, Take 1 tablet (20 mg total) by mouth daily., Disp: 90 tablet, Rfl: 3 .  BYSTOLIC 10 MG tablet, TAKE ONE TABLET BY MOUTH ONE TIME DAILY , Disp: 90  tablet, Rfl: 1 .  Cholecalciferol (VITAMIN D) 2000 UNITS tablet, Take 2,000 Units by mouth daily., Disp: , Rfl:  .  diltiazem (CARDIZEM CD) 120 MG 24 hr capsule, Take 1 capsule (120 mg total) by mouth at bedtime., Disp: 90 capsule, Rfl: 3 .  lisinopril (PRINIVIL,ZESTRIL) 40 MG tablet, TAKE ONE TABLET BY MOUTH ONE TIME DAILY *PATIENT NEEDS AN APPOINTMENT*, Disp: 90 tablet, Rfl: 0 .  NON FORMULARY, CPAP therapy, Disp: , Rfl:  .  Saccharomyces boulardii (PROBIOTIC) 250 MG CAPS, Take 1 capsule by mouth daily., Disp: , Rfl:  .  spironolactone (ALDACTONE) 25 MG tablet, take 1 tablet by mouth daily, Disp: 90 tablet, Rfl: 1 .  Vitamin E 400 units TABS, Take 1 tablet by mouth daily., Disp: , Rfl:    No Known Allergies   Review of Systems  HENT: Positive for sinus pain. Negative for congestion, ear pain (having difficulty with hearing), rhinorrhea and sneezing.   Eyes: Negative for photophobia.  Respiratory: Negative for cough and wheezing.   Cardiovascular: Negative.  Negative for chest pain, palpitations and leg swelling.  Endocrine: Negative for polydipsia and polyphagia.  Skin: Positive for color change.       Lesion on his left lateral anterior thoracic/abdominal area is worsening.  Reports having a scab then will come off  every few weeks.  Will drain small amount of secretions and cause the dressing to be dry.  Neurological: Negative for dizziness and headaches.     Today's Vitals   05/20/18 1200  BP: 112/68  Pulse: (!) 59  Temp: 98 F (36.7 C)  TempSrc: Oral  Weight: 201 lb 12.8 oz (91.5 kg)  Height: _0  (1.778 m)  PainSc: 0-No pain   Body mass index is 28.96 kg/m.   Objective:  Physical Exam Constitutional:      Appearance: Normal appearance.  HENT:     Right Ear: Tympanic membrane normal.     Left Ear: Tympanic membrane is bulging.     Ears:     Weber exam findings: does not lateralize.    Right Rinne: BC > AC.    Left Rinne: AC > BC.    Comments: His ear canal is  extremely sensitive.  Increased cerumen to bilateral ears, tender to touch ear canal with lighted curette.   Cardiovascular:     Pulses: Normal pulses.     Heart sounds: Normal heart sounds. No murmur.  Pulmonary:     Effort: Pulmonary effort is normal. No respiratory distress.     Breath sounds: Normal breath sounds.  Skin:    Comments: Left lateral lower chest with worsening lesion, now approximately 6.5 cm length.  Has 3 brown irregular flat papules around.  Cratered center, no drainage currently.   Neurological:     General: No focal deficit present.     Mental Status: He is alert and oriented to person, place, and time.  Psychiatric:        Mood and Affect: Mood normal.        Behavior: Behavior normal.        Thought Content: Thought content normal.        Judgment: Judgment normal.         Assessment And Plan:    1. Bilateral hearing loss, unspecified hearing loss type  Difficulty with gross hearing, unable to clean ears well with lighted curette due to sensitive inner canal.   TM bulging to left ear  Unable to attempt tympanometer due to ear sensitivity.  Negative for lateralization and left ear AC>BC - Ambulatory referral to ENT  2. Non-seasonal allergic rhinitis, unspecified trigger  Will treat with loratadine daily. - loratadine (CLARITIN) 10 MG tablet; Take 1 tablet (10 mg total) by mouth daily.  Dispense: 30 tablet; Refill: 2  3. Serum potassium elevated  Slightly elevated at last visit  He has decreased his intake of prune juice  Will recheck potassium today - BMP8+eGFR  4. Localized skin eruption  Left thoracic/abdominal area anterior lesion has increased in size slightly, remains irregular  Now has hyperpigmented areas 3 of them surrounding the areas.    Contacted Dr. Elvera Lennox office to see if he can be seen earlier than May 2020    [name], FNP

## 2018-05-21 DIAGNOSIS — C44519 Basal cell carcinoma of skin of other part of trunk: Secondary | ICD-10-CM | POA: Diagnosis not present

## 2018-05-21 DIAGNOSIS — L821 Other seborrheic keratosis: Secondary | ICD-10-CM | POA: Diagnosis not present

## 2018-05-21 LAB — BMP8+EGFR
BUN/Creatinine Ratio: 17 (ref 10–24)
BUN: 23 mg/dL (ref 8–27)
CHLORIDE: 103 mmol/L (ref 96–106)
CO2: 23 mmol/L (ref 20–29)
Calcium: 9.9 mg/dL (ref 8.6–10.2)
Creatinine, Ser: 1.32 mg/dL — ABNORMAL HIGH (ref 0.76–1.27)
GFR calc Af Amer: 59 mL/min/{1.73_m2} — ABNORMAL LOW (ref >59)
GFR calc non Af Amer: 51 mL/min/{1.73_m2} — ABNORMAL LOW (ref >59)
Glucose: 62 mg/dL — ABNORMAL LOW (ref 65–99)
POTASSIUM: 4.9 mmol/L (ref 3.5–5.2)
Sodium: 141 mmol/L (ref 134–144)

## 2018-05-22 ENCOUNTER — Other Ambulatory Visit: Payer: Self-pay | Admitting: Nurse Practitioner

## 2018-05-29 DIAGNOSIS — H903 Sensorineural hearing loss, bilateral: Secondary | ICD-10-CM | POA: Diagnosis not present

## 2018-05-29 DIAGNOSIS — H6123 Impacted cerumen, bilateral: Secondary | ICD-10-CM | POA: Diagnosis not present

## 2018-06-11 DIAGNOSIS — J40 Bronchitis, not specified as acute or chronic: Secondary | ICD-10-CM | POA: Diagnosis not present

## 2018-06-11 DIAGNOSIS — C44519 Basal cell carcinoma of skin of other part of trunk: Secondary | ICD-10-CM | POA: Diagnosis not present

## 2018-06-11 DIAGNOSIS — J0140 Acute pansinusitis, unspecified: Secondary | ICD-10-CM | POA: Diagnosis not present

## 2018-06-17 ENCOUNTER — Ambulatory Visit: Payer: Medicare Other | Admitting: Cardiovascular Disease

## 2018-06-17 ENCOUNTER — Other Ambulatory Visit: Payer: Self-pay

## 2018-06-17 ENCOUNTER — Encounter: Payer: Self-pay | Admitting: Cardiovascular Disease

## 2018-06-17 VITALS — BP 132/70 | HR 62 | Ht 70.0 in | Wt 197.4 lb

## 2018-06-17 DIAGNOSIS — G4733 Obstructive sleep apnea (adult) (pediatric): Secondary | ICD-10-CM

## 2018-06-17 DIAGNOSIS — Z7901 Long term (current) use of anticoagulants: Secondary | ICD-10-CM

## 2018-06-17 DIAGNOSIS — Z9989 Dependence on other enabling machines and devices: Secondary | ICD-10-CM

## 2018-06-17 DIAGNOSIS — I480 Paroxysmal atrial fibrillation: Secondary | ICD-10-CM

## 2018-06-17 DIAGNOSIS — E781 Pure hyperglyceridemia: Secondary | ICD-10-CM | POA: Diagnosis not present

## 2018-06-17 DIAGNOSIS — I10 Essential (primary) hypertension: Secondary | ICD-10-CM | POA: Diagnosis not present

## 2018-06-17 NOTE — Progress Notes (Signed)
Patient ID: Mario Palmer, male   DOB: [birth year], 78 y.o.   MRN: [id number]    Primary M.D.: Dr. Bryon Lions  HPI: Mario Palmer is a 78 y.o. male who presents for a 7 month follow-up evaluation.  Mario Palmer has a history of hypertension, obstructive sleep apnea on CPAP therapy, GERD, and palpitations with atrial ectopy,. 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 He has history of hyperlipidemia and has been on simvastatin and had intermittently stopped this secondary to arthralgias.  An echo Doppler study in January 2015 revealed a normal systolic function with ejection fraction of 55-60%. There was evidence for mild aortic and mitral insufficiency. His left atrium was moderately dilated.  Mario Palmer was diagnosed with obstructive sleep apnea in 2010. He had mild sleep apnea with events worse in the supine position and during REM sleep. He has been on CPAP therapy since that time and initially he was started on an 8 cm water pressure but also this was later titrated to 10 cm.  He admits to 100% compliance.  His initial machine malfunctioned in 2017 and  He received a new AirSense 10 CPAP unit.  He has noticed significant improvement in this new machine.  Compared to his old one.  He admits to 100% compliance.  He goes to bed between 9:30 9:45 AM and wakes up once around 2:30 to the bathroom and typically sleeps 9-10 hours per night.  A download was obtained from his new machine from 05/07/2015 through 06/05/2015.  This revealed 100% compliance.  He is averaging 9 hours and 53 minutes per night.  He is set at a 10 cm water pressure.  His AHI was excellent at 1.1.  He uses a fullface mask.  There was no leak.    When I saw him in March 2018 his ECG demonstrated atrial fibrillation.  He was in this of questionable duration.  At that time, I initiated anticoagulation witheliquis  5 mg twice a day.  I discontinued amlodipine and started Cardizem CD 180 mg.  He underwent an echo  Doppler study on 06/23/2016 which showed an EF of 55-60%.  He did not have wall motion abnormalities.  There was mild aortic sclerosis with mildAR and mild focal calcification of the anterior mitral valve leaflet with trivial MR.  His right ventricle is mildly dilated.  His left atrium was severely dilated and measured 64 mm parasternally.  I initiated therapy with amiodarone as an antiarrhythmic agent, with the greatest likelihood for pharmacologic success.  I decrease Bystolic to 5 mg.  He started amiodarone at 200 mg first week and ultimately titrated this to 200 mg twice a day.  He has felt improved with control his ventricular rate.  He notices that his blood pressure is slightly increased on the reduced dose of Bystolic.  He admitted to 100% compliance with CPAP therapy.  He underwent successful DC cardioversion by me on 08/18/2016 with recurrence of sinus rhythm.  He has noticed improved energy since restoration of sinus rhythm.  He is unaware of any significant ectopy but does note a rare palpitation.  He has been exercising and pedals on a stationary bike for at least 40 minutes without chest pain or shortness of breath.  When  saw him in September 2018 he was unaware of recurrent atrial fibrillation.  He does admit to occasional palpitations at night.  Apparently, he had been on Bystolic 10 mg but inadvertently when he called his primary physician Bystolic  was renewed at 5 mg instead of the 10 mg dose.  The palpitations seem to have occurred on the reduced dose.  I last saw him in December and resume bystolic at 10 mg.  This has improved his palpitations and blood pressure.  He continues to use CPAP with 100% compliance.   A download from August 21 through 12/20/2016 revealed 100% of use with an average sleep duration of 9 hours and 11 minutes.  His AHI is excellent at 1.7 on a 10 cm set pressure.  His amiodarone dose was reduced in December and he now is on 200 mg daily, in addition to Cartia 240 mg,  lisinopril 40 mg in addition to spironolactone 25 mg daily for hypertension.  He called the office with complaints of his tongue becoming exceedingly dry.  After several hours of CPAP use.  He has a fullface mask.  He breathes through his mouth and remotely had used a nasal mask was switched to fullface mask due to oral venting.  He had complained of a very dry mouth, making it difficult for him to continue to sleep.  He was worked in and seen as an add-on one month ago.  At that time, we readjusted his medication.  We had subsequent telephone conversations with additional adjustment.  He went to his DME company and they adjusted his tube temperature to 70.    He underwent colonoscopy and endoscopy by Dr. Earlean Shawl and was found to have 9 polyps.  Apparently the biopsy was benign.  When I last saw him early April 2019 he was continuing to have difficulty with his new CPAP machine.  Subsequently he saw [facility] as his DME company.  He was given a chinstrap.  Addition, he was advised by his daughter-in-law's dentist to use Somnifix to assist his mouth breathing.  He feels since implementing this mouth taped, he has noticed marked improvement in his sleep.  He is now sleeping at least 9 hours.  His sleep is restorative.  He has noticed his blood pressure now is significantly more controlled.  He brought with him blood pressure readings from home and his systolic blood pressures have ranged from in the low to mid 90s to the 120s.  He has been unaware of any recurrent episodes of atrial fibrillation.  He denies episodes of chest pain.  He continues to be on anticoagulation for PAF.  He had follow-up laboratory in April which showed a total cholesterol 188 triglycerides 190 LDL 93 HDL 57.  PSA was increased at 6.2.  Creatinine was 1.32.  He had previously seen Dr. Bryon Lions for primary care but apparently her office is no longer taking [employer].    When I saw him in August 2019, he was  maintaining sinus rhythm on amiodarone 200 mg daily in addition to Bystolic 5 mg.  He also was on lisinopril 40 mg in addition to spironolactone for hypertension.  He had been on low-dose simvastatin and particularly with its interaction with Cardizem and an LDL of 93 I suggested he discontinue simvastatin and started him on atorvastatin he milligrams.  He tells me he had retinal surgery in January.  He had undergone excision of a squamous cell cancer from his abdomen and was told of clear margins.  He recently fell on his face after he kept on his shoelace and help significant ecchymoses to his nose and under his eyes bilaterally.  He continues to use CPAP.  He presents for evaluation.  Past Medical History:  Diagnosis Date   GERD (gastroesophageal reflux disease)    High cholesterol    Hx of echocardiogram 12/2010   EF 55%, There is Stage 1 diastolic dyfunction. Normal LV filling pressure. Aortic sclerosis with mild AI, mild TR   Hypertension    Palpitation    with ecotopy   Sleep apnea     Past Surgical History:  Procedure Laterality Date   CARDIOVERSION N/A 09/12/2016   Procedure: CARDIOVERSION;  Surgeon: Troy Sine, MD;  Location: [facility] ENDOSCOPY;  Service: Cardiovascular;  Laterality: N/A;   RETINAL DETACHMENT SURGERY Right     No Known Allergies  Current Outpatient Medications  Medication Sig Dispense Refill   amiodarone (PACERONE) 200 MG tablet Take 200 mg by mouth daily.     amoxicillin-clavulanate (AUGMENTIN) 875-125 MG tablet Take 1 tablet by mouth 2 (two) times daily.     apixaban (ELIQUIS) 5 MG TABS tablet Take 1 tablet (5 mg total) by mouth 2 (two) times daily. 180 tablet 1   aspirin 81 MG chewable tablet Chew 81 mg by mouth daily.     BYSTOLIC 10 MG tablet TAKE ONE TABLET BY MOUTH ONE TIME DAILY  90 tablet 1   Cholecalciferol (VITAMIN D) 2000 UNITS tablet Take 2,000 Units by mouth daily.     diltiazem (CARDIZEM CD) 120 MG 24 hr capsule Take 1 capsule (120  mg total) by mouth at bedtime. 90 capsule 3   fluticasone (FLONASE) 50 MCG/ACT nasal spray PLACE 1 SPRAY IN EACH NOSTRIL ONCE A DAY 16 g 2   lisinopril (PRINIVIL,ZESTRIL) 40 MG tablet TAKE ONE TABLET BY MOUTH ONE TIME DAILY *PATIENT NEEDS AN APPOINTMENT* 90 tablet 0   loratadine (CLARITIN) 10 MG tablet Take 1 tablet (10 mg total) by mouth daily. 30 tablet 2   NON FORMULARY CPAP therapy     Saccharomyces boulardii (PROBIOTIC) 250 MG CAPS Take 1 capsule by mouth daily.     spironolactone (ALDACTONE) 25 MG tablet take 1 tablet by mouth daily 90 tablet 1   Vitamin E 400 units TABS Take 1 tablet by mouth daily.     atorvastatin (LIPITOR) 20 MG tablet Take 1 tablet (20 mg total) by mouth daily. 90 tablet 3   No current facility-administered medications for this visit.     Social History   Socioeconomic History   Marital status: Married    Spouse name: Not on file   Number of children: Not on file   Years of education: Not on file   Highest education level: Not on file  Occupational History   Not on file  Social Needs   Financial resource strain: Not on file   Food insecurity:    Worry: Not on file    Inability: Not on file   Transportation needs:    Medical: Not on file    Non-medical: Not on file  Tobacco Use   Smoking status: Former Smoker    Types: Cigarettes   Smokeless tobacco: Never Used   Tobacco comment: quit smoking in 1982  Substance and Sexual Activity   Alcohol use: No   Drug use: No   Sexual activity: Not on file  Lifestyle   Physical activity:    Days per week: Not on file    Minutes per session: Not on file   Stress: Not on file  Relationships   Social connections:    Talks on phone: Not on file    Gets together: Not on file  Attends religious service: Not on file    Active member of club or organization: Not on file    Attends meetings of clubs or organizations: Not on file    Relationship status: Not on file   Intimate  partner violence:    Fear of current or ex partner: Not on file    Emotionally abused: Not on file    Physically abused: Not on file    Forced sexual activity: Not on file  Other Topics Concern   Not on file  Social History Narrative   Not on file    No family history on file.  Social history is notable in that he is married; 2 children and 2 grandchildren. 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 There is no tobacco or alcohol use.  ROS General: Negative; No fevers, chills, or night sweats;  HEENT: Negative; No changes in vision or hearing, sinus congestion, difficulty swallowing Pulmonary: Negative; No cough, wheezing, shortness of breath, hemoptysis Cardiovascular:  Negative; No chest pain, presyncope, syncope, GI: Negative; No nausea, vomiting, diarrhea, or abdominal pain GU: Positive for mild PSA elevation for which he was treated with an antibiotic.  Repeat laboratory will be forthcoming by Dr. Bryon Lions.  No dysuria, hematuria, or difficulty voiding Musculoskeletal: Negative; no myalgias, joint pain, or weakness Hematologic/Oncology: Negative; no easy bruising, bleeding Endocrine: Negative; no heat/cold intolerance; no diabetes Neuro: Negative; no changes in balance, headaches Skin: Negative; No rashes or skin lesions Psychiatric: Negative; No behavioral problems, depression Sleep: Positive for obstructive sleep apnea on CPAP therapy.  No snoring, daytime sleepiness, hypersomnolence, bruxism, restless legs, hypnogognic hallucinations, no cataplexy Other comprehensive 14 point system review is negative.  PE: BP 132/70    Pulse 62    Ht '5\' 10"'  (1.778 m)    Wt 197 lb 6.4 oz (89.5 kg)    BMI 28.32 kg/m    Repeat blood pressure by me was 120/70 without orthostatic change  Wt Readings from Last 3 Encounters:  09/22/16 192 lb (87.1 kg)  09/12/16 198 lb (89.8 kg)  08/18/16 194 lb (88 kg)   General: Alert, oriented, no distress.  Skin: Ecchymosis below both  eyes and on the bridge of his nose from his recent fall. HEENT: Normocephalic, atraumatic. Pupils equal round and reactive to light; sclera anicteric; extraocular muscles intact;  Nose without nasal septal hypertrophy Mouth/Parynx benign; Mallinpatti scale 3 Neck: No JVD, no carotid bruits; normal carotid upstroke Lungs: clear to ausculatation and percussion; no wheezing or rales Chest wall: without tenderness to palpitation Heart: PMI not displaced, RRR, s1 s2 normal, 1/6 systolic murmur, no diastolic murmur, no rubs, gallops, thrills, or heaves Abdomen: soft, nontender; no hepatosplenomehaly, BS+; abdominal aorta nontender and not dilated by palpation. Back: no CVA tenderness Pulses 2+ Musculoskeletal: full range of motion, normal strength, no joint deformities Extremities: no clubbing cyanosis or edema, Homan's sign negative  Neurologic: grossly nonfocal; Cranial nerves grossly wnl Psychologic: Normal mood and affect   ECG (independently read by me): Normal sinus rhythm at 62 bpm.  PR interval 200 ms.  QTc interval 444 ms.  No ectopy.  November 28, 2017 ECG (independently read by me): Sinus Bradycardia at 51, degree AV block.  No ectopy.  April 2019 ECG (independently read by me): Sinus bradycardia at 49 bpm with mild sinus arrhythmia.  First-degree AV block with a paratubal 2 to 2 ms.  No ST segment changes.  06/01/2017 ECG (independently read by me):Sinus bradycardia 58 bpm.  First-degree AV  block.  05/11/2017 ECG (independently read by me): Sinus bradycardia at 67 bpm.  First degree AV block with a PR interval 214 ms.  QTC normal 434 ms.  December 2018 ECG (independently read by me):  Normal sinus rhythm at 63 bpm. PR interval 256m.  QTc nterval 466 ms. No significant ST-T changes.  September 2018 ECG (independently read by me): Sinus bradycardia 52 bpm.  QTC increased at 487 ms.  No CMV ST-T change.  June 2018 ECG (independently read by me): Normal sinus rhythm at 62 bpm.  Left  axis deviation.  QTc interval 479 ms.  No significant ST-T changes  May 2018 ECG (independently read by me): atrial flib  /flutter with coarse laboratory wave.  07/13/2016 ECG (independently read by me): Atrial fibrillation with a ventricular rate in the 50s.  Mild RV conduction delay.  March 2018 ECG (independently read by me): Atrial fibrillation at 63 bpm.  QTc interval 427 ms.  March 2016 ECG (independently read by me): Sinus bradycardia 59 bpm.  Mild RV conduction delay.  Normal intervals.  March 2015 ECG: Normal sinus rhythm at 60 beats per minute. No ectopy. Nonspecific T changes in lead 3.  02/04/2013 ECG: Sinus bradycardia with PACs.  In the office today I obtained a new download from October 29, 2017 through November 27, 2017.  During this time he has been using some to fix on a daily basis.  Compliance is 100%.  He is averaging 9 hours and 19 minutes of sleep per night.  At a 10 cm set pressure, AHI is excellent at 1.2.  There is no leak.  LABS:  BMP Latest Ref Rng & Units 05/20/2018 03/15/2018 01/03/2017  Glucose 65 - 99 mg/dL 62(L) 78 75  BUN 8 - 27 mg/dL '23 19 16  ' Creatinine 0.76 - 1.27 mg/dL 1.32(H) 1.49(H) 1.11  BUN/Creat Ratio 10 - '24 17 13 14  ' Sodium 134 - 144 mmol/L 141 142 144  Potassium 3.5 - 5.2 mmol/L 4.9 5.3(H) 4.8  Chloride 96 - 106 mmol/L 103 102 106  CO2 20 - 29 mmol/L '23 25 24  ' Calcium 8.6 - 10.2 mg/dL 9.9 9.9 9.8    Hepatic Function Latest Ref Rng & Units 03/15/2018 06/07/2016 06/03/2011  Total Protein 6.0 - 8.5 g/dL 7.0 7.1 6.9  Albumin 3.5 - 4.8 g/dL 4.3 4.2 3.9  AST 0 - 40 IU/L '20 18 30  ' ALT 0 - 44 IU/L '26 18 23  ' Alk Phosphatase 39 - 117 IU/L 62 54 59  Total Bilirubin 0.0 - 1.2 mg/dL 0.8 1.3(H) 0.8   CBC Latest Ref Rng & Units 09/04/2016 06/07/2016 06/03/2011  WBC 3.8 - 10.8 K/uL 7.1 6.7 -  Hemoglobin 13.2 - 17.1 g/dL 15.2 15.5 14.3  Hematocrit 38.5 - 50.0 % 45.6 46.1 42.0  Platelets 140 - 400 K/uL 241 206 -   Lab Results  Component Value Date   MCV 93.1  09/04/2016   MCV 91.8 06/07/2016   MCV 87.4 06/03/2011   Lab Results  Component Value Date   TSH 2.23 06/07/2016     BNP No results found for: PROBNP  Lipid Panel     Component Value Date/Time   CHOL 178 03/15/2018 0903   TRIG 128 03/15/2018 0903   HDL 63 03/15/2018 0903   CHOLHDL 2.8 03/15/2018 0903   CHOLHDL 3.0 06/07/2016 1030   VLDL 28 06/07/2016 1030   LDLCALC 89 03/15/2018 0903    RADIOLOGY: No results found.  IMPRESSION:  No diagnosis found.  ASSESSMENT AND PLAN: Mario Palmer is a 78 years old gentleman who has a history of hypertension, obstructive sleep apnea, palpitations with documented atrial ectopy, and hyperlipidemia.  He had developed atrial fibrillation/flutter and after titration of amiodarone, ultimately underwent successful DC cardioversion on 08/18/2016 with resolution of atrial flutter and restoration of sinus rhythm.  He has been on chronic eliquis for anticoagulation and denies bleeding.   He had held his Eliquis for 48 hours prior to his colonoscopy which revealed 9 polyps which was successfully biopsied.  After 48 hours following his procedure he again then resumed Eliquis.  His blood pressure today is stable on his current regimen consisting of Bystolic 10 mg, diltiazem 120 mg, spironolactone 25 mg.  He continues to be on amiodarone 200 mg daily.  He is maintaining sinus rhythm without recurrent atrial fibrillation.  He has been on aspirin and Eliquis 5 mg twice a day.  I have recommended he discontinue aspirin.  He continues to use CPAP and admits to 100% compliance.  I reviewed recent laboratory from December 2019.  He is tolerating atorvastatin 20 mg.  Most recent LDL was 89 in December 2019 and repeat laboratory will be obtained by his primary physician.  If this level increases further titration of amiodarone to 40 mg will be recommended.  Time spent: 25 minutes  Troy Sine, MD, [facility]  06/17/2018 10:00 AM

## 2018-06-17 NOTE — Patient Instructions (Signed)

## 2018-06-18 ENCOUNTER — Encounter: Payer: Self-pay | Admitting: Cardiovascular Disease

## 2018-06-20 ENCOUNTER — Emergency Department (HOSPITAL_COMMUNITY): Payer: Medicare Other

## 2018-06-20 ENCOUNTER — Emergency Department (HOSPITAL_COMMUNITY)
Admission: EM | Admit: 2018-06-20 | Discharge: 2018-06-20 | Disposition: A | Discharge status: Home / Self Care | Payer: Medicare Other | Attending: Emergency Medicine | Admitting: Emergency Medicine

## 2018-06-20 ENCOUNTER — Encounter (HOSPITAL_COMMUNITY): Payer: Self-pay | Admitting: Emergency Medicine

## 2018-06-20 ENCOUNTER — Other Ambulatory Visit: Payer: Self-pay

## 2018-06-20 DIAGNOSIS — I959 Hypotension, unspecified: Secondary | ICD-10-CM | POA: Diagnosis not present

## 2018-06-20 DIAGNOSIS — R55 Syncope and collapse: Secondary | ICD-10-CM | POA: Diagnosis not present

## 2018-06-20 DIAGNOSIS — Z7901 Long term (current) use of anticoagulants: Secondary | ICD-10-CM | POA: Diagnosis not present

## 2018-06-20 DIAGNOSIS — Z79899 Other long term (current) drug therapy: Secondary | ICD-10-CM | POA: Insufficient documentation

## 2018-06-20 DIAGNOSIS — I5030 Unspecified diastolic (congestive) heart failure: Secondary | ICD-10-CM | POA: Diagnosis not present

## 2018-06-20 DIAGNOSIS — R040 Epistaxis: Secondary | ICD-10-CM | POA: Diagnosis not present

## 2018-06-20 DIAGNOSIS — I110 Hypertensive heart disease with heart failure: Secondary | ICD-10-CM | POA: Insufficient documentation

## 2018-06-20 DIAGNOSIS — Z87891 Personal history of nicotine dependence: Secondary | ICD-10-CM | POA: Diagnosis not present

## 2018-06-20 DIAGNOSIS — R58 Hemorrhage, not elsewhere classified: Secondary | ICD-10-CM | POA: Diagnosis not present

## 2018-06-20 DIAGNOSIS — W19XXXA Unspecified fall, initial encounter: Secondary | ICD-10-CM | POA: Diagnosis not present

## 2018-06-20 LAB — BASIC METABOLIC PANEL
Anion gap: 8 (ref 5–15)
BUN: 29 mg/dL — ABNORMAL HIGH (ref 8–23)
CALCIUM: 8.9 mg/dL (ref 8.9–10.3)
CO2: 23 mmol/L (ref 22–32)
Chloride: 103 mmol/L (ref 98–111)
Creatinine, Ser: 1.57 mg/dL — ABNORMAL HIGH (ref 0.61–1.24)
GFR calc Af Amer: 48 mL/min — ABNORMAL LOW (ref >60)
GFR calc non Af Amer: 41 mL/min — ABNORMAL LOW (ref >60)
Glucose, Bld: 106 mg/dL — ABNORMAL HIGH (ref 70–99)
Potassium: 4.4 mmol/L (ref 3.5–5.1)
SODIUM: 134 mmol/L — AB (ref 135–145)

## 2018-06-20 LAB — CBC
HCT: 34.7 % — ABNORMAL LOW (ref 39.0–52.0)
Hemoglobin: 11 g/dL — ABNORMAL LOW (ref 13.0–17.0)
MCH: 30.4 pg (ref 26.0–34.0)
MCHC: 31.7 g/dL (ref 30.0–36.0)
MCV: 95.9 fL (ref 80.0–100.0)
PLATELETS: 280 10*3/uL (ref 150–400)
RBC: 3.62 MIL/uL — AB (ref 4.22–5.81)
RDW: 12.6 % (ref 11.5–15.5)
WBC: 14.9 10*3/uL — ABNORMAL HIGH (ref 4.0–10.5)
nRBC: 0 % (ref 0.0–0.2)

## 2018-06-20 LAB — URINALYSIS, ROUTINE W REFLEX MICROSCOPIC
Bilirubin Urine: NEGATIVE
Glucose, UA: NEGATIVE mg/dL
Hgb urine dipstick: NEGATIVE
Ketones, ur: 5 mg/dL — AB
Leukocytes,Ua: NEGATIVE
Nitrite: NEGATIVE
PROTEIN: NEGATIVE mg/dL
Specific Gravity, Urine: 1.015 (ref 1.005–1.030)
pH: 6 (ref 5.0–8.0)

## 2018-06-20 LAB — CBG MONITORING, ED: Glucose-Capillary: 95 mg/dL (ref 70–99)

## 2018-06-20 MED ORDER — LIDOCAINE-EPINEPHRINE (PF) 2 %-1:200000 IJ SOLN
10.0000 mL | Freq: Once | INTRAMUSCULAR | Status: AC
  Administered 2018-06-20: 10 mL via INTRADERMAL
  Filled 2018-06-20: qty 20

## 2018-06-20 MED ORDER — SODIUM CHLORIDE 0.9 % IV BOLUS
500.0000 mL | Freq: Once | INTRAVENOUS | Status: AC
  Administered 2018-06-20: 500 mL via INTRAVENOUS

## 2018-06-20 MED ORDER — OXYMETAZOLINE HCL 0.05 % NA SOLN
1.0000 | Freq: Once | NASAL | Status: AC
  Administered 2018-06-20: 1 via NASAL
  Filled 2018-06-20: qty 30

## 2018-06-20 MED ORDER — LIDOCAINE HCL URETHRAL/MUCOSAL 2 % EX GEL
1.0000 "application " | Freq: Once | CUTANEOUS | Status: AC
  Administered 2018-06-20: 1 via TOPICAL
  Filled 2018-06-20: qty 20

## 2018-06-20 MED ORDER — AMOXICILLIN-POT CLAVULANATE 875-125 MG PO TABS: 1.0000 | ORAL_TABLET | Freq: Two times a day (BID) | ORAL | 0 refills | Status: AC

## 2018-06-20 NOTE — ED Notes (Signed)
Pt had just finished getting orthostatic vital signs done and was using the urinal when he began to have another nose bleed out of the right nare. Same was originally controlled with Afrin. Will attempt this again.

## 2018-06-20 NOTE — ED Triage Notes (Signed)
Pt BIB [facility] for a syncopal episode. Pt originally called [facility] for a nose bleed and then refused transport at that time because same was controlled. EMS reports that approximately 5 or so minutes after EMS refused he began to have a nose bleed again and called [facility] again. EMS advised one of their medic units responded to the scene to assess pt and while doing orthostatic vital signs he passed out and his heart rate dropped. The medic unit called for a transport truck who brought pt in. Bleeding controlled at this time. EMS advised they gave 544ml of NS in the documented IV. EMS advised 12 lead was unremarkable. EMS advised the pt was CAO on their arrival for transport. EMS advised pt had no changes while in route.   Pt reports he went to the bathroom and felt something in his throat at about 0130. Pt reports taking eliquis due to a-fib. Pt reports he fell last Friday which is the reason he has bruises under his eye. Pt denies LOC at that time.

## 2018-06-20 NOTE — ED Notes (Signed)
Pt ambulated to restroom, steady gait without dizziness

## 2018-06-20 NOTE — ED Notes (Signed)
Patient verbalizes understanding of discharge instructions. Opportunity for questioning and answers were provided. Armband removed by staff, pt discharged from ED. Wheeled out to lobby  

## 2018-06-20 NOTE — Discharge Instructions (Addendum)
You were evaluated today for near syncope as well as nosebleed.  Your work-up was negative in the department.  You do have packing placed to your right nose.  I have written a prescription for additional antibiotics.  Please begin this medication after you complete your other course of antibiotics.  Follow up with Dr. Lucia Gaskins, your ENT physician in the next 3 to 5 days to have your packing removed.  If you begin to have additional nosebleeding please seek evaluation.  To the ED for any worsening symptoms.

## 2018-06-20 NOTE — ED Provider Notes (Signed)
[facility] EMERGENCY DEPARTMENT Provider Note   CSN: [id number] Arrival date & time: 06/20/18  0430    History   Chief Complaint Chief Complaint  Patient presents with  . Loss of Consciousness  . orthostatic hypotension    HPI Mario Palmer is a 78 y.o. male.     HPI Patient presents to the emergency department with a near syncopal episode.  Patient had a nosebleed earlier and states that after the ENTs left knee bent down to pick up her shirt and became lightheaded and clammy.  The patient states that he did not pass out and the daughter was standing there and verified that he was not passed out or unresponsive.  The patient denies chest pain, shortness of breath, headache,blurred vision, neck pain, fever, cough, weakness, numbness, dizziness, anorexia, edema, abdominal pain, nausea, vomiting, diarrhea, rash, back pain, dysuria, hematemesis, bloody stool, or syncope. Past Medical History:  Diagnosis Date  . GERD (gastroesophageal reflux disease)   . High cholesterol   . Hx of echocardiogram 12/2010   EF 55%, There is Stage 1 diastolic dyfunction. Normal LV filling pressure. Aortic sclerosis with mild AI, mild TR  . Hypertension   . Palpitation    with ecotopy  . Sleep apnea     Patient Active Problem List   Diagnosis Date Noted  . Non-seasonal allergic rhinitis 05/20/2018  . Bilateral hearing loss 05/20/2018  . Localized skin eruption 05/20/2018  . Mixed hyperlipidemia 03/15/2018  . Elevated PSA 03/15/2018  . HTN (hypertension) 06/17/2013  . Vitamin D insufficiency 06/17/2013  . OSA on CPAP 02/06/2013  . Hyperlipidemia 02/06/2013  . GERD (gastroesophageal reflux disease) 02/06/2013  . Traumatic closed fracture of C2 vertebra with minimal displacement ([place]) 06/03/2011    Past Surgical History:  Procedure Laterality Date  . CARDIOVERSION N/A 09/12/2016   Procedure: CARDIOVERSION;  Surgeon: Troy Sine, MD;  Location: [facility] ENDOSCOPY;   Service: Cardiovascular;  Laterality: N/A;  . RETINAL DETACHMENT SURGERY Right         Home Medications    Prior to Admission medications   Medication Sig Start Date End Date Taking? Authorizing Provider  amiodarone (PACERONE) 200 MG tablet Take 200 mg by mouth daily.   Yes [provider]  amoxicillin-clavulanate (AUGMENTIN) 875-125 MG tablet Take 1 tablet by mouth 2 (two) times daily. 06/11/18 06/21/18 Yes [provider]  apixaban (ELIQUIS) 5 MG TABS tablet Take 1 tablet (5 mg total) by mouth 2 (two) times daily. 08/08/17  Yes Troy Sine, MD  atorvastatin (LIPITOR) 20 MG tablet Take 1 tablet (20 mg total) by mouth daily. 11/28/17 06/20/26 Yes Troy Sine, MD  BYSTOLIC 10 MG tablet TAKE ONE TABLET BY MOUTH ONE TIME DAILY  Patient taking differently: Take 5 mg by mouth daily.  04/29/18  Yes [name], FNP  Cholecalciferol (VITAMIN D) 2000 UNITS tablet Take 2,000 Units by mouth daily.   Yes [provider]  diltiazem (CARDIZEM CD) 120 MG 24 hr capsule Take 1 capsule (120 mg total) by mouth at bedtime. 11/28/17  Yes Troy Sine, MD  fluticasone (FLONASE) 50 MCG/ACT nasal spray PLACE 1 SPRAY IN EACH NOSTRIL ONCE A DAY Patient taking differently: Place 1 spray into both nostrils daily.  05/23/18  Yes [name], FNP  lisinopril (PRINIVIL,ZESTRIL) 40 MG tablet TAKE ONE TABLET BY MOUTH ONE TIME DAILY *PATIENT NEEDS AN APPOINTMENT* Patient taking differently: Take 40 mg by mouth daily.  04/10/18  Yes [name], FNP  loratadine (  CLARITIN) 10 MG tablet Take 1 tablet (10 mg total) by mouth daily. 05/20/18  Yes [name], FNP  Saccharomyces boulardii (PROBIOTIC) 250 MG CAPS Take 1 capsule by mouth daily.   Yes [provider]  spironolactone (ALDACTONE) 25 MG tablet take 1 tablet by mouth daily 12/07/17  Yes Troy Sine, MD  Vitamin E 400 units TABS Take 1 tablet by mouth daily.   Yes [provider]  NON FORMULARY CPAP therapy     [provider]    Family History No family history on file.  Social History Social History   Tobacco Use  . Smoking status: Former Smoker    Types: Cigarettes  . Smokeless tobacco: Never Used  . Tobacco comment: quit smoking in 1982  Substance Use Topics  . Alcohol use: No  . Drug use: No     Allergies   Patient has no known allergies.   Review of Systems Review of Systems All other systems negative except as documented in the HPI. All pertinent positives and negatives as reviewed in the HPI.  Physical Exam Updated Vital Signs BP 103/63   Pulse 64   Temp 99.3 F (37.4 C) (Oral)   Resp 17   Ht 5\' 10"  (1.778 m)   Wt 89.5 kg   SpO2 98%   BMI 28.32 kg/m   Physical Exam Vitals signs and nursing note reviewed.  Constitutional:      General: He is not in acute distress.    Appearance: He is well-developed.  HENT:     Head: Normocephalic and atraumatic.  Eyes:     Pupils: Pupils are equal, round, and reactive to light.  Neck:     Musculoskeletal: Normal range of motion and neck supple.  Cardiovascular:     Rate and Rhythm: Normal rate and regular rhythm.     Heart sounds: Normal heart sounds. No murmur. No friction rub. No gallop.   Pulmonary:     Effort: Pulmonary effort is normal. No respiratory distress.     Breath sounds: Normal breath sounds. No wheezing.  Abdominal:     General: Bowel sounds are normal. There is no distension.     Palpations: Abdomen is soft.     Tenderness: There is no abdominal tenderness.  Skin:    General: Skin is warm and dry.     Capillary Refill: Capillary refill takes less than 2 seconds.     Findings: No erythema or rash.  Neurological:     Mental Status: He is alert and oriented to person, place, and time.     Cranial Nerves: No cranial nerve deficit.     Sensory: No sensory deficit.     Motor: No weakness or abnormal muscle tone.     Coordination: Coordination normal.  Psychiatric:        Mood and Affect:  Mood normal.        Behavior: Behavior normal.      ED Treatments / Results  Labs (all labs ordered are listed, but only abnormal results are displayed) Labs Reviewed  BASIC METABOLIC PANEL - Abnormal; Notable for the following components:      Result Value   Sodium 134 (*)    Glucose, Bld 106 (*)    BUN 29 (*)    Creatinine, Ser 1.57 (*)    GFR calc non Af Amer 41 (*)    GFR calc Af Amer 48 (*)    All other components within normal limits  CBC - Abnormal;  Notable for the following components:   WBC 14.9 (*)    RBC 3.62 (*)    Hemoglobin 11.0 (*)    HCT 34.7 (*)    All other components within normal limits  URINALYSIS, ROUTINE W REFLEX MICROSCOPIC  CBG MONITORING, ED    EKG EKG Interpretation  Date/Time:  Thursday June 20 2018 04:39:06 EDT Ventricular Rate:  63 PR Interval:    QRS Duration: 104 QT Interval:  422 QTC Calculation: 432 R Axis:   -29 Text Interpretation:  Sinus rhythm Borderline left axis deviation Abnormal R-wave progression, early transition No significant change since last tracing Confirmed by Duffy Bruce [phone]) on 06/20/2018 5:17:54 AM   Radiology Ct Head Wo Contrast  Result Date: 06/20/2018 CLINICAL DATA:  Nose bleed and syncopal episode on blood thinners EXAM: CT HEAD WITHOUT CONTRAST TECHNIQUE: Contiguous axial images were obtained from the base of the skull through the vertex without intravenous contrast. COMPARISON:  06/03/2011 FINDINGS: Brain: No evidence of acute infarction, hemorrhage, hydrocephalus, extra-axial collection or mass lesion/mass effect. Vascular: No hyperdense vessel or unexpected calcification. Skull: Normal. Negative for fracture or focal lesion. Left parietal scalp scarring. Sinuses/Orbits: No acute finding. IMPRESSION: No acute finding.  No evidence of intracranial injury. Electronically Signed   By: Monte Fantasia M.D.   On: 06/20/2018 05:44    Procedures Procedures (including critical care time)  Medications Ordered  in ED Medications - No data to display   Initial Impression / Assessment and Plan / ED Course  I have reviewed the triage vital signs and the nursing notes.  Pertinent labs & imaging results that were available during my care of the patient were reviewed by me and considered in my medical decision making (see chart for details).       Patient to be watched for the next hour to make sure that his nasal packing does not continue to bleed.  Patient has been stable here in the emergency department otherwise.  Patient is advised he will need to follow-up with his ENT doctor.   Final Clinical Impressions(s) / ED Diagnoses   Final diagnoses:  None    ED Discharge Orders    None       Dalia Heading, PA-C 06/21/18 3734    Duffy Bruce, MD 06/22/18 1859

## 2018-06-20 NOTE — ED Provider Notes (Signed)
Care transferred from [facility] at shift change.  See note for full HPI.  In summation, 78 year old male history of atrial fibrillation on Eliquis who presents for evaluation of near syncopal episode as well as nosebleed.  Patient states he had a nosebleed earlier today and after the EMS left he bent down to pick something up and he became lightheaded at that time. Patient called EMS again at that time. States he did not have a syncopal episode, however just "felt like I was going to pass out."  Daughter was present with patient and verifies that he did not have a syncopal episode did not have any episodes of unresponsiveness.  Patient denied at that time chest pain, shortness of breath, headache, blurred vision, neck pain, fever cough, shortness of breath, numbness or tingling, abd pain.  Per EMS note patient was orthostatic on arrival.  Patient given 500 cc fluid bolus by EMS.  Per previous provider, labs reassuring without acute abnormality.  CT head negative. Patient epistaxis in ED requiring nasal packing placed to right nares.  At shift change will have patient ambulate as well as reassess epistaxis in 1 hour and reassess.  Patient is currently on Augmentin.  If dc home will extend course of 5 days and have patient follow-up with ENT in 3-5 days for packing removal. Does not need to stop anticoagulation at dc.   If continued epistaxis on reassessment after packing will plan for ENT consult.  ENT- Dr. Lucia Gaskins  Physical Exam  BP 111/67   Pulse 73   Temp 99.3 F (37.4 C) (Oral)   Resp 19   Ht 5\' 10"  (1.778 m)   Wt 89.5 kg   SpO2 99%   BMI 28.32 kg/m   Physical Exam Vitals signs and nursing note reviewed.  Constitutional:      General: He is not in acute distress.    Appearance: He is not ill-appearing, toxic-appearing or diaphoretic.  HENT:     Head: Normocephalic and atraumatic.     Jaw: There is normal jaw occlusion.     Right Ear: Tympanic membrane, ear canal and external ear  normal. There is no impacted cerumen. No hemotympanum. Tympanic membrane is not injected, scarred, perforated, erythematous, retracted or bulging.     Left Ear: Tympanic membrane, ear canal and external ear normal. There is no impacted cerumen. No hemotympanum. Tympanic membrane is not injected, scarred, perforated, erythematous, retracted or bulging.     Ears:     Comments: No Mastoid tenderness.    Nose:     Comments: Patient with Rhino Rocket packing right nares.  No active bleeding.  No bleeding from left nares.    Mouth/Throat:     Comments: Posterior oropharynx clear.  Mucous membranes moist.  Tonsils without erythema or exudate.  Uvula midline without deviation.  No evidence of PTA or RPA.  No drooling, dysphasia or trismus.  Phonation normal. Neck:     Trachea: Trachea and phonation normal.     Meningeal: Brudzinski's sign and Kernig's sign absent.     Comments: No Neck stiffness or neck rigidity.  No meningismus.  No cervical lymphadenopathy. Cardiovascular:     Comments: No murmurs rubs or gallops. Pulmonary:     Comments: Clear to auscultation bilaterally without wheeze, rhonchi or rales.  No accessory muscle usage.  Able speak in full sentences. Abdominal:     Comments: Soft, nontender without rebound or guarding.  No CVA tenderness.  Musculoskeletal:     Comments: Moves all 4  extremities without difficulty.  Lower extremities without edema, erythema or warmth.  Skin:    Comments: Brisk capillary refill.  No rashes or lesions.  Neurological:     Mental Status: He is alert.     Comments: Ambulatory in department without difficulty.  Cranial nerves II through XII grossly intact.  No facial droop.  No aphasia.     ED Course/Procedures     Procedures Labs Reviewed  BASIC METABOLIC PANEL - Abnormal; Notable for the following components:      Result Value   Sodium 134 (*)    Glucose, Bld 106 (*)    BUN 29 (*)    Creatinine, Ser 1.57 (*)    GFR calc non Af Amer 41 (*)     GFR calc Af Amer 48 (*)    All other components within normal limits  CBC - Abnormal; Notable for the following components:   WBC 14.9 (*)    RBC 3.62 (*)    Hemoglobin 11.0 (*)    HCT 34.7 (*)    All other components within normal limits  URINALYSIS, ROUTINE W REFLEX MICROSCOPIC - Abnormal; Notable for the following components:   Ketones, ur 5 (*)    All other components within normal limits  CBG MONITORING, ED  Ct Head Wo Contrast  Result Date: 06/20/2018 CLINICAL DATA:  Nose bleed and syncopal episode on blood thinners EXAM: CT HEAD WITHOUT CONTRAST TECHNIQUE: Contiguous axial images were obtained from the base of the skull through the vertex without intravenous contrast. COMPARISON:  06/03/2011 FINDINGS: Brain: No evidence of acute infarction, hemorrhage, hydrocephalus, extra-axial collection or mass lesion/mass effect. Vascular: No hyperdense vessel or unexpected calcification. Skull: Normal. Negative for fracture or focal lesion. Left parietal scalp scarring. Sinuses/Orbits: No acute finding. IMPRESSION: No acute finding.  No evidence of intracranial injury. Electronically Signed   By: Monte Fantasia M.D.   On: 06/20/2018 05:44   MDM  Care transferred from [facility] at shift change. See previous provider note.  In summation, 78 year old male presented for evaluation of near syncope and nosebleed.  Neurologic exam without neurologic deficits.  Orthostatic with EMS.  Given 500 cc bolus at the time.  Patient not orthostatic on arrival to ED.  Had negative work-up per previous provider.  Labs reassuring.  CT head negative for acute findings.  Patient did have active epistaxis in ED, not resolved with Afrin.  Nasal packing was placed to right nares.  Imaging and labs personally reviewed.  Care transfer patient pending reassessment 1 hour after packing as well as trial of ambulation.  0355: On reevaluation patient with packing in place to right nares.  No active bleeding.  Patient is  currently eating breakfast without difficulty.  He has no complaints.  Will attempt ambulation and reassess.  0845: Patient able to ambulate to restroom with steady gait and without dizziness.  Negative orthostatics in department.  Patient states his symptoms have improved and would like discharge at this time. Patient hemodynamic stable and appropriate for DC home this time.  He has not had any further epistaxis after his nasal packing was placed.  Discussed with patient return precautions.  Will extend course of antibiotics that he is currently on.  Patient is followed by Dr. Lucia Gaskins, with ENT.  Discussed follow-up within 3 to 5 days for reevaluation to have his packing removed.  Patient and daughter are present in room and voiced understanding return precautions and are agreeable for follow-up.  Low suspicion for acute  emergent pathology which would require inpatient management or further work-up in emergency department at this time.  Opportunity for questions provided.  All questions answered.       Nettie Elm, PA-C 06/20/18 1014    Jola Schmidt, MD 06/20/18 1045

## 2018-06-22 NOTE — ED Provider Notes (Signed)
.  Epistaxis Management Date/Time: 06/22/2018 6:58 PM Performed by: Duffy Bruce, MD Authorized by: Duffy Bruce, MD   Consent:    Consent obtained:  Verbal   Consent given by:  Patient   Risks discussed:  Infection, bleeding, nasal injury and pain   Alternatives discussed:  Alternative treatment, observation and referral Anesthesia (see MAR for exact dosages):    Anesthesia method:  Topical application   Topical anesthetic:  Lidocaine gel Procedure details:    Treatment site:  R posterior   Treatment method:  Nasal balloon   Treatment complexity:  Limited   Treatment episode: initial   Post-procedure details:    Assessment:  Bleeding stopped   Patient tolerance of procedure:  Tolerated well, no immediate complications      Duffy Bruce, MD 06/22/18 1858

## 2018-06-24 DIAGNOSIS — H35371 Puckering of macula, right eye: Secondary | ICD-10-CM | POA: Diagnosis not present

## 2018-06-24 DIAGNOSIS — R040 Epistaxis: Secondary | ICD-10-CM | POA: Diagnosis not present

## 2018-06-25 ENCOUNTER — Other Ambulatory Visit: Payer: Self-pay | Admitting: Cardiovascular Disease

## 2018-07-03 HISTORY — PX: SKIN CANCER EXCISION: SHX779

## 2018-07-05 DIAGNOSIS — G4733 Obstructive sleep apnea (adult) (pediatric): Secondary | ICD-10-CM | POA: Diagnosis not present

## 2018-07-09 ENCOUNTER — Other Ambulatory Visit: Payer: Self-pay | Admitting: Nurse Practitioner

## 2018-07-10 DIAGNOSIS — R350 Frequency of micturition: Secondary | ICD-10-CM | POA: Diagnosis not present

## 2018-08-01 ENCOUNTER — Other Ambulatory Visit: Payer: Self-pay

## 2018-08-01 ENCOUNTER — Ambulatory Visit (INDEPENDENT_AMBULATORY_CARE_PROVIDER_SITE_OTHER): Payer: Medicare Other | Admitting: Nurse Practitioner

## 2018-08-01 ENCOUNTER — Ambulatory Visit (INDEPENDENT_AMBULATORY_CARE_PROVIDER_SITE_OTHER): Payer: Medicare Other

## 2018-08-01 VITALS — Ht 70.0 in | Wt 197.0 lb

## 2018-08-01 DIAGNOSIS — R972 Elevated prostate specific antigen [PSA]: Secondary | ICD-10-CM | POA: Diagnosis not present

## 2018-08-01 DIAGNOSIS — E782 Mixed hyperlipidemia: Secondary | ICD-10-CM

## 2018-08-01 DIAGNOSIS — Z0000 Encounter for general adult medical examination without abnormal findings: Secondary | ICD-10-CM | POA: Diagnosis not present

## 2018-08-01 DIAGNOSIS — I10 Essential (primary) hypertension: Secondary | ICD-10-CM | POA: Diagnosis not present

## 2018-08-01 NOTE — Progress Notes (Signed)
Subjective:   Mario Palmer is a 78 y.o. male who presents for Medicare Annual/Subsequent preventive examination.  This visit type was conducted due to national recommendations for restrictions regarding the COVID-19 Pandemic (e.g. social distancing). This format is felt to be most appropriate for this patient at this time. All issues noted in this document were discussed and addressed. No physical exam was performed (except for noted visual exam findings with Video Visits). The patient,Mario Palmer, has given consent to perform this visit via video.During visit patient was unable to hear nurse and had to turn to telephone for audio. Vital signs may be absent or patient reported.   Patient location: at home   Nurse location:  [place] office  Review of Systems:  n/a Cardiac Risk Factors include: advanced age (>56men, >78 women);dyslipidemia;male gender;hypertension     Objective:    Vitals: Ht 5\' 10"  (1.778 m) Comment: per patient report  Wt 197 lb (89.4 kg) Comment: per patient report  BMI 28.27 kg/m   Body mass index is 28.27 kg/m.  Advanced Directives 08/01/2018 06/20/2018  Does Patient Have a Medical Advance Directive? Yes No  Type of Advance Directive [place] -  Does patient want to make changes to medical advance directive? No - Patient declined -  Copy of [place] in Chart? No - copy requested -  Would patient like information on creating a medical advance directive? - No - Patient declined    Tobacco Social History   Tobacco Use  Smoking Status Former Smoker  . Types: Cigarettes  Smokeless Tobacco Never Used  Tobacco Comment   quit smoking in 1982     Counseling given: Not Answered Comment: quit smoking in 1982   Clinical Intake:  Pre-visit preparation completed: Yes  Pain : No/denies pain     Nutritional Status: BMI 25 -29 Overweight Nutritional Risks: None Diabetes: No  How often do you need to  have someone help you when you read instructions, pamphlets, or other written materials from your doctor or pharmacy?: 1 - Never What is the last grade level you completed in school?: 12th grade  Interpreter Needed?: No  Information entered by :: NAllen LPN  Past Medical History:  Diagnosis Date  . GERD (gastroesophageal reflux disease)   . High cholesterol   . Hx of echocardiogram 12/2010   EF 55%, There is Stage 1 diastolic dyfunction. Normal LV filling pressure. Aortic sclerosis with mild AI, mild TR  . Hypertension   . Palpitation    with ecotopy  . Sleep apnea    Past Surgical History:  Procedure Laterality Date  . CARDIOVERSION N/A 09/12/2016   Procedure: CARDIOVERSION;  Surgeon: Troy Sine, MD;  Location: [facility] ENDOSCOPY;  Service: Cardiovascular;  Laterality: N/A;  . MOHS SURGERY Left    skin cancer remover from left side  . RETINAL DETACHMENT SURGERY Right    History reviewed. No pertinent family history. Social History   Socioeconomic History  . Marital status: Married    Spouse name: Not on file  . Number of children: Not on file  . Years of education: Not on file  . Highest education level: Not on file  Occupational History  . Occupation: retired  Scientific laboratory technician  . Financial resource strain: Not hard at all  . Food insecurity:    Worry: Never true    Inability: Never true  . Transportation needs:    Medical: No    Non-medical: Not on file  Tobacco Use  . Smoking status: Former Smoker    Types: Cigarettes  . Smokeless tobacco: Never Used  . Tobacco comment: quit smoking in 1982  Substance and Sexual Activity  . Alcohol use: No  . Drug use: No  . Sexual activity: Not Currently  Lifestyle  . Physical activity:    Days per week: 7 days    Minutes per session: 30 min  . Stress: Not at all  Relationships  . Social connections:    Talks on phone: Not on file    Gets together: Not on file    Attends religious service: Not on file    Active member of  club or organization: Not on file    Attends meetings of clubs or organizations: Not on file    Relationship status: Not on file  Other Topics Concern  . Not on file  Social History Narrative  . Not on file    Outpatient Encounter Medications as of 08/01/2018  Medication Sig  . amiodarone (PACERONE) 200 MG tablet TAKE TWO TABLETS BY MOUTH DAILY   . apixaban (ELIQUIS) 5 MG TABS tablet Take 1 tablet (5 mg total) by mouth 2 (two) times daily.  Marland Kitchen atorvastatin (LIPITOR) 20 MG tablet Take 1 tablet (20 mg total) by mouth daily.  Marland Kitchen BYSTOLIC 10 MG tablet TAKE ONE TABLET BY MOUTH ONE TIME DAILY  (Patient taking differently: Take 5 mg by mouth daily. )  . Cholecalciferol (VITAMIN D) 2000 UNITS tablet Take 2,000 Units by mouth daily.  Marland Kitchen diltiazem (CARDIZEM CD) 120 MG 24 hr capsule Take 1 capsule (120 mg total) by mouth at bedtime.  Marland Kitchen lisinopril (PRINIVIL,ZESTRIL) 40 MG tablet TAKE ONE TABLET BY MOUTH ONE TIME DAILY   . loratadine (CLARITIN) 10 MG tablet Take 1 tablet (10 mg total) by mouth daily.  . Multiple Vitamins-Minerals (MULTIVITAMIN ADULT PO) Take 1 tablet by mouth daily.  . NON FORMULARY CPAP therapy  . Saccharomyces boulardii (PROBIOTIC) 250 MG CAPS Take 1 capsule by mouth daily.  Marland Kitchen spironolactone (ALDACTONE) 25 MG tablet TAKE ONE TABLET BY MOUTH ONE TIME DAILY   . Vitamin E 400 units TABS Take 1 tablet by mouth daily.  . fluticasone (FLONASE) 50 MCG/ACT nasal spray PLACE 1 SPRAY IN EACH NOSTRIL ONCE A DAY (Patient not taking: No sig reported)  . tamsulosin (FLOMAX) 0.4 MG CAPS capsule Take 0.4 mg by mouth daily.   No facility-administered encounter medications on file as of 08/01/2018.     Activities of Daily Living In your present state of health, do you have any difficulty performing the following activities: 08/01/2018  Hearing? Y  Comment wears hearing aides  Vision? N  Difficulty concentrating or making decisions? N  Walking or climbing stairs? N  Dressing or bathing? N  Doing  errands, shopping? N  Preparing Food and eating ? N  Using the Toilet? N  In the past six months, have you accidently leaked urine? N  Do you have problems with loss of bowel control? N  Managing your Medications? N  Managing your Finances? N  Housekeeping or managing your Housekeeping? N  Some recent data might be hidden    Patient Care Team: [name], FNP as PCP - General (General Practice) Troy Sine, MD as PCP - Cardiology (Cardiology) Zadie Rhine Clent Demark, MD as Consulting Physician (Ophthalmology)   Assessment:   This is a routine wellness examination for [place].  Exercise Activities and Dietary recommendations Current Exercise Habits: Home exercise routine, Type of exercise:  walking;Other - see comments(stationary bike), Time (Minutes): 30, Frequency (Times/Week): 7, Weekly Exercise (Minutes/Week): 210, Intensity: Moderate  Goals    . Patient Stated     Wants to be able to move like 20 years ago       Fall Risk Fall Risk  08/01/2018 05/20/2018 03/15/2018  Falls in the past year? 0 0 0  Risk for fall due to : Medication side effect - -  Follow up Falls prevention discussed - -   Is the patient's home free of loose throw rugs in walkways, pet beds, electrical cords, etc?   yes      Grab bars in the bathroom? no      Handrails on the stairs?   yes      Adequate lighting?   yes  Timed Get Up and Go Performed: n/a  Depression Screen PHQ 2/9 Scores 08/01/2018 05/20/2018 03/15/2018  PHQ - 2 Score 0 0 0  PHQ- 9 Score 0 - -    Cognitive Function     6CIT Screen 08/01/2018  What Year? 0 points  What month? 0 points  What time? 0 points  Count back from 20 0 points  Months in reverse 0 points  Repeat phrase 0 points  Total Score 0    Immunization History  Administered Date(s) Administered  . Tdap 06/03/2011    Qualifies for Shingles Vaccine? yes  Screening Tests Health Maintenance  Topic Date Due  . PNA vac Low Risk Adult (1 of 2 - PCV13) 08/01/2019  (Originally 12/25/2003)  . INFLUENZA VACCINE  11/02/2018  . TETANUS/TDAP  06/02/2021   Cancer Screenings: Lung: Low Dose CT Chest recommended if Age 47-80 years, 30 pack-year currently smoking OR have quit w/in 15years. Patient does not qualify. Colorectal: up to date  Additional Screenings:  Hepatitis C Screening:n/a      Plan:   Declines vaccinations.   I have personally reviewed and noted the following in the patient's chart:   . Medical and social history . Use of alcohol, tobacco or illicit drugs  . Current medications and supplements . Functional ability and status . Nutritional status . Physical activity . Advanced directives . List of other physicians . Hospitalizations, surgeries, and ER visits in previous 12 months . Vitals . Screenings to include cognitive, depression, and falls . Referrals and appointments  In addition, I have reviewed and discussed with patient certain preventive protocols, quality metrics, and best practice recommendations. A written personalized care plan for preventive services as well as general preventive health recommendations were provided to patient.     Kellie Simmering, LPN  8/84/1660

## 2018-08-01 NOTE — Progress Notes (Signed)
Virtual Visit via telephone     This visit type was conducted due to national recommendations for restrictions regarding the COVID-19 Pandemic (e.g. social distancing) in an effort to limit this patient's exposure and mitigate transmission in our community.  Patients identity confirmed using two different identifiers.  This format is felt to be most appropriate for this patient at this time.  All issues noted in this document were discussed and addressed.  No physical exam was performed (except for noted visual exam findings with Video Visits).    Date:  08/26/2018   ID:  SARGENT MANKEY, DOB [birth year], MRN [id number]  Patient Location:  [place]  Provider location:   Office    Chief Complaint:  HTN, cholesterol follow up   History of Present Illness:    Mario Palmer is a 78 y.o. male who presents via video conferencing for a telehealth visit today.    The patient does not have symptoms concerning for COVID-19 infection (fever, chills, cough, or new shortness of breath).   Hypertension  This is a chronic problem. The current episode started more than 1 year ago. The problem is unchanged. The problem is controlled. Pertinent negatives include no anxiety. There are no associated agents to hypertension.     Past Medical History:  Diagnosis Date  . GERD (gastroesophageal reflux disease)   . High cholesterol   . Hx of echocardiogram 12/2010   EF 55%, There is Stage 1 diastolic dyfunction. Normal LV filling pressure. Aortic sclerosis with mild AI, mild TR  . Hypertension   . Palpitation    with ecotopy  . Sleep apnea    Past Surgical History:  Procedure Laterality Date  . CARDIOVERSION N/A 09/12/2016   Procedure: CARDIOVERSION;  Surgeon: Troy Sine, MD;  Location: [facility] ENDOSCOPY;  Service: Cardiovascular;  Laterality: N/A;  . MOHS SURGERY Left    skin cancer remover from left side  . RETINAL DETACHMENT SURGERY Right      No outpatient medications  have been marked as taking for the 08/01/18 encounter (Office Visit) with [name], [place].     Allergies:   Patient has no known allergies.   Social History   Tobacco Use  . Smoking status: Former Smoker    Types: Cigarettes  . Smokeless tobacco: Never Used  . Tobacco comment: quit smoking in 1982  Substance Use Topics  . Alcohol use: No  . Drug use: No     Family Hx: The patient's family history is not on file.  ROS:   Please see the history of present illness.    Review of Systems  Constitutional: Negative.   Respiratory: Negative.   Cardiovascular: Negative.   Neurological: Negative for dizziness and tingling.    All other systems reviewed and are negative.   Labs/Other Tests and Data Reviewed:    Recent Labs: 03/15/2018: ALT 26 06/20/2018: BUN 29; Creatinine, Ser 1.57; Hemoglobin 11.0; Platelets 280; Potassium 4.4; Sodium 134   Recent Lipid Panel Lab Results  Component Value Date/Time   CHOL 178 03/15/2018 09:03 AM   TRIG 128 03/15/2018 09:03 AM   HDL 63 03/15/2018 09:03 AM   CHOLHDL 2.8 03/15/2018 09:03 AM   CHOLHDL 3.0 06/07/2016 10:30 AM   LDLCALC 89 03/15/2018 09:03 AM    Wt Readings from Last 3 Encounters:  08/01/18 197 lb (89.4 kg)  08/01/18 197 lb (89.4 kg)  06/20/18 197 lb 6.4 oz (89.5 kg)     Exam:    Vital  Signs:  Ht 5\' 10"  (1.778 m)   Wt 197 lb (89.4 kg)   BMI 28.27 kg/m     Physical Exam  Constitutional: He is oriented to person, place, and time.  Neurological: He is alert and oriented to person, place, and time.  Psychiatric: Mood, memory, affect and judgment normal.  able to answer questions  Telephone   ASSESSMENT & PLAN:    1. Essential hypertension B/P is controlled.  No labs this visit  The importance of regular exercise and dietary modification was stressed to the patient.   2. Mixed hyperlipidemia  Chronic, controlled  Continue with current medications  3. Elevated PSA  Chronic  He did go to the urologist  and no abnormal findings other than elevated PSA he will continue follow up as instructed    COVID-19 Education: The signs and symptoms of COVID-19 were discussed with the patient and how to seek care for testing (follow up with PCP or arrange E-visit).  The importance of social distancing was discussed today.  Patient Risk:   After full review of this patients clinical status, I feel that they are at least moderate risk at this time.  Time:   Today, I have spent 11 minutes/ seconds with the patient with telehealth technology discussing above diagnoses.     Medication Adjustments/Labs and Tests Ordered: Current medicines are reviewed at length with the patient today.  Concerns regarding medicines are outlined above.   Tests Ordered: No orders of the defined types were placed in this encounter.   Medication Changes: No orders of the defined types were placed in this encounter.   Disposition:  Follow up in 4 month(s)  Signed, [name], FNP

## 2018-08-01 NOTE — Patient Instructions (Signed)
[name] , Thank you for taking time to come for your Medicare Wellness Visit. I appreciate your ongoing commitment to your health goals. Please review the following plan we discussed and let me know if I can assist you in the future.   Screening recommendations/referrals: Colonoscopy: 06/2017 Recommended yearly ophthalmology/optometry visit for glaucoma screening and checkup Recommended yearly dental visit for hygiene and checkup  Vaccinations: Influenza vaccine: declines Pneumococcal vaccine: declines Tdap vaccine: 06/2011 Shingles vaccine: declines    Advanced directives: Please bring a copy of your POA (Power of Attorney) and/or Living Will to your next appointment.    Conditions/risks identified: Overweight  Next appointment: 08/01/2018 at 11:15  Preventive Care 80 Years and Older, Male Preventive care refers to lifestyle choices and visits with your health care provider that can promote health and wellness. What does preventive care include?  A yearly physical exam. This is also called an annual well check.  Dental exams once or twice a year.  Routine eye exams. Ask your health care provider how often you should have your eyes checked.  Personal lifestyle choices, including:  Daily care of your teeth and gums.  Regular physical activity.  Eating a healthy diet.  Avoiding tobacco and drug use.  Limiting alcohol use.  Practicing safe sex.  Taking low doses of aspirin every day.  Taking vitamin and mineral supplements as recommended by your health care provider. What happens during an annual well check? The services and screenings done by your health care provider during your annual well check will depend on your age, overall health, lifestyle risk factors, and family history of disease. Counseling  Your health care provider may ask you questions about your:  Alcohol use.  Tobacco use.  Drug use.  Emotional well-being.  Home and relationship  well-being.  Sexual activity.  Eating habits.  History of falls.  Memory and ability to understand (cognition).  Work and work Statistician. Screening  You may have the following tests or measurements:  Height, weight, and BMI.  Blood pressure.  Lipid and cholesterol levels. These may be checked every 5 years, or more frequently if you are over 10 years old.  Skin check.  Lung cancer screening. You may have this screening every year starting at age 31 if you have a 30-pack-year history of smoking and currently smoke or have quit within the past 15 years.  Fecal occult blood test (FOBT) of the stool. You may have this test every year starting at age 85.  Flexible sigmoidoscopy or colonoscopy. You may have a sigmoidoscopy every 5 years or a colonoscopy every 10 years starting at age 39.  Prostate cancer screening. Recommendations will vary depending on your family history and other risks.  Hepatitis C blood test.  Hepatitis B blood test.  Sexually transmitted disease (STD) testing.  Diabetes screening. This is done by checking your blood sugar (glucose) after you have not eaten for a while (fasting). You may have this done every 1-3 years.  Abdominal aortic aneurysm (AAA) screening. You may need this if you are a current or former smoker.  Osteoporosis. You may be screened starting at age 28 if you are at high risk. Talk with your health care provider about your test results, treatment options, and if necessary, the need for more tests. Vaccines  Your health care provider may recommend certain vaccines, such as:  Influenza vaccine. This is recommended every year.  Tetanus, diphtheria, and acellular pertussis (Tdap, Td) vaccine. You may need a Td booster every  10 years.  Zoster vaccine. You may need this after age 70.  Pneumococcal 13-valent conjugate (PCV13) vaccine. One dose is recommended after age 67.  Pneumococcal polysaccharide (PPSV23) vaccine. One dose is  recommended after age 6. Talk to your health care provider about which screenings and vaccines you need and how often you need them. This information is not intended to replace advice given to you by your health care provider. Make sure you discuss any questions you have with your health care provider. Document Released: 04/16/2015 Document Revised: 12/08/2015 Document Reviewed: 01/19/2015 Elsevier Interactive Patient Education  2017 [place] Prevention in the Home Falls can cause injuries. They can happen to people of all ages. There are many things you can do to make your home safe and to help prevent falls. What can I do on the outside of my home?  Regularly fix the edges of walkways and driveways and fix any cracks.  Remove anything that might make you trip as you walk through a door, such as a raised step or threshold.  Trim any bushes or trees on the path to your home.  Use bright outdoor lighting.  Clear any walking paths of anything that might make someone trip, such as rocks or tools.  Regularly check to see if handrails are loose or broken. Make sure that both sides of any steps have handrails.  Any raised decks and porches should have guardrails on the edges.  Have any leaves, snow, or ice cleared regularly.  Use sand or salt on walking paths during winter.  Clean up any spills in your garage right away. This includes oil or grease spills. What can I do in the bathroom?  Use night lights.  Install grab bars by the toilet and in the tub and shower. Do not use towel bars as grab bars.  Use non-skid mats or decals in the tub or shower.  If you need to sit down in the shower, use a plastic, non-slip stool.  Keep the floor dry. Clean up any water that spills on the floor as soon as it happens.  Remove soap buildup in the tub or shower regularly.  Attach bath mats securely with double-sided non-slip rug tape.  Do not have throw rugs and other things on  the floor that can make you trip. What can I do in the bedroom?  Use night lights.  Make sure that you have a light by your bed that is easy to reach.  Do not use any sheets or blankets that are too big for your bed. They should not hang down onto the floor.  Have a firm chair that has side arms. You can use this for support while you get dressed.  Do not have throw rugs and other things on the floor that can make you trip. What can I do in the kitchen?  Clean up any spills right away.  Avoid walking on wet floors.  Keep items that you use a lot in easy-to-reach places.  If you need to reach something above you, use a strong step stool that has a grab bar.  Keep electrical cords out of the way.  Do not use floor polish or wax that makes floors slippery. If you must use wax, use non-skid floor wax.  Do not have throw rugs and other things on the floor that can make you trip. What can I do with my stairs?  Do not leave any items on the stairs.  Make sure  that there are handrails on both sides of the stairs and use them. Fix handrails that are broken or loose. Make sure that handrails are as long as the stairways.  Check any carpeting to make sure that it is firmly attached to the stairs. Fix any carpet that is loose or worn.  Avoid having throw rugs at the top or bottom of the stairs. If you do have throw rugs, attach them to the floor with carpet tape.  Make sure that you have a light switch at the top of the stairs and the bottom of the stairs. If you do not have them, ask someone to add them for you. What else can I do to help prevent falls?  Wear shoes that:  Do not have high heels.  Have rubber bottoms.  Are comfortable and fit you well.  Are closed at the toe. Do not wear sandals.  If you use a stepladder:  Make sure that it is fully opened. Do not climb a closed stepladder.  Make sure that both sides of the stepladder are locked into place.  Ask someone to  hold it for you, if possible.  Clearly mark and make sure that you can see:  Any grab bars or handrails.  First and last steps.  Where the edge of each step is.  Use tools that help you move around (mobility aids) if they are needed. These include:  Canes.  Walkers.  Scooters.  Crutches.  Turn on the lights when you go into a dark area. Replace any light bulbs as soon as they burn out.  Set up your furniture so you have a clear path. Avoid moving your furniture around.  If any of your floors are uneven, fix them.  If there are any pets around you, be aware of where they are.  Review your medicines with your doctor. Some medicines can make you feel dizzy. This can increase your chance of falling. Ask your doctor what other things that you can do to help prevent falls. This information is not intended to replace advice given to you by your health care provider. Make sure you discuss any questions you have with your health care provider. Document Released: 01/14/2009 Document Revised: 08/26/2015 Document Reviewed: 04/24/2014 Elsevier Interactive Patient Education  2017 [employer].

## 2018-08-14 DIAGNOSIS — R350 Frequency of micturition: Secondary | ICD-10-CM | POA: Diagnosis not present

## 2018-08-18 ENCOUNTER — Other Ambulatory Visit: Payer: Self-pay | Admitting: Cardiovascular Disease

## 2018-08-26 ENCOUNTER — Encounter: Payer: Self-pay | Admitting: Nurse Practitioner

## 2018-08-29 ENCOUNTER — Ambulatory Visit: Payer: Medicare Other | Admitting: Nurse Practitioner

## 2018-08-29 ENCOUNTER — Ambulatory Visit: Payer: Medicare Other

## 2018-08-30 ENCOUNTER — Ambulatory Visit: Payer: Medicare Other | Admitting: Nurse Practitioner

## 2018-09-30 DIAGNOSIS — G4733 Obstructive sleep apnea (adult) (pediatric): Secondary | ICD-10-CM | POA: Diagnosis not present

## 2018-10-09 ENCOUNTER — Other Ambulatory Visit: Payer: Self-pay | Admitting: Internal Medicine

## 2018-10-21 ENCOUNTER — Other Ambulatory Visit: Payer: Self-pay | Admitting: Surgery

## 2018-10-21 DIAGNOSIS — K4090 Unilateral inguinal hernia, without obstruction or gangrene, not specified as recurrent: Secondary | ICD-10-CM | POA: Diagnosis not present

## 2018-10-21 DIAGNOSIS — K429 Umbilical hernia without obstruction or gangrene: Secondary | ICD-10-CM | POA: Diagnosis not present

## 2018-10-22 ENCOUNTER — Telehealth: Payer: Self-pay

## 2018-10-22 NOTE — Telephone Encounter (Signed)
   Primary Cardiologist: [name], MD  Chart reviewed as part of pre-operative protocol coverage. Patient was contacted 10/22/2018 in reference to pre-operative risk assessment for pending surgery as outlined below.  Mario Palmer was last seen on 06/17/2018 by Dr. Claiborne Billings.    Patient with diagnosis of Afib on Eliquis for anticoagulation.    Procedure: LEFT INGUINAL HERNIA Date of procedure: TBD  CHADS2-VASc score of 3  (HTN, AGE,  AGE)  CrCl 39 ml/min  Per office protocol and pharmacy recommendations, patient can hold Elqiuis for 2 days prior to procedure.    Therefore, based on ACC/AHA guidelines, the patient would be at acceptable risk for the planned procedure without further cardiovascular testing.   I will route this recommendation to the requesting party via Epic fax function and remove from pre-op pool.  Please call with questions.  Kathyrn Drown, NP 10/22/2018, 11:13 AM

## 2018-10-22 NOTE — Telephone Encounter (Signed)
   [facility] Pre-operative Risk Assessment    Request for surgical clearance:  1. What type of surgery is being performed? LEFT INGUINAL HERNIA    2. When is this surgery scheduled? TBD   3. What type of clearance is required (medical clearance vs. Pharmacy clearance to hold med vs. Both)? BOTH  4. Are there any medications that need to be held prior to surgery and how long? ELIQUIS   5. Practice name and name of physician performing surgery?  [facility] ATTN:ARMEN   6. What is your office phone number [phone]    7.   What is your office fax number [phone]  8.   Anesthesia type (None, local, MAC, general) ? GENERAL   Mario Palmer 10/22/2018, 7:32 AM  _________________________________________________________________   (provider comments below)

## 2018-10-22 NOTE — Telephone Encounter (Signed)
Patient with diagnosis of Afib on Eliquis for anticoagulation.    Procedure: LEFT INGUINAL HERNIA    Date of procedure: TBD  CHADS2-VASc score of 3  (HTN, AGE,  AGE)  CrCl 39 ml/min  Per office protocol, patient can hold Elqiuis for 2 days prior to procedure.

## 2018-10-23 DIAGNOSIS — G4733 Obstructive sleep apnea (adult) (pediatric): Secondary | ICD-10-CM | POA: Diagnosis not present

## 2018-11-13 DIAGNOSIS — R350 Frequency of micturition: Secondary | ICD-10-CM | POA: Diagnosis not present

## 2018-11-21 ENCOUNTER — Other Ambulatory Visit: Payer: Self-pay

## 2018-11-21 ENCOUNTER — Encounter (HOSPITAL_COMMUNITY): Payer: Self-pay

## 2018-11-21 ENCOUNTER — Encounter (HOSPITAL_COMMUNITY)
Admission: RE | Admit: 2018-11-21 | Discharge: 2018-11-21 | Disposition: A | Discharge status: Home / Self Care | Payer: Medicare Other | Source: Ambulatory Visit | Attending: Surgery | Admitting: Surgery

## 2018-11-21 DIAGNOSIS — Z87891 Personal history of nicotine dependence: Secondary | ICD-10-CM | POA: Diagnosis not present

## 2018-11-21 DIAGNOSIS — K4090 Unilateral inguinal hernia, without obstruction or gangrene, not specified as recurrent: Secondary | ICD-10-CM | POA: Diagnosis not present

## 2018-11-21 DIAGNOSIS — K219 Gastro-esophageal reflux disease without esophagitis: Secondary | ICD-10-CM | POA: Diagnosis not present

## 2018-11-21 DIAGNOSIS — Z79899 Other long term (current) drug therapy: Secondary | ICD-10-CM | POA: Insufficient documentation

## 2018-11-21 DIAGNOSIS — I4891 Unspecified atrial fibrillation: Secondary | ICD-10-CM | POA: Insufficient documentation

## 2018-11-21 DIAGNOSIS — Z85828 Personal history of other malignant neoplasm of skin: Secondary | ICD-10-CM | POA: Insufficient documentation

## 2018-11-21 DIAGNOSIS — G4733 Obstructive sleep apnea (adult) (pediatric): Secondary | ICD-10-CM | POA: Diagnosis not present

## 2018-11-21 DIAGNOSIS — I10 Essential (primary) hypertension: Secondary | ICD-10-CM | POA: Diagnosis not present

## 2018-11-21 DIAGNOSIS — Z87442 Personal history of urinary calculi: Secondary | ICD-10-CM | POA: Insufficient documentation

## 2018-11-21 DIAGNOSIS — Z7901 Long term (current) use of anticoagulants: Secondary | ICD-10-CM | POA: Insufficient documentation

## 2018-11-21 DIAGNOSIS — K429 Umbilical hernia without obstruction or gangrene: Secondary | ICD-10-CM | POA: Diagnosis not present

## 2018-11-21 DIAGNOSIS — E7800 Pure hypercholesterolemia, unspecified: Secondary | ICD-10-CM | POA: Insufficient documentation

## 2018-11-21 DIAGNOSIS — Z01818 Encounter for other preprocedural examination: Secondary | ICD-10-CM | POA: Insufficient documentation

## 2018-11-21 HISTORY — DX: Unspecified osteoarthritis, unspecified site: M19.90

## 2018-11-21 HISTORY — DX: Malignant (primary) neoplasm, unspecified: C80.1

## 2018-11-21 HISTORY — DX: Personal history of urinary calculi: Z87.442

## 2018-11-21 HISTORY — DX: Cardiac arrhythmia, unspecified: I49.9

## 2018-11-21 LAB — CBC
HCT: 39.9 % (ref 39.0–52.0)
Hemoglobin: 13.1 g/dL (ref 13.0–17.0)
MCH: 31 pg (ref 26.0–34.0)
MCHC: 32.8 g/dL (ref 30.0–36.0)
MCV: 94.5 fL (ref 80.0–100.0)
Platelets: 219 10*3/uL (ref 150–400)
RBC: 4.22 MIL/uL (ref 4.22–5.81)
RDW: 13.4 % (ref 11.5–15.5)
WBC: 7.4 10*3/uL (ref 4.0–10.5)
nRBC: 0 % (ref 0.0–0.2)

## 2018-11-21 LAB — BASIC METABOLIC PANEL
Anion gap: 9 (ref 5–15)
BUN: 30 mg/dL — ABNORMAL HIGH (ref 8–23)
CO2: 24 mmol/L (ref 22–32)
Calcium: 9.4 mg/dL (ref 8.9–10.3)
Chloride: 107 mmol/L (ref 98–111)
Creatinine, Ser: 1.96 mg/dL — ABNORMAL HIGH (ref 0.61–1.24)
GFR calc Af Amer: 37 mL/min — ABNORMAL LOW (ref >60)
GFR calc non Af Amer: 32 mL/min — ABNORMAL LOW (ref >60)
Glucose, Bld: 104 mg/dL — ABNORMAL HIGH (ref 70–99)
Potassium: 5.2 mmol/L — ABNORMAL HIGH (ref 3.5–5.1)
Sodium: 140 mmol/L (ref 135–145)

## 2018-11-21 NOTE — Progress Notes (Signed)
[id number] [place], [place] - [address] [place] [address] Phone: [phone] Fax: [place] [address], [place] [address] Phone: [phone] Fax: [phone]    Your procedure is scheduled on Thursday, August 27th.  Report to [facility] Main Entrance "A" at 7:00 A.M., and check in at the Admitting office.  Call this number if you have problems the morning of surgery:  [phone]  Call [phone] if you have any questions prior to your surgery date Monday-Friday 8am-4pm   Remember:  Do not eat after midnight the night before your surgery  You may drink clear liquids until 6:00 the morning of your surgery.   Clear liquids allowed are: Water, Non-Citrus Juices (without pulp), Carbonated Beverages, Clear Tea, Black Coffee Only, and Gatorade    Take these medicines the morning of surgery with A SIP OF WATER  amiodarone (PACERONE) atorvastatin (LIPITOR) BYSTOLIC  tamsulosin (FLOMAX)   If needed - sodium chloride (OCEAN)-nasal spray,   7 days prior to surgery STOP taking any Aspirin (unless otherwise instructed by your surgeon), Aleve, Naproxen, Ibuprofen, Motrin, Advil, Goody's, BC's, all herbal medications, fish oil, and all vitamins.  Follow your surgeon's instructions on when to stop ELIQUIS.  If no instructions were given by your surgeon then you will need to call the office to get those instructions.    The Morning of Surgery  Do not wear jewelry, make-up or nail polish.  Do not wear lotions, powders, or perfumes/colognes, or deodorant  Do not shave 48 hours prior to surgery.  Men may shave face and neck.  Do not bring valuables to the hospital.  [facility] is not responsible for any belongings or valuables.  If you are a smoker, DO NOT Smoke 24 hours prior to surgery IF you wear a CPAP at night please bring your  mask, tubing, and machine the morning of surgery   Remember that you must have someone to transport you home after your surgery, and remain with you for 24 hours if you are discharged the same day.  Contacts, glasses, hearing aids, dentures or bridgework may not be worn into surgery.   Leave your suitcase in the car.  After surgery it may be brought to your room.  For patients admitted to the hospital, discharge time will be determined by your treatment team.  Patients discharged the day of surgery will not be allowed to drive home.   Special instructions:   [facility]- Preparing For Surgery  Before surgery, you can play an important role. Because skin is not sterile, your skin needs to be as free of germs as possible. You can reduce the number of germs on your skin by washing with CHG (chlorahexidine gluconate) Soap before surgery.  CHG is an antiseptic cleaner which kills germs and bonds with the skin to continue killing germs even after washing.    Oral Hygiene is also important to reduce your risk of infection.  Remember - BRUSH YOUR TEETH THE MORNING OF SURGERY WITH YOUR REGULAR TOOTHPASTE  Please do not use if you have an allergy to CHG or antibacterial soaps. If your skin becomes reddened/irritated stop using the CHG.  Do not shave (including legs and underarms) for at least 48 hours prior to first CHG shower. It is OK to shave your face.  Please follow these instructions carefully.   1. Shower the  NIGHT BEFORE SURGERY and the MORNING OF SURGERY with CHG Soap.   2. If you chose to wash your hair, wash your hair first as usual with your normal shampoo.  3. After you shampoo, rinse your hair and body thoroughly to remove the shampoo.  4. Use CHG as you would any other liquid soap. You can apply CHG directly to the skin and wash gently with a scrungie or a clean washcloth.   5. Apply the CHG Soap to your body ONLY FROM THE NECK DOWN.  Do not use on open wounds or open sores.  Avoid contact with your eyes, ears, mouth and genitals (private parts). Wash Face and genitals (private parts)  with your normal soap.   6. Wash thoroughly, paying special attention to the area where your surgery will be performed.  7. Thoroughly rinse your body with warm water from the neck down.  8. DO NOT shower/wash with your normal soap after using and rinsing off the CHG Soap.  9. Pat yourself dry with a CLEAN TOWEL.  10. Wear CLEAN PAJAMAS to bed the night before surgery, wear comfortable clothes the morning of surgery  11. Place CLEAN SHEETS on your bed the night of your first shower and DO NOT SLEEP WITH PETS.  Day of Surgery:  Do not apply any deodorants/lotions. Please shower the morning of surgery with the CHG soap  Please wear clean clothes to the hospital/surgery center.   Remember to brush your teeth WITH YOUR REGULAR TOOTHPASTE.  Please read over the following fact sheets that you were given.

## 2018-11-21 NOTE — Progress Notes (Addendum)
PCP:  Dr. [name] Cardiologist:  Dr. [name]  EKG:  06/20/18 CXR:  denies ECHO:  06/23/16 Stress Test:  denies Cardiac Cath:Denies  Sleep Study:  Yes, 2010 CPAP:  Yes  Anesthesia Review:  Yes, heart history and cardioversions.  Last CV was 2-3 years ago.  Patient advised to stop Eliquis 2 days prior to surgery.  Pt takes for Afib.  Has had cardioversion.   Patient denies shortness of breath, fever, cough, and chest pain at PAT appointment.  Patient verbalized understanding of instructions provided today at the PAT appointment.  Patient asked to review instructions at home and day of surgery.

## 2018-11-22 NOTE — Progress Notes (Addendum)
Anesthesia Chart Review:  Case: [id number] Date/Time: 11/28/18 0845   Procedures:      LAPAROSCOPIC  LEFT INGUINAL HERNIA WITH MESH, POSSIBLE RIGHT (Left )     OPEN UMBILICAL HERNIA REPAIR WITH MESH (N/A )   Anesthesia type: General   Pre-op diagnosis: left inguinal hernia, umbilical hernia   Location: [facility] OR ROOM 09 / [facility]   Surgeon: Coralie Keens, MD      DISCUSSION: Patient is a 78 year old male scheduled for the above procedure.  History includes former smoker, HTN, hypercholesterolemia, GERD, OSA (CPAP), palpitations, afib/flutter (s/p DCCV 09/12/16), skin cancer. ED evaluation for dizziness and right nares epistaxis requiring packing and ENT follow-up (Dr. Lucia Gaskins). Head CT showed no acute findings.  Preoperative cardiology input outlined by Kathyrn Drown, NP on 10/22/18: "Per office protocol and pharmacy recommendations, patient can holdElqiuisfor 2days prior to procedure.   Therefore, based on ACC/AHA guidelines, the patient would be at acceptable risk for the planned procedure without further cardiovascular testing."   Creatinine over the past 8 months has been elevated (1.32-1.57) and up to 1.96 on 11/21/18 with preoperative labs. K+ also mildly elevated at 5.2 (no mention of hemolysis). Lab Results  Component Value Date   CREATININE 1.96 (H) 11/21/2018   CREATININE 1.57 (H) 06/20/2018   CREATININE 1.32 (H) 05/20/2018   - Meds include Bystolic, diltiazem, lisinopril and spironolactone for HTN and amiodarone for afib prevention and tamsulosin for BPH/urinary frequency.  - I called and spoke with patient. He feels voiding issues much better since starting Flomax. He feels he is emptying his bladder. No dysuria. He drinks approximately 64 oz of water daily. [place] and [place]. He and his wife walk for 45-60 minutes 4 days/week, and uses a stationary bike intermittently. He does sweat a lot walking in the heat. Denied N/V/D. Good appetite despite his hernias. He  gets mild LE edema which is unchanged. No chest pain or SOB.   Attempted to contact PCP on 11/22/18, but office is closed. I will sent staff message and follow-up regarding any recommendations. May need to consider rechecking BMET prior to surgery. Discussed with anesthesiologist Adele Barthel, MD.   Jones Skene 11/26/18 5:16 PM: I have been in communication with [name], [place]. Renal insufficiency felt to be more chronic; however, with increase in Creatinine to 1.96 recommendation to repeat prior to surgery date. If result is the same or better and otherwise no acute changes then she felt patient would be okay to proceed with surgery without on-going close out-patient follow-up with her; however, if Cr further elevated then would consider postponing surgery and nephrology evaluation. Recommendation for patient to stay well-hydrated and could consider decreasing or temporarily holding spironolactone but would defer to Dr. Claiborne Billings since he is the prescribing provider. She sent a staff message to Dr. Claiborne Billings regarding this.   Patient came in for repeat BMET on 11/26/18 with stable and slightly improved BUN 29, Cr 1.82. Based on available information and per my conversation with PCP then I would anticipate he can proceed as planned if no acute changes. I updated Dr. Roanna Banning. 11/25/18 COVID-19 test negative.   VS: BP 120/65   Pulse 65   Temp 37 C   Resp 20   Ht 5\' 10"  (1.778 m)   Wt 88.3 kg   SpO2 95%   BMI 27.92 kg/m    PROVIDERS: [name], FNP is PCP ([facility]) [name], MD is cardiologist. Last visit 3/1/620. Bjorn Loser, MD is  urologist. Seen for nocturia and urinary frequency May 2020. He was started on Flomax which has helped a lot. Last visit 11/13/18.     LABS: Preoperative labs noted. See DISCUSSION. (all labs ordered are listed, but only abnormal results are displayed)  Labs Reviewed  BASIC METABOLIC PANEL - Abnormal; Notable for the following  components:      Result Value   Potassium 5.2 (*)    Glucose, Bld 104 (*)    BUN 30 (*)    Creatinine, Ser 1.96 (*)    GFR calc non Af Amer 32 (*)    GFR calc Af Amer 37 (*)    All other components within normal limits  CBC     EKG: 06/20/18: Sinus rhythm Borderline left axis deviation Abnormal R-wave progression, early transition No significant change since last tracing Confirmed by Duffy Bruce [phone]) on 06/20/2018 5:17:54 AM   CV: Echo 06/23/16: Study Conclusions - Left ventricle: The cavity size was normal. Systolic function was   normal. The estimated ejection fraction was in the range of 55%   to 60%. Wall motion was normal; there were no regional wall   motion abnormalities. - Aortic valve: Trileaflet; normal thickness, mildly calcified   leaflets. There was mild regurgitation. Regurgitation pressure   half-time: 662 ms. - Mitral valve: Mild focal calcification of the anterior leaflet   (medial segment(s)). There was trivial regurgitation. - Left atrium: The atrium was severely dilated. - Right ventricle: The cavity size was mildly dilated. Wall   thickness was normal. - Right atrium: The atrium was moderately dilated. - Pulmonic valve: There was trivial regurgitation.   Past Medical History:  Diagnosis Date  . Arthritis   . Cancer ([place])    skin cancer  . Dysrhythmia    afib  . GERD (gastroesophageal reflux disease)   . High cholesterol   . History of kidney stones   . Hx of echocardiogram 12/2010   EF 55%, There is Stage 1 diastolic dyfunction. Normal LV filling pressure. Aortic sclerosis with mild AI, mild TR  . Hypertension   . Palpitation    with ecotopy  . Sleep apnea     Past Surgical History:  Procedure Laterality Date  . CARDIOVERSION N/A 09/12/2016   Procedure: CARDIOVERSION;  Surgeon: Troy Sine, MD;  Location: [facility];  Service: Cardiovascular;  Laterality: N/A;  . EYE SURGERY Bilateral    cataract  . MOHS SURGERY Left     skin cancer remover from left side  . RETINAL DETACHMENT SURGERY Right   . TONSILLECTOMY      MEDICATIONS: . amiodarone (PACERONE) 200 MG tablet  . atorvastatin (LIPITOR) 20 MG tablet  . BYSTOLIC 10 MG tablet  . Cholecalciferol (VITAMIN D) 2000 UNITS tablet  . diltiazem (CARDIZEM CD) 120 MG 24 hr capsule  . ELIQUIS 5 MG TABS tablet  . lisinopril (ZESTRIL) 40 MG tablet  . Multiple Vitamins-Minerals (MULTIVITAMIN ADULT PO)  . NON FORMULARY  . Saccharomyces boulardii (PROBIOTIC) 250 MG CAPS  . sodium chloride (OCEAN) 0.65 % SOLN nasal spray  . spironolactone (ALDACTONE) 25 MG tablet  . tamsulosin (FLOMAX) 0.4 MG CAPS capsule   No current facility-administered medications for this encounter.     Myra Gianotti, PA-C Surgical Short Stay/Anesthesiology [facility] Phone [phone] [facility] Phone [phone] 11/22/2018 6:22 PM

## 2018-11-25 ENCOUNTER — Other Ambulatory Visit (HOSPITAL_COMMUNITY)
Admission: RE | Admit: 2018-11-25 | Discharge: 2018-11-25 | Disposition: A | Discharge status: Home / Self Care | Payer: Medicare Other | Source: Ambulatory Visit | Attending: Surgery | Admitting: Surgery

## 2018-11-25 DIAGNOSIS — Z20828 Contact with and (suspected) exposure to other viral communicable diseases: Secondary | ICD-10-CM | POA: Insufficient documentation

## 2018-11-25 DIAGNOSIS — Z01812 Encounter for preprocedural laboratory examination: Secondary | ICD-10-CM | POA: Insufficient documentation

## 2018-11-25 LAB — SARS CORONAVIRUS 2 (TAT 6-24 HRS): SARS Coronavirus 2: NEGATIVE

## 2018-11-26 ENCOUNTER — Encounter (HOSPITAL_COMMUNITY)
Admission: RE | Admit: 2018-11-26 | Discharge: 2018-11-26 | Disposition: A | Discharge status: Home / Self Care | Payer: Medicare Other | Source: Ambulatory Visit | Attending: Surgery | Admitting: Surgery

## 2018-11-26 ENCOUNTER — Other Ambulatory Visit: Payer: Self-pay

## 2018-11-26 DIAGNOSIS — Z01812 Encounter for preprocedural laboratory examination: Secondary | ICD-10-CM | POA: Insufficient documentation

## 2018-11-26 DIAGNOSIS — I4891 Unspecified atrial fibrillation: Secondary | ICD-10-CM | POA: Diagnosis not present

## 2018-11-26 DIAGNOSIS — Z87891 Personal history of nicotine dependence: Secondary | ICD-10-CM | POA: Diagnosis not present

## 2018-11-26 DIAGNOSIS — Z7901 Long term (current) use of anticoagulants: Secondary | ICD-10-CM | POA: Diagnosis not present

## 2018-11-26 DIAGNOSIS — K4090 Unilateral inguinal hernia, without obstruction or gangrene, not specified as recurrent: Secondary | ICD-10-CM | POA: Diagnosis not present

## 2018-11-26 DIAGNOSIS — N400 Enlarged prostate without lower urinary tract symptoms: Secondary | ICD-10-CM | POA: Diagnosis not present

## 2018-11-26 DIAGNOSIS — Z79899 Other long term (current) drug therapy: Secondary | ICD-10-CM | POA: Diagnosis not present

## 2018-11-26 DIAGNOSIS — G4730 Sleep apnea, unspecified: Secondary | ICD-10-CM | POA: Diagnosis not present

## 2018-11-26 DIAGNOSIS — K429 Umbilical hernia without obstruction or gangrene: Secondary | ICD-10-CM | POA: Diagnosis not present

## 2018-11-26 DIAGNOSIS — I10 Essential (primary) hypertension: Secondary | ICD-10-CM | POA: Diagnosis not present

## 2018-11-26 DIAGNOSIS — Z85820 Personal history of malignant melanoma of skin: Secondary | ICD-10-CM | POA: Diagnosis not present

## 2018-11-26 LAB — BASIC METABOLIC PANEL
Anion gap: 9 (ref 5–15)
BUN: 29 mg/dL — ABNORMAL HIGH (ref 8–23)
CO2: 24 mmol/L (ref 22–32)
Calcium: 9.4 mg/dL (ref 8.9–10.3)
Chloride: 101 mmol/L (ref 98–111)
Creatinine, Ser: 1.82 mg/dL — ABNORMAL HIGH (ref 0.61–1.24)
GFR calc Af Amer: 40 mL/min — ABNORMAL LOW (ref >60)
GFR calc non Af Amer: 35 mL/min — ABNORMAL LOW (ref >60)
Glucose, Bld: 104 mg/dL — ABNORMAL HIGH (ref 70–99)
Potassium: 4.8 mmol/L (ref 3.5–5.1)
Sodium: 134 mmol/L — ABNORMAL LOW (ref 135–145)

## 2018-11-26 NOTE — Anesthesia Preprocedure Evaluation (Addendum)
Anesthesia Evaluation  Patient identified by MRN, date of birth, ID band Patient awake    Reviewed: Allergy & Precautions, NPO status , Patient's Chart, lab work & pertinent test results, reviewed documented beta blocker date and time   Airway Mallampati: II  TM Distance: >3 FB Neck ROM: Full    Dental no notable dental hx. (+) Dental Advisory Given, Teeth Intact   Pulmonary sleep apnea , former smoker,    Pulmonary exam normal breath sounds clear to auscultation       Cardiovascular hypertension, Pt. on home beta blockers and Pt. on medications Normal cardiovascular exam+ dysrhythmias  Rhythm:Regular Rate:Normal     Neuro/Psych negative neurological ROS  negative psych ROS   GI/Hepatic Neg liver ROS, GERD  ,  Endo/Other  negative endocrine ROS  Renal/GU negative Renal ROS     Musculoskeletal  (+) Arthritis ,   Abdominal   Peds  Hematology negative hematology ROS (+)   Anesthesia Other Findings   Reproductive/Obstetrics negative OB ROS                                                             Anesthesia Evaluation  Patient identified by MRN, date of birth, ID band Patient awake    Reviewed: Allergy & Precautions, H&P , NPO status , Patient's Chart, lab work & pertinent test results, reviewed documented beta blocker date and time   Airway Mallampati: II  TM Distance: >3 FB Neck ROM: Full    Dental no notable dental hx. (+) Teeth Intact, Dental Advisory Given   Pulmonary sleep apnea and Continuous Positive Airway Pressure Ventilation , former smoker,    Pulmonary exam normal breath sounds clear to auscultation       Cardiovascular hypertension, Pt. on medications and Pt. on home beta blockers  Rhythm:Regular Rate:Normal     Neuro/Psych negative neurological ROS  negative psych ROS   GI/Hepatic negative GI ROS, Neg liver ROS,   Endo/Other  negative endocrine  ROS  Renal/GU negative Renal ROS  negative genitourinary   Musculoskeletal   Abdominal   Peds  Hematology negative hematology ROS (+)   Anesthesia Other Findings   Reproductive/Obstetrics negative OB ROS                            Anesthesia Physical Anesthesia Plan  ASA: III  Anesthesia Plan: General   Post-op Pain Management:    Induction: Intravenous  PONV Risk Score and Plan: 2 and Propofol and Treatment may vary due to age or medical condition  Airway Management Planned: Mask  Additional Equipment:   Intra-op Plan:   Post-operative Plan:   Informed Consent: I have reviewed the patients History and Physical, chart, labs and discussed the procedure including the risks, benefits and alternatives for the proposed anesthesia with the patient or authorized representative who has indicated his/her understanding and acceptance.   Dental advisory given  Plan Discussed with: CRNA  Anesthesia Plan Comments:         Anesthesia Quick Evaluation  Anesthesia Physical Anesthesia Plan  ASA: III  Anesthesia Plan: General   Post-op Pain Management:    Induction: Intravenous  PONV Risk Score and Plan: 4 or greater and Ondansetron, Dexamethasone and Treatment may vary due to age or  medical condition  Airway Management Planned: Oral ETT  Additional Equipment: None  Intra-op Plan:   Post-operative Plan: Extubation in OR  Informed Consent: I have reviewed the patients History and Physical, chart, labs and discussed the procedure including the risks, benefits and alternatives for the proposed anesthesia with the patient or authorized representative who has indicated his/her understanding and acceptance.     Dental advisory given  Plan Discussed with: CRNA  Anesthesia Plan Comments: (PAT note written by Myra Gianotti, PA-C. )       Anesthesia Quick Evaluation

## 2018-11-27 NOTE — H&P (Signed)
[place]  Location: [facility] Patient #: [id number] DOB: Mar 16, 1941 Married / Language: English / Race: White Male   History of Present Illness (Yulieth Carrender A. Ninfa Linden MD; The patient is a 78 year old male who presents for an evaluation of a hernia. This 78 year old gentleman is a self-referral. He presents with a symptomatic left inguinal hernia as well as an umbilical hernia. He reports that he has had the umbilical hernia for greater than 20 years. It is getting larger and causes no discomfort or obstructive symptoms. He most recently started noticing an intermittent bulge in the left inguinal area which has mild discomfort. It easily reduces as well. He has no obstructive symptoms from that either. He is otherwise without complaints. He is on chronic anticoagulation and is followed closely by his cardiologist. He currently denies fever, chest pain, or shortness of breath. He is very active.   Past Surgical History Nance Pew, CMA;  Cataract Surgery  Bilateral. Tonsillectomy   Diagnostic Studies History Gabriel Cirri [employer], CMA;  Colonoscopy  1-5 years ago  Allergies Gabriel Cirri [place], CMA;  No Known Allergies  [ No Known Drug Allergies  Allergies Reconciled   Medication History (Sabrina Canty, CMA;  Tamsulosin HCl (0.4MG  Capsule, Oral) Active. amLODIPine Besylate (2.5MG  Tablet, Oral) Active. Bystolic (2.5MG  Tablet, Oral) Active. Lisinopril (2.5MG  Tablet, Oral) Active. Eliquis (2.5MG  Tablet, Oral) Active. Medications Reconciled  Social History ( Alcohol use  Remotely quit alcohol use. Caffeine use  Coffee, Tea. No drug use  Tobacco use  Former smoker.  Family History Arthritis  Father. Cerebrovascular Accident  Father. Diabetes Mellitus  Father. Heart Disease  Mother.  Other Problems  Atrial Fibrillation  Enlarged Prostate  Gastroesophageal Reflux Disease  High blood pressure  Hypercholesterolemia  Inguinal Hernia   Melanoma  Sleep Apnea  Umbilical Hernia Repair     Review of Systems  General Not Present- Appetite Loss, Chills, Fatigue, Fever, Night Sweats, Weight Gain and Weight Loss. Skin Not Present- Change in Wart/Mole, Dryness, Hives, Jaundice, New Lesions, Non-Healing Wounds, Rash and Ulcer. HEENT Present- Hearing Loss. Not Present- Earache, Hoarseness, Nose Bleed, Oral Ulcers, Ringing in the Ears, Seasonal Allergies, Sinus Pain, Sore Throat, Visual Disturbances, Wears glasses/contact lenses and Yellow Eyes. Respiratory Present- Snoring. Not Present- Bloody sputum, Chronic Cough, Difficulty Breathing and Wheezing. Breast Not Present- Breast Mass, Breast Pain, Nipple Discharge and Skin Changes. Cardiovascular Not Present- Chest Pain, Difficulty Breathing Lying Down, Leg Cramps, Palpitations, Rapid Heart Rate, Shortness of Breath and Swelling of Extremities. Gastrointestinal Not Present- Abdominal Pain, Bloating, Bloody Stool, Change in Bowel Habits, Chronic diarrhea, Constipation, Difficulty Swallowing, Excessive gas, Gets full quickly at meals, Hemorrhoids, Indigestion, Nausea, Rectal Pain and Vomiting. Male Genitourinary Present- Nocturia. Not Present- Blood in Urine, Change in Urinary Stream, Frequency, Impotence, Painful Urination, Urgency and Urine Leakage. Musculoskeletal Not Present- Back Pain, Joint Pain, Joint Stiffness, Muscle Pain, Muscle Weakness and Swelling of Extremities. Neurological Not Present- Decreased Memory, Fainting, Headaches, Numbness, Seizures, Tingling, Tremor, Trouble walking and Weakness. Psychiatric Not Present- Anxiety, Bipolar, Change in Sleep Pattern, Depression, Fearful and Frequent crying. Endocrine Not Present- Cold Intolerance, Excessive Hunger, Hair Changes, Heat Intolerance, Hot flashes and New Diabetes. Hematology Present- Blood Thinners. Not Present- Easy Bruising, Excessive bleeding, Gland problems, HIV and Persistent Infections.  Vitals  Weight: 196  lb Height: 70in Body Surface Area: 2.07 m Body Mass Index: 28.12 kg/m  Temp.: 98.78F (Oral)  Pulse: 96 (Regular)  BP: 138/76(Sitting, Left Arm, Standard)       Physical Exam  General  Mental Status-Alert. General Appearance-Consistent with stated age. Hydration-Well hydrated. Voice-Normal.  Head and Neck Head-normocephalic, atraumatic with no lesions or palpable masses. Trachea-midline.  Eye Eyeball - Bilateral-Extraocular movements intact. Sclera/Conjunctiva - Bilateral-No scleral icterus.  Chest and Lung Exam Chest and lung exam reveals -quiet, even and easy respiratory effort with no use of accessory muscles and on auscultation, normal breath sounds, no adventitious sounds and normal vocal resonance. Inspection Chest Wall - Normal. Back - normal.  Cardiovascular Cardiovascular examination reveals -normal heart sounds, regular rate and rhythm with no murmurs and normal pedal pulses bilaterally.  Abdomen Inspection Skin - Scar - no surgical scars. Hernias - Umbilical hernia - Reducible. Note: This is a large, easily reducible umbilical hernia. Inguinal hernia - Left - Reducible - Left. Inguinal hernia - Right - Note: I cannot feel a right inguinal hernia. Palpation/Percussion Palpation and Percussion of the abdomen reveal - Soft, Non Tender, No Rebound tenderness, No Rigidity (guarding) and No hepatosplenomegaly. Auscultation Auscultation of the abdomen reveals - Bowel sounds normal.  Neurologic - Did not examine.  Musculoskeletal - Did not examine.    Assessment & Plan  LEFT INGUINAL HERNIA (K40.90)  Impression: This is a patient with both a left inguinal hernia as well as umbilical hernia. I discussed the diagnosis and anatomy of hernias with him in detail. We discussed the reasoning for proceeding with surgical repair as he is symptomatic and on chronic anticoagulation. We discussed the risks of incarceration is regulation  without surgery. I would recommend a laparoscopic left inguinal hernia repair with mesh. This would allow me to evaluate the right side and place mesh as well. I would then complete the procedure with an open umbilical hernia repair with mesh. I discussed the procedure with him in detail. We discussed the risks of the procedure. These include but are not limited to bleeding, infection, injury to surrounding structures, nerve entrapment, chronic pain, hernia recurrence, cardiopulmonary issues, etc. We will need to get cardiac clearance prior to general anesthesia and he will need to stop his anticoagulation medication 48 hours preoperatively. He understands and wishes to proceed with surgery   UMBILICAL HERNIA ([id number])

## 2018-11-28 ENCOUNTER — Encounter (HOSPITAL_COMMUNITY)
Admission: RE | Disposition: A | Discharge status: Home / Self Care | Payer: Self-pay | Source: Home / Self Care | Attending: Surgery

## 2018-11-28 ENCOUNTER — Other Ambulatory Visit: Payer: Self-pay

## 2018-11-28 ENCOUNTER — Ambulatory Visit (HOSPITAL_COMMUNITY)
Admission: RE | Admit: 2018-11-28 | Discharge: 2018-11-28 | Disposition: A | Discharge status: Home / Self Care | Payer: Medicare Other | Attending: Surgery | Admitting: Surgery

## 2018-11-28 ENCOUNTER — Ambulatory Visit (HOSPITAL_COMMUNITY): Payer: Medicare Other | Admitting: Vascular Surgery

## 2018-11-28 ENCOUNTER — Encounter (HOSPITAL_COMMUNITY): Payer: Self-pay | Admitting: *Deleted

## 2018-11-28 ENCOUNTER — Other Ambulatory Visit: Payer: Self-pay | Admitting: Cardiovascular Disease

## 2018-11-28 ENCOUNTER — Ambulatory Visit (HOSPITAL_COMMUNITY): Payer: Medicare Other | Admitting: Anesthesiology

## 2018-11-28 DIAGNOSIS — G4733 Obstructive sleep apnea (adult) (pediatric): Secondary | ICD-10-CM | POA: Diagnosis not present

## 2018-11-28 DIAGNOSIS — K429 Umbilical hernia without obstruction or gangrene: Secondary | ICD-10-CM | POA: Insufficient documentation

## 2018-11-28 DIAGNOSIS — Z7901 Long term (current) use of anticoagulants: Secondary | ICD-10-CM | POA: Insufficient documentation

## 2018-11-28 DIAGNOSIS — Z85820 Personal history of malignant melanoma of skin: Secondary | ICD-10-CM | POA: Insufficient documentation

## 2018-11-28 DIAGNOSIS — Z79899 Other long term (current) drug therapy: Secondary | ICD-10-CM | POA: Diagnosis not present

## 2018-11-28 DIAGNOSIS — Z87891 Personal history of nicotine dependence: Secondary | ICD-10-CM | POA: Insufficient documentation

## 2018-11-28 DIAGNOSIS — G4730 Sleep apnea, unspecified: Secondary | ICD-10-CM | POA: Insufficient documentation

## 2018-11-28 DIAGNOSIS — I10 Essential (primary) hypertension: Secondary | ICD-10-CM | POA: Diagnosis not present

## 2018-11-28 DIAGNOSIS — K4090 Unilateral inguinal hernia, without obstruction or gangrene, not specified as recurrent: Secondary | ICD-10-CM | POA: Diagnosis not present

## 2018-11-28 DIAGNOSIS — N400 Enlarged prostate without lower urinary tract symptoms: Secondary | ICD-10-CM | POA: Diagnosis not present

## 2018-11-28 DIAGNOSIS — I4891 Unspecified atrial fibrillation: Secondary | ICD-10-CM | POA: Insufficient documentation

## 2018-11-28 DIAGNOSIS — E782 Mixed hyperlipidemia: Secondary | ICD-10-CM | POA: Diagnosis not present

## 2018-11-28 HISTORY — PX: INGUINAL HERNIA REPAIR: SHX194

## 2018-11-28 HISTORY — PX: UMBILICAL HERNIA REPAIR: SHX196

## 2018-11-28 LAB — PROTIME-INR
INR: 1 (ref 0.8–1.2)
Prothrombin Time: 13.5 seconds (ref 11.4–15.2)

## 2018-11-28 SURGERY — REPAIR, HERNIA, INGUINAL, LAPAROSCOPIC
Anesthesia: General

## 2018-11-28 MED ORDER — TRAMADOL HCL 50 MG PO TABS
50.0000 mg | ORAL_TABLET | Freq: Once | ORAL | Status: AC
  Administered 2018-11-28: 50 mg via ORAL

## 2018-11-28 MED ORDER — CHLORHEXIDINE GLUCONATE CLOTH 2 % EX PADS: 6.0000 | MEDICATED_PAD | Freq: Once | CUTANEOUS | Status: DC

## 2018-11-28 MED ORDER — BUPIVACAINE-EPINEPHRINE (PF) 0.25% -1:200000 IJ SOLN
INTRAMUSCULAR | Status: AC
  Filled 2018-11-28: qty 30

## 2018-11-28 MED ORDER — MEPERIDINE HCL 25 MG/ML IJ SOLN: 6.2500 mg | INTRAMUSCULAR | Status: DC | PRN

## 2018-11-28 MED ORDER — TRAMADOL HCL 50 MG PO TABS
ORAL_TABLET | ORAL | Status: AC
  Filled 2018-11-28: qty 1

## 2018-11-28 MED ORDER — BUPIVACAINE-EPINEPHRINE 0.25% -1:200000 IJ SOLN
INTRAMUSCULAR | Status: DC | PRN
  Administered 2018-11-28: 20 mL

## 2018-11-28 MED ORDER — LIDOCAINE 2% (20 MG/ML) 5 ML SYRINGE
INTRAMUSCULAR | Status: DC | PRN
  Administered 2018-11-28: 100 mg via INTRAVENOUS

## 2018-11-28 MED ORDER — ACETAMINOPHEN 500 MG PO TABS
1000.0000 mg | ORAL_TABLET | ORAL | Status: AC
  Administered 2018-11-28: 08:00:00 1000 mg via ORAL
  Filled 2018-11-28: qty 2

## 2018-11-28 MED ORDER — GABAPENTIN 300 MG PO CAPS
ORAL_CAPSULE | ORAL | Status: AC
  Filled 2018-11-28: qty 1

## 2018-11-28 MED ORDER — DEXAMETHASONE SODIUM PHOSPHATE 10 MG/ML IJ SOLN
INTRAMUSCULAR | Status: AC
  Filled 2018-11-28: qty 1

## 2018-11-28 MED ORDER — HYDROMORPHONE HCL 1 MG/ML IJ SOLN
INTRAMUSCULAR | Status: AC
  Filled 2018-11-28: qty 1

## 2018-11-28 MED ORDER — SUGAMMADEX SODIUM 200 MG/2ML IV SOLN
INTRAVENOUS | Status: DC | PRN
  Administered 2018-11-28: 177 mg via INTRAVENOUS

## 2018-11-28 MED ORDER — GLYCOPYRROLATE PF 0.2 MG/ML IJ SOSY
PREFILLED_SYRINGE | INTRAMUSCULAR | Status: AC
  Filled 2018-11-28: qty 1

## 2018-11-28 MED ORDER — BUPIVACAINE HCL (PF) 0.25 % IJ SOLN
INTRAMUSCULAR | Status: AC
  Filled 2018-11-28: qty 30

## 2018-11-28 MED ORDER — TRAMADOL HCL 50 MG PO TABS: 50.0000 mg | ORAL_TABLET | Freq: Four times a day (QID) | ORAL | 0 refills | Status: DC | PRN

## 2018-11-28 MED ORDER — ROCURONIUM BROMIDE 10 MG/ML (PF) SYRINGE
PREFILLED_SYRINGE | INTRAVENOUS | Status: DC | PRN
  Administered 2018-11-28: 40 mg via INTRAVENOUS

## 2018-11-28 MED ORDER — LACTATED RINGERS IV SOLN
INTRAVENOUS | Status: DC | PRN
  Administered 2018-11-28 (×2): via INTRAVENOUS

## 2018-11-28 MED ORDER — ONDANSETRON HCL 4 MG/2ML IJ SOLN: 4.0000 mg | Freq: Once | INTRAMUSCULAR | Status: DC | PRN

## 2018-11-28 MED ORDER — 0.9 % SODIUM CHLORIDE (POUR BTL) OPTIME
TOPICAL | Status: DC | PRN
  Administered 2018-11-28: 1000 mL

## 2018-11-28 MED ORDER — HYDROMORPHONE HCL 1 MG/ML IJ SOLN
0.2500 mg | INTRAMUSCULAR | Status: DC | PRN
  Administered 2018-11-28: 0.5 mg via INTRAVENOUS

## 2018-11-28 MED ORDER — EPHEDRINE 5 MG/ML INJ
INTRAVENOUS | Status: AC
  Filled 2018-11-28: qty 10

## 2018-11-28 MED ORDER — PROPOFOL 10 MG/ML IV BOLUS
INTRAVENOUS | Status: AC
  Filled 2018-11-28: qty 40

## 2018-11-28 MED ORDER — FENTANYL CITRATE (PF) 100 MCG/2ML IJ SOLN
INTRAMUSCULAR | Status: AC
  Filled 2018-11-28: qty 2

## 2018-11-28 MED ORDER — SODIUM CHLORIDE 0.9 % IV SOLN
INTRAVENOUS | Status: DC | PRN
  Administered 2018-11-28: 25 ug/min via INTRAVENOUS

## 2018-11-28 MED ORDER — MIDAZOLAM HCL 2 MG/2ML IJ SOLN
INTRAMUSCULAR | Status: AC
  Filled 2018-11-28: qty 2

## 2018-11-28 MED ORDER — GLYCOPYRROLATE 0.2 MG/ML IJ SOLN
INTRAMUSCULAR | Status: DC | PRN
  Administered 2018-11-28: 0.2 mg via INTRAVENOUS

## 2018-11-28 MED ORDER — BUPIVACAINE HCL (PF) 0.25 % IJ SOLN
INTRAMUSCULAR | Status: DC | PRN
  Administered 2018-11-28: 20 mL

## 2018-11-28 MED ORDER — CEFAZOLIN SODIUM-DEXTROSE 2-4 GM/100ML-% IV SOLN
2.0000 g | INTRAVENOUS | Status: AC
  Administered 2018-11-28: 09:00:00 2 g via INTRAVENOUS
  Filled 2018-11-28: qty 100

## 2018-11-28 MED ORDER — FENTANYL CITRATE (PF) 250 MCG/5ML IJ SOLN
INTRAMUSCULAR | Status: DC | PRN
  Administered 2018-11-28: 100 ug via INTRAVENOUS
  Administered 2018-11-28: 50 ug via INTRAVENOUS

## 2018-11-28 MED ORDER — LIDOCAINE 2% (20 MG/ML) 5 ML SYRINGE
INTRAMUSCULAR | Status: AC
  Filled 2018-11-28: qty 5

## 2018-11-28 MED ORDER — GABAPENTIN 300 MG PO CAPS
300.0000 mg | ORAL_CAPSULE | ORAL | Status: AC
  Administered 2018-11-28: 08:00:00 300 mg via ORAL
  Filled 2018-11-28: qty 1

## 2018-11-28 MED ORDER — ROCURONIUM BROMIDE 10 MG/ML (PF) SYRINGE
PREFILLED_SYRINGE | INTRAVENOUS | Status: AC
  Filled 2018-11-28: qty 10

## 2018-11-28 MED ORDER — EPHEDRINE SULFATE 50 MG/ML IJ SOLN
INTRAMUSCULAR | Status: DC | PRN
  Administered 2018-11-28: 10 mg via INTRAVENOUS

## 2018-11-28 MED ORDER — ONDANSETRON HCL 4 MG/2ML IJ SOLN
INTRAMUSCULAR | Status: AC
  Filled 2018-11-28: qty 2

## 2018-11-28 MED ORDER — PROPOFOL 10 MG/ML IV BOLUS
INTRAVENOUS | Status: DC | PRN
  Administered 2018-11-28: 30 mg via INTRAVENOUS
  Administered 2018-11-28: 120 mg via INTRAVENOUS

## 2018-11-28 MED ORDER — ONDANSETRON HCL 4 MG/2ML IJ SOLN
INTRAMUSCULAR | Status: DC | PRN
  Administered 2018-11-28: 4 mg via INTRAVENOUS

## 2018-11-28 MED ORDER — FENTANYL CITRATE (PF) 250 MCG/5ML IJ SOLN
INTRAMUSCULAR | Status: AC
  Filled 2018-11-28: qty 5

## 2018-11-28 MED ORDER — DEXAMETHASONE SODIUM PHOSPHATE 10 MG/ML IJ SOLN
INTRAMUSCULAR | Status: DC | PRN
  Administered 2018-11-28: 5 mg via INTRAVENOUS

## 2018-11-28 SURGICAL SUPPLY — 43 items
ADH SKN CLS APL DERMABOND .7 (GAUZE/BANDAGES/DRESSINGS) ×2
APL PRP STRL LF DISP 70% ISPRP (MISCELLANEOUS) ×2
BLADE CLIPPER SURG (BLADE) ×2 IMPLANT
CANISTER SUCT 3000ML PPV (MISCELLANEOUS) ×2 IMPLANT
CHLORAPREP W/TINT 26 (MISCELLANEOUS) ×4 IMPLANT
COVER SURGICAL LIGHT HANDLE (MISCELLANEOUS) ×4 IMPLANT
COVER WAND RF STERILE (DRAPES) ×4 IMPLANT
DECANTER SPIKE VIAL GLASS SM (MISCELLANEOUS) ×4 IMPLANT
DERMABOND ADVANCED (GAUZE/BANDAGES/DRESSINGS) ×2
DERMABOND ADVANCED .7 DNX12 (GAUZE/BANDAGES/DRESSINGS) ×2 IMPLANT
DEVICE SECURE STRAP 25 ABSORB (INSTRUMENTS) ×4 IMPLANT
DISSECT BALLN SPACEMKR + OVL (BALLOONS) ×4
DISSECTOR BALLN SPACEMKR + OVL (BALLOONS) ×2 IMPLANT
DRAPE LAPAROSCOPIC ABDOMINAL (DRAPES) ×4 IMPLANT
DRAPE LAPAROTOMY 100X72 PEDS (DRAPES) ×4 IMPLANT
ELECT REM PT RETURN 9FT ADLT (ELECTROSURGICAL) ×4
ELECTRODE REM PT RTRN 9FT ADLT (ELECTROSURGICAL) ×2 IMPLANT
GLOVE SURG SIGNA 7.5 PF LTX (GLOVE) ×4 IMPLANT
GOWN STRL REUS W/ TWL LRG LVL3 (GOWN DISPOSABLE) ×4 IMPLANT
GOWN STRL REUS W/ TWL XL LVL3 (GOWN DISPOSABLE) ×2 IMPLANT
GOWN STRL REUS W/TWL LRG LVL3 (GOWN DISPOSABLE) ×8
GOWN STRL REUS W/TWL XL LVL3 (GOWN DISPOSABLE) ×4
KIT BASIN OR (CUSTOM PROCEDURE TRAY) ×4 IMPLANT
KIT TURNOVER KIT B (KITS) ×4 IMPLANT
MESH 3DMAX 4X6 LT LRG (Mesh General) ×2 IMPLANT
MESH VENTRALEX ST 2.5 CRC MED (Mesh General) ×2 IMPLANT
NDL HYPO 25GX1X1/2 BEV (NEEDLE) ×2 IMPLANT
NEEDLE HYPO 25GX1X1/2 BEV (NEEDLE) ×4 IMPLANT
NS IRRIG 1000ML POUR BTL (IV SOLUTION) ×4 IMPLANT
PACK GENERAL/GYN (CUSTOM PROCEDURE TRAY) ×4 IMPLANT
PAD ARMBOARD 7.5X6 YLW CONV (MISCELLANEOUS) ×4 IMPLANT
PENCIL SMOKE EVACUATOR (MISCELLANEOUS) ×4 IMPLANT
SET TROCAR LAP APPLE-HUNT 5MM (ENDOMECHANICALS) ×4 IMPLANT
SET TUBE SMOKE EVAC HIGH FLOW (TUBING) ×4 IMPLANT
SUT MNCRL AB 4-0 PS2 18 (SUTURE) ×4 IMPLANT
SUT NOVA NAB DX-16 0-1 5-0 T12 (SUTURE) ×4 IMPLANT
SUT VIC AB 3-0 SH 27 (SUTURE) ×4
SUT VIC AB 3-0 SH 27X BRD (SUTURE) ×2 IMPLANT
SYR CONTROL 10ML LL (SYRINGE) ×4 IMPLANT
TOWEL GREEN STERILE (TOWEL DISPOSABLE) ×4 IMPLANT
TOWEL GREEN STERILE FF (TOWEL DISPOSABLE) ×4 IMPLANT
TRAY LAPAROSCOPIC MC (CUSTOM PROCEDURE TRAY) ×4 IMPLANT
WATER STERILE IRR 1000ML POUR (IV SOLUTION) ×4 IMPLANT

## 2018-11-28 NOTE — Transfer of Care (Signed)
Immediate Anesthesia Transfer of Care Note  Patient: Mario Palmer  Procedure(s) Performed: LAPAROSCOPIC  LEFT INGUINAL HERNIA WITH MESH (Left ) OPEN UMBILICAL HERNIA REPAIR WITH MESH (N/A )  Patient Location: PACU  Anesthesia Type:General  Level of Consciousness: awake, alert  and oriented  Airway & Oxygen Therapy: Patient Spontanous Breathing and Patient connected to face mask oxygen  Post-op Assessment: Report given to RN and Post -op Vital signs reviewed and stable  Post vital signs: Reviewed and stable  Last Vitals:  Vitals Value Taken Time  BP 100/61 11/28/18 0944  Temp    Pulse 60 11/28/18 0945  Resp 17 11/28/18 0945  SpO2 100 % 11/28/18 0945  Vitals shown include unvalidated device data.  Last Pain:  Vitals:   11/28/18 0757  TempSrc:   PainSc: 0-No pain      Patients Stated Pain Goal: 3 ([id number])  Complications: No apparent anesthesia complications

## 2018-11-28 NOTE — Anesthesia Procedure Notes (Signed)
Procedure Name: Intubation Date/Time: 11/28/2018 8:52 AM Performed by: Alain Marion, CRNA Pre-anesthesia Checklist: Patient identified, Emergency Drugs available, Suction available and Patient being monitored Patient Re-evaluated:Patient Re-evaluated prior to induction Oxygen Delivery Method: Circle System Utilized Preoxygenation: Pre-oxygenation with 100% oxygen Induction Type: IV induction Ventilation: Mask ventilation without difficulty Laryngoscope Size: Miller and 2 Grade View: Grade II Tube type: Oral Tube size: 7.5 mm Number of attempts: 1 Airway Equipment and Method: Stylet and Oral airway Placement Confirmation: ETT inserted through vocal cords under direct vision,  positive ETCO2 and breath sounds checked- equal and bilateral Secured at: 22 cm Tube secured with: Tape Dental Injury: Teeth and Oropharynx as per pre-operative assessment

## 2018-11-28 NOTE — Interval H&P Note (Signed)
History and Physical Interval Note:no change in H and P  11/28/2018 7:17 AM  Mario Palmer  has presented today for surgery, with the diagnosis of left inguinal hernia, umbilical hernia.  The various methods of treatment have been discussed with the patient and family. After consideration of risks, benefits and other options for treatment, the patient has consented to  Procedure(s): LAPAROSCOPIC  LEFT INGUINAL HERNIA WITH MESH, POSSIBLE RIGHT (Left) OPEN UMBILICAL HERNIA REPAIR WITH MESH (N/A) as a surgical intervention.  The patient's history has been reviewed, patient examined, no change in status, stable for surgery.  I have reviewed the patient's chart and labs.  Questions were answered to the patient's satisfaction.     Coralie Keens

## 2018-11-28 NOTE — Op Note (Signed)
LAPAROSCOPIC  LEFT INGUINAL HERNIA WITH MESH, OPEN UMBILICAL HERNIA REPAIR WITH MESH  Procedure Note  Mario Palmer 11/28/2018   Pre-op Diagnosis: left inguinal hernia, umbilical hernia     Post-op Diagnosis: same  Procedure(s): LAPAROSCOPIC  LEFT INGUINAL HERNIA WITH MESH OPEN UMBILICAL HERNIA REPAIR WITH MESH  Surgeon(s): Coralie Keens, MD  Anesthesia: General  Staff:  Circulator: Marliss Czar, RN Relief Circulator: Garald Braver, RN Scrub Person: Deland Pretty, RN Circulator Assistant: Cecilio Asper, RN  Estimated Blood Loss: Minimal               Findings: The patient had a large indirect left inguinal hernia.  There was no evidence of right inguinal hernia.  This was repaired with a large piece of Prolene 3 DMax mesh.  The umbilical hernia was repaired with a 6.4 cm round ventral light ST patch  Procedure: The patient was brought to the operating identified as the correct patient.  He was placed upon on the operating room table and general anesthesia was induced.  His abdomen was then prepped and draped in the usual sterile fashion.  I made a small vertical incision just below the umbilicus with a scalpel.  I carried this down to fascia which was then opened with a scalpel.  The rectus muscle was identified and elevated.  The dissecting balloon was passed underneath the rectus fascia manipulated toward the pubis.  The dissecting balloon was then insufflated under direct vision dissecting out the preperitoneal space.  The dissecting balloon was then removed and insufflation was begun.  I placed two 5 mm trochars in the patient's lower midline under direct vision.  I evaluated both inguinal areas.  There was no evidence of right inguinal hernia.  The patient had a large indirect left inguinal hernia.  I was able to easily reduce the hernia sac from the cord structures.  There was no evidence of direct defect.  A piece of large Prolene Bard 3 DMax mesh was brought to the  field.  It was placed through the opening at the umbilicus and then opened as an onlay on the left inguinal floor.  I then used the absorbable tacker to tack the mesh to Cooper's ligament, up the medial abdominal wall, and slightly laterally.  Wide coverage of the cord structures and defect appeared to be achieved.  Hemostasis also appeared to be achieved.  All ports were then removed and the preperitoneal space was deflated with the mesh lying appropriately.  Next I  extended the incision at the umbilicus slightly higher.  I separated the umbilical hernia sac from the overlying umbilical skin.  I then excised the sac in its entirety.  The fascia was evaluated circumferentially and there was no further attachments.  I placed a 6.4 cm round ventral patch through the opening and then pulled it up against the peritoneum.  It was then sutured in place with #1 Novafil suture circumferentially.  The fascia was closed over the top of the mesh with a figure-of-eight #1 Novafil suture.  I closed the small fascial defect from the inguinal hernia port with a figure-of-eight 0 Vicryl suture.  I then anesthetized the fascia and the incisions with Marcaine.  I tacked the umbilical skin back in place with 3-0 Vicryl suture.  All incisions were then closed with 4-0 Monocryl.  Dermabond was then applied.  The patient tolerated procedure well.  All the counts were correct at the end of the procedure.  The patient was then extubated  in the operating room and taken a stable condition to the recovery room.          Coralie Keens   Date: 11/28/2018  Time: 9:36 AM

## 2018-11-28 NOTE — Discharge Instructions (Signed)
[facility] _______[facility]  UMBILICAL OR INGUINAL HERNIA REPAIR: POST OP INSTRUCTIONS  Always review your discharge instruction sheet given to you by the facility where your surgery was performed. IF YOU HAVE DISABILITY OR FAMILY LEAVE FORMS, YOU MUST BRING THEM TO THE OFFICE FOR PROCESSING.   DO NOT GIVE THEM TO YOUR DOCTOR.  1. A  prescription for pain medication may be given to you upon discharge.  Take your pain medication as prescribed, if needed.  If narcotic pain medicine is not needed, then you may take acetaminophen (Tylenol) or ibuprofen (Advil) as needed. 2. Take your usually prescribed medications unless otherwise directed. If you need a refill on your pain medication, please contact your pharmacy.  They will contact our office to request authorization. Prescriptions will not be filled after 5 pm or on week-ends. 3. You should follow a light diet the first 24 hours after arrival home, such as soup and crackers, etc.  Be sure to include lots of fluids daily.  Resume your normal diet the day after surgery. 4.Most patients will experience some swelling and bruising around the umbilicus or in the groin and scrotum.  Ice packs and reclining will help.  Swelling and bruising can take several days to resolve.  6. It is common to experience some constipation if taking pain medication after surgery.  Increasing fluid intake and taking a stool softener (such as Colace) will usually help or prevent this problem from occurring.  A mild laxative (Milk of Magnesia or Miralax) should be taken according to package directions if there are no bowel movements after 48 hours. 7. Unless discharge instructions indicate otherwise, you may remove your bandages 24-48 hours after surgery, and you may shower at that time.  You may have steri-strips (small skin tapes) in place directly over the incision.  These strips should be left on the skin for 7-10 days.  If your surgeon used skin glue on the  incision, you may shower in 24 hours.  The glue will flake off over the next 2-3 weeks.  Any sutures or staples will be removed at the office during your follow-up visit. 8. ACTIVITIES:  You may resume regular (light) daily activities beginning the next day--such as daily self-care, walking, climbing stairs--gradually increasing activities as tolerated.  You may have sexual intercourse when it is comfortable.  Refrain from any heavy lifting or straining until approved by your doctor.  a.You may drive when you are no longer taking prescription pain medication, you can comfortably wear a seatbelt, and you can safely maneuver your car and apply brakes. b.RETURN TO WORK:   _____________________________________________  9.You should see your doctor in the office for a follow-up appointment approximately 2-3 weeks after your surgery.  Make sure that you call for this appointment within a day or two after you arrive home to insure a convenient appointment time. 10.OTHER INSTRUCTIONS: _OK TO SHOWER STARTING TOMORROW ICE PACK, TYLENOL, AND IBUPROFEN ALSO FOR PAIN NO LIFTING MORE THAN 15 TO 20 POUNDS FOR 4 WEEKS________________________    _____________________________________  WHEN TO CALL YOUR DOCTOR: 1. Fever over 101.0 2. Inability to urinate 3. Nausea and/or vomiting 4. Extreme swelling or bruising 5. Continued bleeding from incision. 6. Increased pain, redness, or drainage from the incision  The clinic staff is available to answer your questions during regular business hours.  Please don't hesitate to call and ask to speak to one of the nurses for clinical concerns.  If you have a medical emergency, go to   the nearest emergency room or call 911.  A surgeon from [facility] is always on call at the hospital   [address], [address], [place], [place]  [address] ?  [address] [address], [place], [place]   [address] [phone] ? [phone] ? FAX [phone] Web site:  [email or web address] 

## 2018-11-28 NOTE — Anesthesia Postprocedure Evaluation (Signed)
Anesthesia Post Note  Patient: Mario Palmer  Procedure(s) Performed: LAPAROSCOPIC  LEFT INGUINAL HERNIA WITH MESH (Left ) OPEN UMBILICAL HERNIA REPAIR WITH MESH (N/A )     Patient location during evaluation: PACU Anesthesia Type: General Level of consciousness: sedated and patient cooperative Pain management: pain level controlled Vital Signs Assessment: post-procedure vital signs reviewed and stable Respiratory status: spontaneous breathing Cardiovascular status: stable Anesthetic complications: no    Last Vitals:  Vitals:   11/28/18 1130 11/28/18 1140  BP: 103/62 (!) 102/54  Pulse: (!) 55 (!) 56  Resp: 13 14  Temp: 36.8 C   SpO2: 98% 93%    Last Pain:  Vitals:   11/28/18 1130  TempSrc:   PainSc: [place]

## 2018-11-29 ENCOUNTER — Encounter (HOSPITAL_COMMUNITY): Payer: Self-pay | Admitting: Surgery

## 2018-12-03 ENCOUNTER — Encounter (HOSPITAL_COMMUNITY): Payer: Self-pay | Admitting: Surgery

## 2018-12-03 ENCOUNTER — Emergency Department (HOSPITAL_COMMUNITY): Payer: Medicare Other

## 2018-12-03 ENCOUNTER — Other Ambulatory Visit: Payer: Self-pay

## 2018-12-03 ENCOUNTER — Inpatient Hospital Stay (HOSPITAL_COMMUNITY)
Admission: EM | Admit: 2018-12-03 | Discharge: 2018-12-07 | DRG: 390 | Disposition: A | Discharge status: Home / Self Care | Payer: Medicare Other | Attending: Surgery | Admitting: Surgery

## 2018-12-03 DIAGNOSIS — Z7901 Long term (current) use of anticoagulants: Secondary | ICD-10-CM | POA: Diagnosis not present

## 2018-12-03 DIAGNOSIS — E782 Mixed hyperlipidemia: Secondary | ICD-10-CM | POA: Diagnosis present

## 2018-12-03 DIAGNOSIS — G4730 Sleep apnea, unspecified: Secondary | ICD-10-CM | POA: Diagnosis present

## 2018-12-03 DIAGNOSIS — K9189 Other postprocedural complications and disorders of digestive system: Secondary | ICD-10-CM | POA: Diagnosis present

## 2018-12-03 DIAGNOSIS — D72829 Elevated white blood cell count, unspecified: Secondary | ICD-10-CM | POA: Diagnosis not present

## 2018-12-03 DIAGNOSIS — Z79891 Long term (current) use of opiate analgesic: Secondary | ICD-10-CM | POA: Diagnosis not present

## 2018-12-03 DIAGNOSIS — K567 Ileus, unspecified: Secondary | ICD-10-CM | POA: Diagnosis not present

## 2018-12-03 DIAGNOSIS — H918X3 Other specified hearing loss, bilateral: Secondary | ICD-10-CM | POA: Diagnosis present

## 2018-12-03 DIAGNOSIS — G4733 Obstructive sleep apnea (adult) (pediatric): Secondary | ICD-10-CM | POA: Diagnosis present

## 2018-12-03 DIAGNOSIS — K219 Gastro-esophageal reflux disease without esophagitis: Secondary | ICD-10-CM | POA: Diagnosis present

## 2018-12-03 DIAGNOSIS — N200 Calculus of kidney: Secondary | ICD-10-CM | POA: Diagnosis not present

## 2018-12-03 DIAGNOSIS — I10 Essential (primary) hypertension: Secondary | ICD-10-CM | POA: Diagnosis not present

## 2018-12-03 DIAGNOSIS — K56609 Unspecified intestinal obstruction, unspecified as to partial versus complete obstruction: Secondary | ICD-10-CM | POA: Diagnosis not present

## 2018-12-03 DIAGNOSIS — Z85828 Personal history of other malignant neoplasm of skin: Secondary | ICD-10-CM | POA: Diagnosis not present

## 2018-12-03 DIAGNOSIS — J3089 Other allergic rhinitis: Secondary | ICD-10-CM | POA: Diagnosis not present

## 2018-12-03 DIAGNOSIS — Z87891 Personal history of nicotine dependence: Secondary | ICD-10-CM | POA: Diagnosis not present

## 2018-12-03 DIAGNOSIS — Z79899 Other long term (current) drug therapy: Secondary | ICD-10-CM

## 2018-12-03 DIAGNOSIS — Z20828 Contact with and (suspected) exposure to other viral communicable diseases: Secondary | ICD-10-CM | POA: Diagnosis present

## 2018-12-03 DIAGNOSIS — Z03818 Encounter for observation for suspected exposure to other biological agents ruled out: Secondary | ICD-10-CM | POA: Diagnosis not present

## 2018-12-03 HISTORY — DX: Other postprocedural complications and disorders of digestive system: K56.7

## 2018-12-03 LAB — COMPREHENSIVE METABOLIC PANEL
ALT: 18 U/L (ref 0–44)
AST: 18 U/L (ref 15–41)
Albumin: 3.6 g/dL (ref 3.5–5.0)
Alkaline Phosphatase: 64 U/L (ref 38–126)
Anion gap: 13 (ref 5–15)
BUN: 22 mg/dL (ref 8–23)
CO2: 27 mmol/L (ref 22–32)
Calcium: 9.8 mg/dL (ref 8.9–10.3)
Chloride: 95 mmol/L — ABNORMAL LOW (ref 98–111)
Creatinine, Ser: 1.81 mg/dL — ABNORMAL HIGH (ref 0.61–1.24)
GFR calc Af Amer: 40 mL/min — ABNORMAL LOW (ref >60)
GFR calc non Af Amer: 35 mL/min — ABNORMAL LOW (ref >60)
Glucose, Bld: 136 mg/dL — ABNORMAL HIGH (ref 70–99)
Potassium: 4.4 mmol/L (ref 3.5–5.1)
Sodium: 135 mmol/L (ref 135–145)
Total Bilirubin: 1.3 mg/dL — ABNORMAL HIGH (ref 0.3–1.2)
Total Protein: 6.8 g/dL (ref 6.5–8.1)

## 2018-12-03 LAB — CBC WITH DIFFERENTIAL/PLATELET
Abs Immature Granulocytes: 0.08 10*3/uL — ABNORMAL HIGH (ref 0.00–0.07)
Basophils Absolute: 0 10*3/uL (ref 0.0–0.1)
Basophils Relative: 0 %
Eosinophils Absolute: 0 10*3/uL (ref 0.0–0.5)
Eosinophils Relative: 0 %
HCT: 40.9 % (ref 39.0–52.0)
Hemoglobin: 13.8 g/dL (ref 13.0–17.0)
Immature Granulocytes: 1 %
Lymphocytes Relative: 3 %
Lymphs Abs: 0.6 10*3/uL — ABNORMAL LOW (ref 0.7–4.0)
MCH: 31.5 pg (ref 26.0–34.0)
MCHC: 33.7 g/dL (ref 30.0–36.0)
MCV: 93.4 fL (ref 80.0–100.0)
Monocytes Absolute: 1.1 10*3/uL — ABNORMAL HIGH (ref 0.1–1.0)
Monocytes Relative: 7 %
Neutro Abs: 15.2 10*3/uL — ABNORMAL HIGH (ref 1.7–7.7)
Neutrophils Relative %: 89 %
Platelets: 289 10*3/uL (ref 150–400)
RBC: 4.38 MIL/uL (ref 4.22–5.81)
RDW: 13.3 % (ref 11.5–15.5)
WBC: 17 10*3/uL — ABNORMAL HIGH (ref 4.0–10.5)
nRBC: 0 % (ref 0.0–0.2)

## 2018-12-03 LAB — SARS CORONAVIRUS 2 (TAT 6-24 HRS): SARS Coronavirus 2: NEGATIVE

## 2018-12-03 MED ORDER — METOPROLOL TARTRATE 5 MG/5ML IV SOLN: 5.0000 mg | Freq: Four times a day (QID) | INTRAVENOUS | Status: DC | PRN

## 2018-12-03 MED ORDER — MORPHINE SULFATE (PF) 2 MG/ML IV SOLN
1.0000 mg | INTRAVENOUS | Status: DC | PRN
  Filled 2018-12-03 (×2): qty 1

## 2018-12-03 MED ORDER — ONDANSETRON 4 MG PO TBDP: 4.0000 mg | ORAL_TABLET | Freq: Four times a day (QID) | ORAL | Status: DC | PRN

## 2018-12-03 MED ORDER — PANTOPRAZOLE SODIUM 40 MG IV SOLR
40.0000 mg | Freq: Every day | INTRAVENOUS | Status: DC
  Administered 2018-12-03 – 2018-12-06 (×4): 40 mg via INTRAVENOUS
  Filled 2018-12-03 (×4): qty 40

## 2018-12-03 MED ORDER — SODIUM CHLORIDE 0.9 % IV SOLN
INTRAVENOUS | Status: DC
  Administered 2018-12-03 – 2018-12-05 (×5): via INTRAVENOUS

## 2018-12-03 MED ORDER — SODIUM CHLORIDE 0.9 % IV BOLUS
1000.0000 mL | Freq: Once | INTRAVENOUS | Status: AC
  Administered 2018-12-03: 1000 mL via INTRAVENOUS

## 2018-12-03 MED ORDER — ONDANSETRON HCL 4 MG/2ML IJ SOLN
4.0000 mg | Freq: Once | INTRAMUSCULAR | Status: AC
  Administered 2018-12-03: 4 mg via INTRAVENOUS
  Filled 2018-12-03: qty 2

## 2018-12-03 MED ORDER — SODIUM CHLORIDE 0.9 % IV SOLN
Freq: Once | INTRAVENOUS | Status: AC
  Administered 2018-12-03: 14:00:00 via INTRAVENOUS

## 2018-12-03 MED ORDER — ENOXAPARIN SODIUM 40 MG/0.4ML ~~LOC~~ SOLN
40.0000 mg | SUBCUTANEOUS | Status: DC
  Administered 2018-12-03 – 2018-12-06 (×4): 40 mg via SUBCUTANEOUS
  Filled 2018-12-03 (×4): qty 0.4

## 2018-12-03 MED ORDER — IOPAMIDOL (ISOVUE-300) INJECTION 61%
80.0000 mL | Freq: Once | INTRAVENOUS | Status: AC | PRN
  Administered 2018-12-03: 80 mL via INTRAVENOUS

## 2018-12-03 MED ORDER — ONDANSETRON HCL 4 MG/2ML IJ SOLN: 4.0000 mg | Freq: Four times a day (QID) | INTRAMUSCULAR | Status: DC | PRN

## 2018-12-03 MED ORDER — BISACODYL 10 MG RE SUPP
10.0000 mg | Freq: Once | RECTAL | Status: AC
  Administered 2018-12-03: 10 mg via RECTAL
  Filled 2018-12-03: qty 1

## 2018-12-03 NOTE — ED Notes (Signed)
ED TO INPATIENT HANDOFF REPORT  ED Nurse Name and Phone #: Celene Squibb RN  S Name/Age/Gender Mario Palmer 78 y.o. male Room/Bed: 058C/058C  Code Status   Code Status: Full Code  Home/SNF/Other Home Patient oriented to: self, place, time and situation Is this baseline? Yes   Triage Complete: Triage complete  Chief Complaint vomiting  Triage Note Patient had bilateral hernia repair last Thursday and developed vomiting last night. Was directed to ED for further evaluation   Allergies No Known Allergies  Level of Care/Admitting Diagnosis ED Disposition    ED Disposition Condition Comment   Admit  Hospital Area: [place] [[id number]]  Level of Care: Med-Surg [16]  Covid Evaluation: Asymptomatic Screening Protocol (No Symptoms)  [facility]: [id number]  Admitting Physician: [place], [place]  Attending Physician: Coralie Keens [[id number]]  Bed request comments: 6N  PT Class (Do Not Modify): Observation [104]  PT Acc Code (Do Not Modify): Observation [10022]       B Medical/Surgery History Past Medical History:  Diagnosis Date  . Arthritis   . Cancer ([place])    skin cancer  . Dysrhythmia    afib  . GERD (gastroesophageal reflux disease)   . High cholesterol   . History of kidney stones   . Hx of echocardiogram 12/2010   EF 55%, There is Stage 1 diastolic dyfunction. Normal LV filling pressure. Aortic sclerosis with mild AI, mild TR  . Hypertension   . Palpitation    with ecotopy  . Sleep apnea    Past Surgical History:  Procedure Laterality Date  . CARDIOVERSION N/A 09/12/2016   Procedure: CARDIOVERSION;  Surgeon: Troy Sine, MD;  Location: [facility] ENDOSCOPY;  Service: Cardiovascular;  Laterality: N/A;  . EYE SURGERY Bilateral    cataract  . INGUINAL HERNIA REPAIR Left 11/28/2018   Procedure: LAPAROSCOPIC  LEFT INGUINAL HERNIA WITH MESH;  Surgeon: Coralie Keens, MD;  Location: [place];  Service:  General;  Laterality: Left;  . MOHS SURGERY Left    skin cancer remover from left side  . RETINAL DETACHMENT SURGERY Right   . TONSILLECTOMY    . UMBILICAL HERNIA REPAIR N/A 11/28/2018   Procedure: OPEN UMBILICAL HERNIA REPAIR WITH MESH;  Surgeon: Coralie Keens, MD;  Location: [place];  Service: General;  Laterality: N/A;     A IV Location/Drains/Wounds Patient Lines/Drains/Airways Status   Active Line/Drains/Airways    Name:   Placement date:   Placement time:   Site:   Days:   Peripheral IV 12/03/18 Right Antecubital   12/03/18    1105    Antecubital   less than 1   Incision (Closed) 11/28/18 Abdomen   11/28/18    0936     5   Incision - 3 Ports Abdomen Umbilicus Lower;Mid;Medial Lower;Medial   11/28/18    0905     5          Intake/Output Last 24 hours  Intake/Output Summary (Last 24 hours) at 12/03/2018 1458 Last data filed at 12/03/2018 1157 Gross per 24 hour  Intake 1000 ml  Output -  Net 1000 ml    Labs/Imaging Results for orders placed or performed during the hospital encounter of 12/03/18 (from the past 48 hour(s))  CBC with Differential     Status: Abnormal   Collection Time: 12/03/18 11:06 AM  Result Value Ref Range   WBC 17.0 (H) 4.0 - 10.5 K/uL   RBC 4.38 4.22 - 5.81 MIL/uL   Hemoglobin 13.8  13.0 - 17.0 g/dL   HCT 40.9 39.0 - 52.0 %   MCV 93.4 80.0 - 100.0 fL   MCH 31.5 26.0 - 34.0 pg   MCHC 33.7 30.0 - 36.0 g/dL   RDW 13.3 11.5 - 15.5 %   Platelets 289 150 - 400 K/uL   nRBC 0.0 0.0 - 0.2 %   Neutrophils Relative % 89 %   Neutro Abs 15.2 (H) 1.7 - 7.7 K/uL   Lymphocytes Relative 3 %   Lymphs Abs 0.6 (L) 0.7 - 4.0 K/uL   Monocytes Relative 7 %   Monocytes Absolute 1.1 (H) 0.1 - 1.0 K/uL   Eosinophils Relative 0 %   Eosinophils Absolute 0.0 0.0 - 0.5 K/uL   Basophils Relative 0 %   Basophils Absolute 0.0 0.0 - 0.1 K/uL   Immature Granulocytes 1 %   Abs Immature Granulocytes 0.08 (H) 0.00 - 0.07 K/uL    Comment: Performed at [facility] [address] [place], [place] [address]  Comprehensive metabolic panel     Status: Abnormal   Collection Time: 12/03/18 11:06 AM  Result Value Ref Range   Sodium 135 135 - 145 mmol/L   Potassium 4.4 3.5 - 5.1 mmol/L   Chloride 95 (L) 98 - 111 mmol/L   CO2 27 22 - 32 mmol/L   Glucose, Bld 136 (H) 70 - 99 mg/dL   BUN 22 8 - 23 mg/dL   Creatinine, Ser 1.81 (H) 0.61 - 1.24 mg/dL   Calcium 9.8 8.9 - 10.3 mg/dL   Total Protein 6.8 6.5 - 8.1 g/dL   Albumin 3.6 3.5 - 5.0 g/dL   AST 18 15 - 41 U/L   ALT 18 0 - 44 U/L   Alkaline Phosphatase 64 38 - 126 U/L   Total Bilirubin 1.3 (H) 0.3 - 1.2 mg/dL   GFR calc non Af Amer 35 (L) >60 mL/min   GFR calc Af Amer 40 (L) >60 mL/min   Anion gap 13 5 - 15    Comment: Performed at [facility] [address] [address] [place], [place] [address]   Ct Abdomen Pelvis W Contrast  Result Date: 12/03/2018 CLINICAL DATA:  Generalized abdominal pain. Recent repair of umbilical and left inguinal hernia. EXAM: CT ABDOMEN AND PELVIS WITH CONTRAST TECHNIQUE: Multidetector CT imaging of the abdomen and pelvis was performed using the standard protocol following bolus administration of intravenous contrast. CONTRAST:  23mL ISOVUE-300 IOPAMIDOL (ISOVUE-300) INJECTION 61% COMPARISON:  None. FINDINGS: Lower chest: Mild bibasilar atelectasis. Calcified granuloma in the right lower lobe. Coronary artery calcification. Hepatobiliary: Normal Pancreas: Normal Spleen: Normal Adrenals/Urinary Tract: Right adrenal gland is normal. Left adrenal gland shows hyperplasia and or small adenomas. Bilateral renal cysts. Nonobstructing 2 mm stone in the lower pole of left kidney. No hydronephrosis. No bladder abnormality. Stomach/Bowel: Fluid distension of the stomach. Dilated proximal to mid small intestine with collapsed distal small intestine consistent with small-bowel obstruction. Question adhesion in the region of the umbilical hernia repair. Apparent recent surgery at the left groin region.  There does appear to be bilateral inguinal hernias containing fat. No sign of herniated or incarcerated bowel. Vascular/Lymphatic: Aortic atherosclerosis. No aneurysm. IVC is normal. No retroperitoneal adenopathy. Reproductive: Normal Other: Some persistent intraperitoneal air presumably subsequent to the previous surgery. Musculoskeletal: Ordinary lumbar degenerative changes. IMPRESSION: Small bowel obstruction pattern. Question adhesion at the site of the umbilical hernia repair. The patient does appear also to have some persistent herniated fat in both inguinal regions,  but there is evidence of previous surgery in the left inguinal region. No evidence of herniated or incarcerated small bowel in those regions. Small amount of intraperitoneal air, presumably subsequent to the previous surgery. Electronically Signed   By: Nelson Chimes M.D.   On: 12/03/2018 12:47    Pending Labs Unresulted Labs (From admission, onward)    Start     Ordered   12/10/18 0500  Creatinine, serum  (enoxaparin (LOVENOX)    CrCl >/= 30 ml/min)  Weekly,   R    Comments: while on enoxaparin therapy    12/03/18 1346   12/04/18 [id number]  Basic metabolic panel  Tomorrow morning,   R     12/03/18 1346   12/04/18 0500  CBC  Tomorrow morning,   R     12/03/18 1346   12/03/18 1345  Urinalysis, Routine w reflex microscopic  Once,   STAT     12/03/18 1346   12/03/18 1342  CBC  (enoxaparin (LOVENOX)    CrCl >/= 30 ml/min)  Once,   STAT    Comments: Baseline for enoxaparin therapy IF NOT ALREADY DRAWN.  Notify MD if PLT < 100 K.    12/03/18 1346   12/03/18 1342  Creatinine, serum  (enoxaparin (LOVENOX)    CrCl >/= 30 ml/min)  Once,   STAT    Comments: Baseline for enoxaparin therapy IF NOT ALREADY DRAWN.    12/03/18 1346   12/03/18 1256  SARS CORONAVIRUS 2 (TAT 6-24 HRS) Nasopharyngeal Nasopharyngeal Swab  (Asymptomatic/Tier 2 Patients Labs)  Once,   STAT    Question Answer Comment  Is this test for diagnosis or screening Screening    Symptomatic for COVID-19 as defined by CDC No   Hospitalized for COVID-19 No   Admitted to ICU for COVID-19 No   Previously tested for COVID-19 Yes   Resident in a congregate (group) care setting No   Employed in healthcare setting No      12/03/18 1255          Vitals/Pain Today's Vitals   12/03/18 1215 12/03/18 1415 12/03/18 1430 12/03/18 1445  BP: 126/82 123/73 (!) 153/87 125/72  Pulse: 69 69    Resp:      Temp:      TempSrc:      SpO2: 95% 97%    PainSc:        Isolation Precautions No active isolations  Medications Medications  enoxaparin (LOVENOX) injection 40 mg (has no administration in time range)  0.9 %  sodium chloride infusion (has no administration in time range)  morphine 2 MG/ML injection 1-2 mg (has no administration in time range)  ondansetron (ZOFRAN-ODT) disintegrating tablet 4 mg (has no administration in time range)    Or  ondansetron (ZOFRAN) injection 4 mg (has no administration in time range)  bisacodyl (DULCOLAX) suppository 10 mg (has no administration in time range)  pantoprazole (PROTONIX) injection 40 mg (has no administration in time range)  metoprolol tartrate (LOPRESSOR) injection 5 mg (has no administration in time range)  ondansetron (ZOFRAN) injection 4 mg (4 mg Intravenous Given 12/03/18 1122)  sodium chloride 0.9 % bolus 1,000 mL (0 mLs Intravenous Stopped 12/03/18 1157)  iopamidol (ISOVUE-300) 61 % injection 80 mL (80 mLs Intravenous Contrast Given 12/03/18 1236)  0.9 %  sodium chloride infusion ( Intravenous New Bag/Given 12/03/18 1336)    Mobility walks Low fall risk   Focused Assessments Ileus   R Recommendations: See Admitting Provider Note  Report given to:  Additional Notes:

## 2018-12-03 NOTE — H&P (Signed)
[facility] Admission Note  Mario Palmer 02-15-1941  [id number].    Requesting MD: Roslynn Amble Chief Complaint/Reason for Consult: sbo HPI:  Patient is a 78 year old male who recently underwent inguinal and ventral hernia repairs on 11/28/18 with Dr. Ninfa Linden. Presented to ED today with bloating and nausea and vomiting. Ate some oatmeal with peaches yesterday and since then has progressively worsened nausea and emesis x4. Emesis was NBNB. Patient is passing some flatus but reports it is very little. Had a small firm BM yesterday, denies bloody or melenotic stools. Reports more abdominal tightness/discomfort than pain. Denies fever, chest pain, SOB. Does report some dysuria.   In the ED - VSS, WBC elevated at 17, labs otherwise fairly unremarkable. CT scan showed possible sbo.   ROS: Review of Systems  Constitutional: Negative for chills and fever.  Respiratory: Negative for cough, shortness of breath and wheezing.   Cardiovascular: Negative for chest pain, palpitations and leg swelling.  Gastrointestinal: Positive for abdominal pain (more discomfort), nausea and vomiting. Negative for blood in stool, constipation, diarrhea and melena.  Genitourinary: Positive for dysuria. Negative for frequency and urgency.  All other systems reviewed and are negative.   No family history on file.  Past Medical History:  Diagnosis Date  . Arthritis   . Cancer ([place])    skin cancer  . Dysrhythmia    afib  . GERD (gastroesophageal reflux disease)   . High cholesterol   . History of kidney stones   . Hx of echocardiogram 12/2010   EF 55%, There is Stage 1 diastolic dyfunction. Normal LV filling pressure. Aortic sclerosis with mild AI, mild TR  . Hypertension   . Palpitation    with ecotopy  . Sleep apnea     Past Surgical History:  Procedure Laterality Date  . CARDIOVERSION N/A 09/12/2016   Procedure: CARDIOVERSION;  Surgeon: Troy Sine, MD;  Location: [facility] ENDOSCOPY;   Service: Cardiovascular;  Laterality: N/A;  . EYE SURGERY Bilateral    cataract  . INGUINAL HERNIA REPAIR Left 11/28/2018   Procedure: LAPAROSCOPIC  LEFT INGUINAL HERNIA WITH MESH;  Surgeon: Coralie Keens, MD;  Location: [facility];  Service: General;  Laterality: Left;  . MOHS SURGERY Left    skin cancer remover from left side  . RETINAL DETACHMENT SURGERY Right   . TONSILLECTOMY    . UMBILICAL HERNIA REPAIR N/A 11/28/2018   Procedure: OPEN UMBILICAL HERNIA REPAIR WITH MESH;  Surgeon: Coralie Keens, MD;  Location: [facility];  Service: General;  Laterality: N/A;    Social History:  reports that he has quit smoking. His smoking use included cigarettes. He has never used smokeless tobacco. He reports that he does not drink alcohol or use drugs.  Allergies: No Known Allergies  (Not in a hospital admission)   Blood pressure 126/82, pulse 69, temperature 97.7 F (36.5 C), temperature source Oral, resp. rate 16, SpO2 95 %. Physical Exam: Physical Exam Constitutional:      General: He is not in acute distress.    Appearance: He is well-developed and overweight. He is not toxic-appearing.  HENT:     Head: Normocephalic and atraumatic.     Right Ear: External ear normal.     Left Ear: External ear normal.     Nose: Nose normal.  Eyes:     General: Lids are normal. No scleral icterus.    Extraocular Movements: Extraocular movements intact.     Conjunctiva/sclera: Conjunctivae normal.  Neck:     Musculoskeletal:  Normal range of motion and neck supple.  Cardiovascular:     Rate and Rhythm: Normal rate and regular rhythm.     Pulses:          Radial pulses are 2+ on the right side and 2+ on the left side.       Dorsalis pedis pulses are 2+ on the right side and 2+ on the left side.     Heart sounds: Normal heart sounds.  Pulmonary:     Effort: Pulmonary effort is normal.     Breath sounds: Normal breath sounds.  Abdominal:     General: Bowel sounds are normal. There is distension.      Palpations: Abdomen is soft. There is no hepatomegaly or splenomegaly.     Tenderness: There is abdominal tenderness (mild) in the epigastric area. There is no guarding or rebound.     Comments: Incision c/d/i with mild ecchymosis   Musculoskeletal:     Comments: ROM grossly intact in all 4 extremities  Skin:    General: Skin is warm and dry.  Neurological:     Mental Status: He is alert and oriented to person, place, and time.  Psychiatric:        Attention and Perception: Attention and perception normal.        Mood and Affect: Mood and affect normal.        Speech: Speech normal.        Behavior: Behavior normal. Behavior is cooperative.     Results for orders placed or performed during the hospital encounter of 12/03/18 (from the past 48 hour(s))  CBC with Differential     Status: Abnormal   Collection Time: 12/03/18 11:06 AM  Result Value Ref Range   WBC 17.0 (H) 4.0 - 10.5 K/uL   RBC 4.38 4.22 - 5.81 MIL/uL   Hemoglobin 13.8 13.0 - 17.0 g/dL   HCT 40.9 39.0 - 52.0 %   MCV 93.4 80.0 - 100.0 fL   MCH 31.5 26.0 - 34.0 pg   MCHC 33.7 30.0 - 36.0 g/dL   RDW 13.3 11.5 - 15.5 %   Platelets 289 150 - 400 K/uL   nRBC 0.0 0.0 - 0.2 %   Neutrophils Relative % 89 %   Neutro Abs 15.2 (H) 1.7 - 7.7 K/uL   Lymphocytes Relative 3 %   Lymphs Abs 0.6 (L) 0.7 - 4.0 K/uL   Monocytes Relative 7 %   Monocytes Absolute 1.1 (H) 0.1 - 1.0 K/uL   Eosinophils Relative 0 %   Eosinophils Absolute 0.0 0.0 - 0.5 K/uL   Basophils Relative 0 %   Basophils Absolute 0.0 0.0 - 0.1 K/uL   Immature Granulocytes 1 %   Abs Immature Granulocytes 0.08 (H) 0.00 - 0.07 K/uL    Comment: Performed at [facility], [address], [place], [place] [address]  Comprehensive metabolic panel     Status: Abnormal   Collection Time: 12/03/18 11:06 AM  Result Value Ref Range   Sodium 135 135 - 145 mmol/L   Potassium 4.4 3.5 - 5.1 mmol/L   Chloride 95 (L) 98 - 111 mmol/L   CO2 27 22 - 32 mmol/L   Glucose,  Bld 136 (H) 70 - 99 mg/dL   BUN 22 8 - 23 mg/dL   Creatinine, Ser 1.81 (H) 0.61 - 1.24 mg/dL   Calcium 9.8 8.9 - 10.3 mg/dL   Total Protein 6.8 6.5 - 8.1 g/dL   Albumin 3.6 3.5 - 5.0  g/dL   AST 18 15 - 41 U/L   ALT 18 0 - 44 U/L   Alkaline Phosphatase 64 38 - 126 U/L   Total Bilirubin 1.3 (H) 0.3 - 1.2 mg/dL   GFR calc non Af Amer 35 (L) >60 mL/min   GFR calc Af Amer 40 (L) >60 mL/min   Anion gap 13 5 - 15    Comment: Performed at [facility] [address] [place], [place] [address]   Ct Abdomen Pelvis W Contrast  Result Date: 12/03/2018 CLINICAL DATA:  Generalized abdominal pain. Recent repair of umbilical and left inguinal hernia. EXAM: CT ABDOMEN AND PELVIS WITH CONTRAST TECHNIQUE: Multidetector CT imaging of the abdomen and pelvis was performed using the standard protocol following bolus administration of intravenous contrast. CONTRAST:  71mL ISOVUE-300 IOPAMIDOL (ISOVUE-300) INJECTION 61% COMPARISON:  None. FINDINGS: Lower chest: Mild bibasilar atelectasis. Calcified granuloma in the right lower lobe. Coronary artery calcification. Hepatobiliary: Normal Pancreas: Normal Spleen: Normal Adrenals/Urinary Tract: Right adrenal gland is normal. Left adrenal gland shows hyperplasia and or small adenomas. Bilateral renal cysts. Nonobstructing 2 mm stone in the lower pole of left kidney. No hydronephrosis. No bladder abnormality. Stomach/Bowel: Fluid distension of the stomach. Dilated proximal to mid small intestine with collapsed distal small intestine consistent with small-bowel obstruction. Question adhesion in the region of the umbilical hernia repair. Apparent recent surgery at the left groin region. There does appear to be bilateral inguinal hernias containing fat. No sign of herniated or incarcerated bowel. Vascular/Lymphatic: Aortic atherosclerosis. No aneurysm. IVC is normal. No retroperitoneal adenopathy. Reproductive: Normal Other: Some persistent intraperitoneal air presumably  subsequent to the previous surgery. Musculoskeletal: Ordinary lumbar degenerative changes. IMPRESSION: Small bowel obstruction pattern. Question adhesion at the site of the umbilical hernia repair. The patient does appear also to have some persistent herniated fat in both inguinal regions, but there is evidence of previous surgery in the left inguinal region. No evidence of herniated or incarcerated small bowel in those regions. Small amount of intraperitoneal air, presumably subsequent to the previous surgery. Electronically Signed   By: Nelson Chimes M.D.   On: 12/03/2018 12:47      Assessment/Plan HTN GERD OSA  S/p laparoscopic left inguinal hernia repair with mesh and open umbilical hernia repair with mesh 11/28/18 Dr. Ninfa Linden Post-operative ileus - POD#5 - admit for observation - place NGT and repeat films tomorrow AM - Can clamp NGT to allow ambulation - try suppository   FEN: NPO, IVF; NGT to LIWS VTE: SCDs, lovenox ID: no current abx  Brigid Re, [facility] 12/03/2018, 1:47 PM Pager: [place]: [phone]

## 2018-12-03 NOTE — ED Notes (Signed)
Patient return from CT

## 2018-12-03 NOTE — ED Notes (Signed)
Patient transported to CT 

## 2018-12-03 NOTE — ED Notes (Signed)
Pt had large amounts of green emesis and states he feels improved.

## 2018-12-03 NOTE — ED Provider Notes (Signed)
[facility] EMERGENCY DEPARTMENT Provider Note   CSN: [id number] Arrival date & time: 12/03/18  1019     History   Chief Complaint Chief Complaint  Patient presents with   hernia surgery/ vomiting    HPI DECIMUS GLUCKMAN is a 78 y.o. male.  Patient had recent hernia repair left inguinal hernia, umbilical hernia.  Since that time he has had progressive nausea, yesterday developed vomiting and worsening nausea, has continued today.  Reports that he has had couple small bowel movements.  No diarrhea.  Nonbilious, nonbloody vomiting.  No fever, denies any associated abdominal pain.    HPI  Past Medical History:  Diagnosis Date   Arthritis    Cancer ([place])    skin cancer   Dysrhythmia    afib   GERD (gastroesophageal reflux disease)    High cholesterol    History of kidney stones    Hx of echocardiogram 12/2010   EF 55%, There is Stage 1 diastolic dyfunction. Normal LV filling pressure. Aortic sclerosis with mild AI, mild TR   Hypertension    Palpitation    with ecotopy   Sleep apnea     Patient Active Problem List   Diagnosis Date Noted   Ileus, postoperative ([place]) 12/03/2018   Non-seasonal allergic rhinitis 05/20/2018   Bilateral hearing loss 05/20/2018   Localized skin eruption 05/20/2018   Mixed hyperlipidemia 03/15/2018   Elevated PSA 03/15/2018   HTN (hypertension) 06/17/2013   Vitamin D insufficiency 06/17/2013   OSA on CPAP 02/06/2013   Hyperlipidemia 02/06/2013   GERD (gastroesophageal reflux disease) 02/06/2013   Traumatic closed fracture of C2 vertebra with minimal displacement ([place]) 06/03/2011    Past Surgical History:  Procedure Laterality Date   CARDIOVERSION N/A 09/12/2016   Procedure: CARDIOVERSION;  Surgeon: Troy Sine, MD;  Location: [facility];  Service: Cardiovascular;  Laterality: N/A;   EYE SURGERY Bilateral    cataract   INGUINAL HERNIA REPAIR Left 11/28/2018   Procedure: LAPAROSCOPIC   LEFT INGUINAL HERNIA WITH MESH;  Surgeon: Coralie Keens, MD;  Location: [facility];  Service: General;  Laterality: Left;   MOHS SURGERY Left    skin cancer remover from left side   RETINAL DETACHMENT SURGERY Right    TONSILLECTOMY     UMBILICAL HERNIA REPAIR N/A 11/28/2018   Procedure: OPEN UMBILICAL HERNIA REPAIR WITH MESH;  Surgeon: Coralie Keens, MD;  Location: [facility];  Service: General;  Laterality: N/A;        Home Medications    Prior to Admission medications   Medication Sig Start Date End Date Taking? Authorizing Provider  amiodarone (PACERONE) 200 MG tablet TAKE TWO TABLETS BY MOUTH DAILY  Patient taking differently: Take 200 mg by mouth daily.  06/26/18  Yes Troy Sine, MD  atorvastatin (LIPITOR) 20 MG tablet TAKE ONE TABLET BY MOUTH ONE TIME DAILY  Patient taking differently: Take 20 mg by mouth daily at 6 PM.  11/29/18  Yes Troy Sine, MD  BYSTOLIC 10 MG tablet TAKE ONE TABLET BY MOUTH ONE TIME DAILY  Patient taking differently: Take 5 mg by mouth daily.  04/29/18  Yes [name], FNP  Cholecalciferol (VITAMIN D) 2000 UNITS tablet Take 2,000 Units by mouth daily.   Yes [provider]  diltiazem (CARDIZEM CD) 120 MG 24 hr capsule Take 1 capsule (120 mg total) by mouth at bedtime. 11/28/17  Yes Troy Sine, MD  ELIQUIS 5 MG TABS tablet TAKE ONE TABLET BY MOUTH TWICE DAILY  Patient taking differently: Take 5 mg by mouth 2 (two) times daily.  08/19/18  Yes Troy Sine, MD  lisinopril (ZESTRIL) 40 MG tablet TAKE ONE TABLET BY MOUTH ONE TIME DAILY  Patient taking differently: Take 40 mg by mouth daily.  10/11/18  Yes Glendale Chard, MD  Multiple Vitamins-Minerals (MULTIVITAMIN ADULT PO) Take 1 tablet by mouth daily.   Yes [provider]  NON FORMULARY CPAP therapy   Yes [provider]  Saccharomyces boulardii (PROBIOTIC) 250 MG CAPS Take 1 capsule by mouth daily.   Yes [provider]  sodium chloride (OCEAN) 0.65 % SOLN  nasal spray Place 1 spray into both nostrils as needed for congestion.   Yes [provider]  spironolactone (ALDACTONE) 25 MG tablet TAKE ONE TABLET BY MOUTH ONE TIME DAILY  Patient taking differently: Take 25 mg by mouth daily.  06/26/18  Yes Troy Sine, MD  tamsulosin (FLOMAX) 0.4 MG CAPS capsule Take 0.4 mg by mouth daily. 07/10/18  Yes [provider]  traMADol (ULTRAM) 50 MG tablet Take 1 tablet (50 mg total) by mouth every 6 (six) hours as needed for moderate pain or severe pain. 11/28/18  Yes Coralie Keens, MD    Family History No family history on file.  Social History Social History   Tobacco Use   Smoking status: Former Smoker    Types: Cigarettes   Smokeless tobacco: Never Used   Tobacco comment: quit smoking in 1982  Substance Use Topics   Alcohol use: No   Drug use: No     Allergies   Patient has no known allergies.   Review of Systems Review of Systems  Constitutional: Negative for chills and fever.  HENT: Negative for ear pain and sore throat.   Eyes: Negative for pain and visual disturbance.  Respiratory: Negative for cough and shortness of breath.   Cardiovascular: Negative for chest pain and palpitations.  Gastrointestinal: Positive for nausea and vomiting. Negative for abdominal pain.  Genitourinary: Negative for dysuria and hematuria.  Musculoskeletal: Negative for arthralgias and back pain.  Skin: Negative for color change and rash.  Neurological: Negative for seizures and syncope.  All other systems reviewed and are negative.    Physical Exam Updated Vital Signs BP 114/65 (BP Location: Left Arm)    Pulse 68    Temp 98.7 F (37.1 C) (Oral)    Resp 20    SpO2 96%   Physical Exam Vitals signs and nursing note reviewed.  Constitutional:      Appearance: He is well-developed.  HENT:     Head: Normocephalic and atraumatic.  Eyes:     Conjunctiva/sclera: Conjunctivae normal.  Neck:     Musculoskeletal: Neck supple.   Cardiovascular:     Rate and Rhythm: Normal rate and regular rhythm.     Heart sounds: No murmur.  Pulmonary:     Effort: Pulmonary effort is normal. No respiratory distress.     Breath sounds: Normal breath sounds.  Abdominal:     Palpations: Abdomen is soft.     Comments: Mild generalized tenderness to palpation throughout abdomen, umbilical incision site appears C/D/I, no overlying erythema, no underlying induration  Skin:    General: Skin is warm and dry.  Neurological:     Mental Status: He is alert.      ED Treatments / Results  Labs (all labs ordered are listed, but only abnormal results are displayed) Labs Reviewed  CBC WITH DIFFERENTIAL/PLATELET - Abnormal; Notable for the following  components:      Result Value   WBC 17.0 (*)    Neutro Abs 15.2 (*)    Lymphs Abs 0.6 (*)    Monocytes Absolute 1.1 (*)    Abs Immature Granulocytes 0.08 (*)    All other components within normal limits  COMPREHENSIVE METABOLIC PANEL - Abnormal; Notable for the following components:   Chloride 95 (*)    Glucose, Bld 136 (*)    Creatinine, Ser 1.81 (*)    Total Bilirubin 1.3 (*)    GFR calc non Af Amer 35 (*)    GFR calc Af Amer 40 (*)    All other components within normal limits  SARS CORONAVIRUS 2 (TAT 6-24 HRS)  URINALYSIS, ROUTINE W REFLEX MICROSCOPIC    EKG None  Radiology Ct Abdomen Pelvis W Contrast  Result Date: 12/03/2018 CLINICAL DATA:  Generalized abdominal pain. Recent repair of umbilical and left inguinal hernia. EXAM: CT ABDOMEN AND PELVIS WITH CONTRAST TECHNIQUE: Multidetector CT imaging of the abdomen and pelvis was performed using the standard protocol following bolus administration of intravenous contrast. CONTRAST:  61mL ISOVUE-300 IOPAMIDOL (ISOVUE-300) INJECTION 61% COMPARISON:  None. FINDINGS: Lower chest: Mild bibasilar atelectasis. Calcified granuloma in the right lower lobe. Coronary artery calcification. Hepatobiliary: Normal Pancreas: Normal Spleen: Normal  Adrenals/Urinary Tract: Right adrenal gland is normal. Left adrenal gland shows hyperplasia and or small adenomas. Bilateral renal cysts. Nonobstructing 2 mm stone in the lower pole of left kidney. No hydronephrosis. No bladder abnormality. Stomach/Bowel: Fluid distension of the stomach. Dilated proximal to mid small intestine with collapsed distal small intestine consistent with small-bowel obstruction. Question adhesion in the region of the umbilical hernia repair. Apparent recent surgery at the left groin region. There does appear to be bilateral inguinal hernias containing fat. No sign of herniated or incarcerated bowel. Vascular/Lymphatic: Aortic atherosclerosis. No aneurysm. IVC is normal. No retroperitoneal adenopathy. Reproductive: Normal Other: Some persistent intraperitoneal air presumably subsequent to the previous surgery. Musculoskeletal: Ordinary lumbar degenerative changes. IMPRESSION: Small bowel obstruction pattern. Question adhesion at the site of the umbilical hernia repair. The patient does appear also to have some persistent herniated fat in both inguinal regions, but there is evidence of previous surgery in the left inguinal region. No evidence of herniated or incarcerated small bowel in those regions. Small amount of intraperitoneal air, presumably subsequent to the previous surgery. Electronically Signed   By: Nelson Chimes M.D.   On: 12/03/2018 12:47    Procedures Procedures (including critical care time)  Medications Ordered in ED Medications  enoxaparin (LOVENOX) injection 40 mg (has no administration in time range)  0.9 %  sodium chloride infusion ( Intravenous New Bag/Given 12/03/18 1624)  morphine 2 MG/ML injection 1-2 mg (has no administration in time range)  ondansetron (ZOFRAN-ODT) disintegrating tablet 4 mg (has no administration in time range)    Or  ondansetron (ZOFRAN) injection 4 mg (has no administration in time range)  bisacodyl (DULCOLAX) suppository 10 mg (has no  administration in time range)  pantoprazole (PROTONIX) injection 40 mg (has no administration in time range)  metoprolol tartrate (LOPRESSOR) injection 5 mg (has no administration in time range)  ondansetron (ZOFRAN) injection 4 mg (4 mg Intravenous Given 12/03/18 1122)  sodium chloride 0.9 % bolus 1,000 mL (0 mLs Intravenous Stopped 12/03/18 1157)  iopamidol (ISOVUE-300) 61 % injection 80 mL (80 mLs Intravenous Contrast Given 12/03/18 1236)  0.9 %  sodium chloride infusion ( Intravenous Stopped 12/03/18 1544)     Initial Impression / Assessment  and Plan / ED Course  I have reviewed the triage vital signs and the nursing notes.  Pertinent labs & imaging results that were available during my care of the patient were reviewed by me and considered in my medical decision making (see chart for details).  Clinical Course as of Dec 02 1641  Tue Dec 03, 2018  1130 Performed initial assessment, will get labs, CT scan and likely general surgery consultation   [RD]  1304 Discussed with general surgery PA who will come evaluate patient, likely ileus, likely admission for observation   [RD]    Clinical Course User Index [RD] Lucrezia Starch, MD      78 year old presented hernia repair presenting with vomiting.  Exam with noted abdominal tenderness to palpation.  CT scan is read for SBO, surgery team felt likely ileus.  They will admit for further monitoring.  [facility] accepting.   Final Clinical Impressions(s) / ED Diagnoses   Final diagnoses:  Small bowel obstruction (HCC)  Leukocytosis, unspecified type    ED Discharge Orders    None       Lucrezia Starch, MD 12/03/18 1643

## 2018-12-03 NOTE — ED Triage Notes (Signed)
Patient had bilateral hernia repair last Thursday and developed vomiting last night. Was directed to ED for further evaluation

## 2018-12-04 ENCOUNTER — Observation Stay (HOSPITAL_COMMUNITY): Payer: Medicare Other

## 2018-12-04 DIAGNOSIS — K567 Ileus, unspecified: Secondary | ICD-10-CM | POA: Diagnosis not present

## 2018-12-04 LAB — CBC
HCT: 35.7 % — ABNORMAL LOW (ref 39.0–52.0)
Hemoglobin: 11.8 g/dL — ABNORMAL LOW (ref 13.0–17.0)
MCH: 31.1 pg (ref 26.0–34.0)
MCHC: 33.1 g/dL (ref 30.0–36.0)
MCV: 93.9 fL (ref 80.0–100.0)
Platelets: 222 10*3/uL (ref 150–400)
RBC: 3.8 MIL/uL — ABNORMAL LOW (ref 4.22–5.81)
RDW: 13.5 % (ref 11.5–15.5)
WBC: 12.8 10*3/uL — ABNORMAL HIGH (ref 4.0–10.5)
nRBC: 0 % (ref 0.0–0.2)

## 2018-12-04 LAB — BASIC METABOLIC PANEL
Anion gap: 9 (ref 5–15)
BUN: 24 mg/dL — ABNORMAL HIGH (ref 8–23)
CO2: 24 mmol/L (ref 22–32)
Calcium: 8.4 mg/dL — ABNORMAL LOW (ref 8.9–10.3)
Chloride: 102 mmol/L (ref 98–111)
Creatinine, Ser: 1.44 mg/dL — ABNORMAL HIGH (ref 0.61–1.24)
GFR calc Af Amer: 53 mL/min — ABNORMAL LOW (ref >60)
GFR calc non Af Amer: 46 mL/min — ABNORMAL LOW (ref >60)
Glucose, Bld: 96 mg/dL (ref 70–99)
Potassium: 4.2 mmol/L (ref 3.5–5.1)
Sodium: 135 mmol/L (ref 135–145)

## 2018-12-04 NOTE — Progress Notes (Signed)
   Subjective/Chief Complaint: He reports passing flatus and several BM's Denies any abdominal pain   Objective: Vital signs in last 24 hours: Temp:  [97.7 F (36.5 C)-98.7 F (37.1 C)] 98.7 F (37.1 C) (09/02 0548) Pulse Rate:  [59-75] 59 (09/02 0548) Resp:  [14-20] 17 (09/02 0548) BP: (103-153)/(62-87) 103/62 (09/02 0548) SpO2:  [94 %-99 %] 97 % (09/02 0548) Last BM Date: 12/03/18  Intake/Output from previous day: 09/01 0701 - 09/02 0700 In: 2637.9 [I.V.:1637.9; IV Piggyback:1000] Out: 650 [Emesis/NG output:650] Intake/Output this shift: No intake/output data recorded.  Exam: Awake and alert Abdomen much softer, less distension, non-tender  Lab Results:  Recent Labs    12/03/18 1106 12/04/18 0224  WBC 17.0* 12.8*  HGB 13.8 11.8*  HCT 40.9 35.7*  PLT 289 222   BMET Recent Labs    12/03/18 1106 12/04/18 0224  NA 135 135  K 4.4 4.2  CL 95* 102  CO2 27 24  GLUCOSE 136* 96  BUN 22 24*  CREATININE 1.81* 1.44*  CALCIUM 9.8 8.4*   PT/INR No results for input(s): LABPROT, INR in the last 72 hours. ABG No results for input(s): PHART, HCO3 in the last 72 hours.  Invalid input(s): PCO2, PO2  Studies/Results: Ct Abdomen Pelvis W Contrast  Result Date: 12/03/2018 CLINICAL DATA:  Generalized abdominal pain. Recent repair of umbilical and left inguinal hernia. EXAM: CT ABDOMEN AND PELVIS WITH CONTRAST TECHNIQUE: Multidetector CT imaging of the abdomen and pelvis was performed using the standard protocol following bolus administration of intravenous contrast. CONTRAST:  79mL ISOVUE-300 IOPAMIDOL (ISOVUE-300) INJECTION 61% COMPARISON:  None. FINDINGS: Lower chest: Mild bibasilar atelectasis. Calcified granuloma in the right lower lobe. Coronary artery calcification. Hepatobiliary: Normal Pancreas: Normal Spleen: Normal Adrenals/Urinary Tract: Right adrenal gland is normal. Left adrenal gland shows hyperplasia and or small adenomas. Bilateral renal cysts. Nonobstructing  2 mm stone in the lower pole of left kidney. No hydronephrosis. No bladder abnormality. Stomach/Bowel: Fluid distension of the stomach. Dilated proximal to mid small intestine with collapsed distal small intestine consistent with small-bowel obstruction. Question adhesion in the region of the umbilical hernia repair. Apparent recent surgery at the left groin region. There does appear to be bilateral inguinal hernias containing fat. No sign of herniated or incarcerated bowel. Vascular/Lymphatic: Aortic atherosclerosis. No aneurysm. IVC is normal. No retroperitoneal adenopathy. Reproductive: Normal Other: Some persistent intraperitoneal air presumably subsequent to the previous surgery. Musculoskeletal: Ordinary lumbar degenerative changes. IMPRESSION: Small bowel obstruction pattern. Question adhesion at the site of the umbilical hernia repair. The patient does appear also to have some persistent herniated fat in both inguinal regions, but there is evidence of previous surgery in the left inguinal region. No evidence of herniated or incarcerated small bowel in those regions. Small amount of intraperitoneal air, presumably subsequent to the previous surgery. Electronically Signed   By: Nelson Chimes M.D.   On: 12/03/2018 12:47    Anti-infectives: Anti-infectives (From admission, onward)   None      Assessment/Plan: Post op ileus vs SBO  Plain abd xray this morning shows gas throughout the colon but still some small bowel dilation. Labs are overall improved Will clamp NG and try clear liquids  LOS: 0 days    Coralie Keens 12/04/2018

## 2018-12-05 DIAGNOSIS — Z20828 Contact with and (suspected) exposure to other viral communicable diseases: Secondary | ICD-10-CM | POA: Diagnosis present

## 2018-12-05 DIAGNOSIS — Z7901 Long term (current) use of anticoagulants: Secondary | ICD-10-CM | POA: Diagnosis not present

## 2018-12-05 DIAGNOSIS — E782 Mixed hyperlipidemia: Secondary | ICD-10-CM | POA: Diagnosis present

## 2018-12-05 DIAGNOSIS — J3089 Other allergic rhinitis: Secondary | ICD-10-CM | POA: Diagnosis present

## 2018-12-05 DIAGNOSIS — K567 Ileus, unspecified: Secondary | ICD-10-CM | POA: Diagnosis not present

## 2018-12-05 DIAGNOSIS — K56609 Unspecified intestinal obstruction, unspecified as to partial versus complete obstruction: Secondary | ICD-10-CM | POA: Diagnosis present

## 2018-12-05 DIAGNOSIS — I10 Essential (primary) hypertension: Secondary | ICD-10-CM | POA: Diagnosis present

## 2018-12-05 DIAGNOSIS — G4730 Sleep apnea, unspecified: Secondary | ICD-10-CM | POA: Diagnosis present

## 2018-12-05 DIAGNOSIS — Z87891 Personal history of nicotine dependence: Secondary | ICD-10-CM | POA: Diagnosis not present

## 2018-12-05 DIAGNOSIS — Z85828 Personal history of other malignant neoplasm of skin: Secondary | ICD-10-CM | POA: Diagnosis not present

## 2018-12-05 DIAGNOSIS — H918X3 Other specified hearing loss, bilateral: Secondary | ICD-10-CM | POA: Diagnosis present

## 2018-12-05 DIAGNOSIS — G4733 Obstructive sleep apnea (adult) (pediatric): Secondary | ICD-10-CM | POA: Diagnosis present

## 2018-12-05 DIAGNOSIS — K219 Gastro-esophageal reflux disease without esophagitis: Secondary | ICD-10-CM | POA: Diagnosis present

## 2018-12-05 DIAGNOSIS — Z79891 Long term (current) use of opiate analgesic: Secondary | ICD-10-CM | POA: Diagnosis not present

## 2018-12-05 DIAGNOSIS — Z79899 Other long term (current) drug therapy: Secondary | ICD-10-CM | POA: Diagnosis not present

## 2018-12-05 HISTORY — DX: Ileus, unspecified: K56.7

## 2018-12-05 MED ORDER — METOCLOPRAMIDE HCL 5 MG/ML IJ SOLN
5.0000 mg | Freq: Three times a day (TID) | INTRAMUSCULAR | Status: DC
  Administered 2018-12-05 – 2018-12-06 (×2): 5 mg via INTRAVENOUS
  Filled 2018-12-05: qty 2

## 2018-12-05 MED ORDER — MENTHOL 3 MG MT LOZG
1.0000 | LOZENGE | OROMUCOSAL | Status: DC | PRN
  Administered 2018-12-05: 23:00:00 3 mg via ORAL
  Filled 2018-12-05: qty 9

## 2018-12-05 NOTE — Progress Notes (Signed)
Subjective/Chief Complaint: Comfortable this morning Denies nausea, bloating, or abdominal pain Had another small BM   Objective: Vital signs in last 24 hours: Temp:  [98 F (36.7 C)-98.9 F (37.2 C)] 98.9 F (37.2 C) (09/03 0605) Pulse Rate:  [60-69] 60 (09/03 0605) Resp:  [17-19] 17 (09/03 0605) BP: (117-137)/(67-78) 117/71 (09/03 0605) SpO2:  [97 %-98 %] 97 % (09/03 0605) Last BM Date: 12/03/18  Intake/Output from previous day: 09/02 0701 - 09/03 0700 In: 537 [P.O.:537] Out: 1850 [Urine:1850] Intake/Output this shift: Total I/O In: -  Out: 1850 [Urine:1850]  Exam: Awake and alert Abdomen soft, non-tender, non-distended  Lab Results:  Recent Labs    12/03/18 1106 12/04/18 0224  WBC 17.0* 12.8*  HGB 13.8 11.8*  HCT 40.9 35.7*  PLT 289 222   BMET Recent Labs    12/03/18 1106 12/04/18 0224  NA 135 135  K 4.4 4.2  CL 95* 102  CO2 27 24  GLUCOSE 136* 96  BUN 22 24*  CREATININE 1.81* 1.44*  CALCIUM 9.8 8.4*   PT/INR No results for input(s): LABPROT, INR in the last 72 hours. ABG No results for input(s): PHART, HCO3 in the last 72 hours.  Invalid input(s): PCO2, PO2  Studies/Results: Dg Abd 1 View  Result Date: 12/04/2018 CLINICAL DATA:  Ileus.  Status post hernia repair. EXAM: ABDOMEN - 1 VIEW COMPARISON:  CT of the abdomen and pelvis 12/03/2018 FINDINGS: Dilated loops of small bowel are improved.  NG tube is in place. IMPRESSION: Improving dilation small bowel loops with NG tube in place. Electronically Signed   By: San Morelle M.D.   On: 12/04/2018 08:48   Ct Abdomen Pelvis W Contrast  Result Date: 12/03/2018 CLINICAL DATA:  Generalized abdominal pain. Recent repair of umbilical and left inguinal hernia. EXAM: CT ABDOMEN AND PELVIS WITH CONTRAST TECHNIQUE: Multidetector CT imaging of the abdomen and pelvis was performed using the standard protocol following bolus administration of intravenous contrast. CONTRAST:  77mL ISOVUE-300  IOPAMIDOL (ISOVUE-300) INJECTION 61% COMPARISON:  None. FINDINGS: Lower chest: Mild bibasilar atelectasis. Calcified granuloma in the right lower lobe. Coronary artery calcification. Hepatobiliary: Normal Pancreas: Normal Spleen: Normal Adrenals/Urinary Tract: Right adrenal gland is normal. Left adrenal gland shows hyperplasia and or small adenomas. Bilateral renal cysts. Nonobstructing 2 mm stone in the lower pole of left kidney. No hydronephrosis. No bladder abnormality. Stomach/Bowel: Fluid distension of the stomach. Dilated proximal to mid small intestine with collapsed distal small intestine consistent with small-bowel obstruction. Question adhesion in the region of the umbilical hernia repair. Apparent recent surgery at the left groin region. There does appear to be bilateral inguinal hernias containing fat. No sign of herniated or incarcerated bowel. Vascular/Lymphatic: Aortic atherosclerosis. No aneurysm. IVC is normal. No retroperitoneal adenopathy. Reproductive: Normal Other: Some persistent intraperitoneal air presumably subsequent to the previous surgery. Musculoskeletal: Ordinary lumbar degenerative changes. IMPRESSION: Small bowel obstruction pattern. Question adhesion at the site of the umbilical hernia repair. The patient does appear also to have some persistent herniated fat in both inguinal regions, but there is evidence of previous surgery in the left inguinal region. No evidence of herniated or incarcerated small bowel in those regions. Small amount of intraperitoneal air, presumably subsequent to the previous surgery. Electronically Signed   By: Nelson Chimes M.D.   On: 12/03/2018 12:47    Anti-infectives: Anti-infectives (From admission, onward)   None      Assessment/Plan: Post op ileus  Saline lock IV Full liquid diet  LOS: 0 days  Mario Palmer 12/05/2018

## 2018-12-06 ENCOUNTER — Inpatient Hospital Stay (HOSPITAL_COMMUNITY): Payer: Medicare Other

## 2018-12-06 MED ORDER — POTASSIUM CHLORIDE IN NACL 20-0.9 MEQ/L-% IV SOLN
INTRAVENOUS | Status: DC
  Administered 2018-12-06 – 2018-12-07 (×2): via INTRAVENOUS
  Filled 2018-12-06 (×2): qty 1000

## 2018-12-06 MED ORDER — METOCLOPRAMIDE HCL 5 MG/ML IJ SOLN
10.0000 mg | Freq: Three times a day (TID) | INTRAMUSCULAR | Status: AC
  Administered 2018-12-06 – 2018-12-07 (×3): 10 mg via INTRAVENOUS
  Filled 2018-12-06 (×3): qty 2

## 2018-12-06 MED ORDER — BISACODYL 10 MG RE SUPP
10.0000 mg | Freq: Once | RECTAL | Status: AC
  Administered 2018-12-06: 10:00:00 10 mg via RECTAL
  Filled 2018-12-06: qty 1

## 2018-12-06 NOTE — Progress Notes (Signed)
   Subjective/Chief Complaint: Reports feeling bloated today No BM or flatus over night   Objective: Vital signs in last 24 hours: Temp:  [98.4 F (36.9 C)-98.9 F (37.2 C)] 98.5 F (36.9 C) (09/04 [id number]) Pulse Rate:  [54-61] 61 (09/04 0614) Resp:  [18] 18 (09/04 0614) BP: (101-111)/(58-72) 111/72 (09/04 0614) SpO2:  [97 %-100 %] 100 % (09/04 0614) Last BM Date: 12/05/18  Intake/Output from previous day: 09/03 0701 - 09/04 0700 In: -  Out: 1600 [Urine:1600] Intake/Output this shift: No intake/output data recorded.  Exam: Abdomen more distended this morning. Non-tender Lab Results:  Recent Labs    12/03/18 1106 12/04/18 0224  WBC 17.0* 12.8*  HGB 13.8 11.8*  HCT 40.9 35.7*  PLT 289 222   BMET Recent Labs    12/03/18 1106 12/04/18 0224  NA 135 135  K 4.4 4.2  CL 95* 102  CO2 27 24  GLUCOSE 136* 96  BUN 22 24*  CREATININE 1.81* 1.44*  CALCIUM 9.8 8.4*   PT/INR No results for input(s): LABPROT, INR in the last 72 hours. ABG No results for input(s): PHART, HCO3 in the last 72 hours.  Invalid input(s): PCO2, PO2  Studies/Results: No results found.  Anti-infectives: Anti-infectives (From admission, onward)   None      Assessment/Plan: Ileus  Will give suppos and repeat abdominal xrays this morning.  Coralie Keens MD 12/06/2018

## 2018-12-06 NOTE — Plan of Care (Signed)

## 2018-12-06 NOTE — Progress Notes (Signed)
Patient ID: Mario Palmer, male   DOB: 25-Aug-1940, 78 y.o.   MRN: [id number]  He had another BM with the suppos and denies any abdominal pain or nausea  The xrays today showed further decrease in the small bowel dilation and still gas throughout the colon.  Will keep today and can hopefully discharge tomorrow

## 2018-12-07 MED ORDER — ALUM & MAG HYDROXIDE-SIMETH 200-200-20 MG/5ML PO SUSP
30.0000 mL | ORAL | Status: DC | PRN
  Administered 2018-12-07: 30 mL via ORAL
  Filled 2018-12-07: qty 30

## 2018-12-07 NOTE — Discharge Summary (Signed)
Physician Discharge Summary  Patient ID: Mario Palmer MRN: [id number] DOB/AGE: [birth year] 78 y.o.  Admit date: 12/03/2018 Discharge date: 12/07/2018  Admission Diagnoses: Patient Active Problem List   Diagnosis Date Noted  . Ileus ([place]) 12/05/2018  . Ileus, postoperative ([place]) 12/03/2018  . Non-seasonal allergic rhinitis 05/20/2018  . Bilateral hearing loss 05/20/2018  . Localized skin eruption 05/20/2018  . Mixed hyperlipidemia 03/15/2018  . Elevated PSA 03/15/2018  . HTN (hypertension) 06/17/2013  . Vitamin D insufficiency 06/17/2013  . OSA on CPAP 02/06/2013  . Hyperlipidemia 02/06/2013  . GERD (gastroesophageal reflux disease) 02/06/2013  . Traumatic closed fracture of C2 vertebra with minimal displacement ([place]) 06/03/2011    Discharge Diagnoses:  Active Problems:   Ileus, postoperative ([facility])  and same as above.  Discharged Condition: stable  Hospital Course:  Pt was admitted from the ED 9/1.  He had lap LIH and UHR both with mesh 11/28/2018.  He developed significant bloating and nausea.  He got NGT decompression.  He started passing gas and had gas throughout colon on xray the next day.  His NGT was clamped and he was started on clear liquids.  His NGT was removed.  He got suppositories and started having bowel movements.  He is doing much better this AM.  He still has some bloating, but no n/v and is passing gas and having bowel movements.  His pain control is good.  He is looking forward to going home.    Consults: None  Significant Diagnostic Studies: labs: Creatinine coming down to 1.4 (baseline is elevated, range from 1.1 to 1.9).    Treatments: NGT, hydration  Discharge Exam: Blood pressure 111/70, pulse (!) 58, temperature 98.6 F (37 C), temperature source Oral, resp. rate 18, SpO2 97 %. General appearance: alert, cooperative and no distress Resp: breathing comfortably Cardio: regular GI: soft, non tender.  sl bloated still Extremities: extremities  normal, atraumatic, no cyanosis or edema  Disposition: Discharge disposition: 01-Home or Self Care       Discharge Instructions    Call MD for:  difficulty breathing, headache or visual disturbances   Complete by: As directed    Call MD for:  hives   Complete by: As directed    Call MD for:  persistant nausea and vomiting   Complete by: As directed    Call MD for:  redness, tenderness, or signs of infection (pain, swelling, redness, odor or green/yellow discharge around incision site)   Complete by: As directed    Call MD for:  severe uncontrolled pain   Complete by: As directed    Call MD for:  temperature >100.4   Complete by: As directed    Diet - low sodium heart healthy   Complete by: As directed    Increase activity slowly   Complete by: As directed      Allergies as of 12/07/2018   No Known Allergies     Medication List    TAKE these medications   amiodarone 200 MG tablet Commonly known as: PACERONE TAKE TWO TABLETS BY MOUTH DAILY What changed: how much to take   atorvastatin 20 MG tablet Commonly known as: LIPITOR TAKE ONE TABLET BY MOUTH ONE TIME DAILY What changed: when to take this   Bystolic 10 MG tablet Generic drug: nebivolol TAKE ONE TABLET BY MOUTH ONE TIME DAILY What changed: how much to take   diltiazem 120 MG 24 hr capsule Commonly known as: CARDIZEM CD Take 1 capsule (120 mg total) by mouth  at bedtime.   Eliquis 5 MG Tabs tablet Generic drug: apixaban TAKE ONE TABLET BY MOUTH TWICE DAILY What changed: how much to take   lisinopril 40 MG tablet Commonly known as: ZESTRIL TAKE ONE TABLET BY MOUTH ONE TIME DAILY   MULTIVITAMIN ADULT PO Take 1 tablet by mouth daily.   NON FORMULARY CPAP therapy   Probiotic 250 MG Caps Take 1 capsule by mouth daily.   sodium chloride 0.65 % Soln nasal spray Commonly known as: OCEAN Place 1 spray into both nostrils as needed for congestion.   spironolactone 25 MG tablet Commonly known as:  ALDACTONE TAKE ONE TABLET BY MOUTH ONE TIME DAILY   tamsulosin 0.4 MG Caps capsule Commonly known as: FLOMAX Take 0.4 mg by mouth daily.   traMADol 50 MG tablet Commonly known as: ULTRAM Take 1 tablet (50 mg total) by mouth every 6 (six) hours as needed for moderate pain or severe pain.   Vitamin D 50 MCG (2000 UT) tablet Take 2,000 Units by mouth daily.      Follow-up Information    Coralie Keens, MD. Schedule an appointment as soon as possible for a visit.   Specialty: General Surgery Contact information: [address] [place] [address] [phone]           Signed: Stark Klein 12/07/2018, 8:59 AM

## 2018-12-07 NOTE — Plan of Care (Signed)
  Problem: Pain Managment: Goal: General experience of comfort will improve Outcome: Progressing   Problem: Safety: Goal: Ability to remain free from injury will improve Outcome: Progressing   Problem: Skin Integrity: Goal: Risk for impaired skin integrity will decrease Outcome: Progressing   

## 2018-12-07 NOTE — Discharge Instructions (Signed)
Ileus  Ileus is a condition that happens when the intestines, which are also called bowels, stop working correctly. The intestines are hollow organs that digest food after the food leaves the stomach. These organs are long, muscular tubes that connect the stomach to the rectum. When ileus occurs, the muscular contractions that cause food to move through the intestines do not happen as they normally would. If the intestines stop working, food cannot pass through to get digested. This condition is a serious problem that usually requires hospitalization. It can cause symptoms such as nausea, abdominal pain, and bloating. Ileus can last from a few hours to a few days. What are the causes? This condition may be caused by:  Surgery on the abdomen.  An infection or inflammation in the abdomen. This includes inflammation of the lining of the abdomen (peritonitis).  Infection or inflammation in other parts of the body, such as pneumonia or pancreatitis.  Passage of gallstones or kidney stones.  Damage to the nerves or blood vessels that go to the intestines.  A collection of blood within the abdominal cavity.  Imbalance in the salts in the blood (electrolytes).  Injury to the brain or spinal cord.  Medicines. Many medicines, including strong pain medicines, can cause ileus or make it worse. If the intestines stop working because of a blockage, that is a different condition that is called a bowel obstruction. What are the signs or symptoms? Symptoms of this condition include:  Bloating of the abdomen.  Pain or discomfort in the abdomen.  Poor appetite.  Nausea and vomiting.  Lack of normal bowel sounds, such as "growling" in the stomach. How is this diagnosed? This condition may be diagnosed with:  A physical exam and medical history.  X-rays or a CT scan of the abdomen. You may also have other tests to help find the cause of the condition. How is this treated? This condition may  be treated by:  Resting the intestines until they start to work again. This is often done by: ? Stopping oral intake of food and drink. You will be given fluid through an IV to prevent dehydration. ? Placing a small tube (nasogastric tube or NG tube) that is passed through your nose and into your stomach. The tube is attached to a suction device and keeps the stomach emptied out. This allows the bowels to rest and helps to reduce nausea and vomiting.  Correcting any electrolyte imbalance by giving supplements in the IV fluid.  Stopping any medicines that might make ileus worse.  Treating any condition that may have caused ileus. Follow these instructions at home: Eating and drinking   Follow instructions from your health care provider about: ? What to eat and drink. You may be told to start eating a bland diet. Over time, you may slowly resume a more normal, healthy diet. ? How much to eat and drink. You should eat small meals often and stop eating when you feel full.  Avoid alcohol. General instructions  Take over-the-counter and prescription medicines only as told by your health care provider.  Rest as told by your health care provider.  Avoid sitting for a long time without moving. Get up to take short walks every 1-2 hours. Ask for help if you feel weak or unsteady.  Keep all follow-up visits as told by your health care provider. This is important. Contact a health care provider if:  You have nausea, vomiting, or abdominal discomfort.  You have a fever. Get help  right away if:  You have severe abdominal pain or bloating.  You cannot eat or drink without vomiting. Summary  Ileus is a condition that happens when the intestines, which are also called bowels, stop working correctly.  When ileus occurs, the muscular contractions that cause food to move through the intestines do not happen as they normally would.  Ileus can cause symptoms such as nausea, abdominal pain, and  bloating.  Treatment may involve getting IV fluids and having a nasogastric tube placed to keep your stomach emptied out until the intestines start working again. This information is not intended to replace advice given to you by your health care provider. Make sure you discuss any questions you have with your health care provider. Document Released: 03/23/2003 Document Revised: 07/16/2017 Document Reviewed: 07/16/2017 Elsevier Patient Education  [place].

## 2018-12-07 NOTE — Progress Notes (Signed)
Discharge paper work went over at Mario Palmer removed pt IV  Pt has all belongings  Awaiting transportation

## 2018-12-09 ENCOUNTER — Other Ambulatory Visit: Payer: Self-pay | Admitting: Cardiovascular Disease

## 2018-12-16 ENCOUNTER — Telehealth: Payer: Self-pay

## 2018-12-16 NOTE — Telephone Encounter (Signed)
I called patient to schedule him a hospital f/u and he declined he told me to "tell Doreene Burke that he will see her in nov." [facility]

## 2018-12-30 ENCOUNTER — Other Ambulatory Visit: Payer: Self-pay

## 2018-12-30 ENCOUNTER — Ambulatory Visit (INDEPENDENT_AMBULATORY_CARE_PROVIDER_SITE_OTHER): Payer: Medicare Other | Admitting: Cardiovascular Disease

## 2018-12-30 VITALS — BP 104/60 | HR 55 | Ht 70.0 in | Wt 189.0 lb

## 2018-12-30 DIAGNOSIS — I10 Essential (primary) hypertension: Secondary | ICD-10-CM

## 2018-12-30 DIAGNOSIS — Z7901 Long term (current) use of anticoagulants: Secondary | ICD-10-CM

## 2018-12-30 DIAGNOSIS — I480 Paroxysmal atrial fibrillation: Secondary | ICD-10-CM

## 2018-12-30 DIAGNOSIS — Z79899 Other long term (current) drug therapy: Secondary | ICD-10-CM | POA: Diagnosis not present

## 2018-12-30 DIAGNOSIS — E782 Mixed hyperlipidemia: Secondary | ICD-10-CM

## 2018-12-30 MED ORDER — SPIRONOLACTONE 25 MG PO TABS: 12.5000 mg | ORAL_TABLET | Freq: Every day | ORAL | 3 refills | Status: DC

## 2018-12-30 NOTE — Progress Notes (Signed)
Patient ID: Mario Palmer, male   DOB: 1940-09-11, 78 y.o.   MRN: [id number]    Primary M.D.: Dr. Bryon Lions  HPI: Mario Palmer is a 78 y.o. male who presents for a 6 month follow-up evaluation.  Mario Palmer has a history of hypertension, obstructive sleep apnea on CPAP therapy, GERD, and palpitations with atrial ectopy,. 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 He has history of hyperlipidemia and has been on simvastatin and had intermittently stopped this secondary to arthralgias.  An echo Doppler study in January 2015 revealed a normal systolic function with ejection fraction of 55-60%. There was evidence for mild aortic and mitral insufficiency. His left atrium was moderately dilated.  Mario Palmer was diagnosed with obstructive sleep apnea in 2010. He had mild sleep apnea with events worse in the supine position and during REM sleep. He has been on CPAP therapy since that time and initially he was started on an 8 cm water pressure but also this was later titrated to 10 cm.  He admits to 100% compliance.  His initial machine malfunctioned in 2017 and  He received a new AirSense 10 CPAP unit.  He has noticed significant improvement in this new machine.  Compared to his old one.  He admits to 100% compliance.  He goes to bed between 9:30 9:45 AM and wakes up once around 2:30 to the bathroom and typically sleeps 9-10 hours per night.  A download was obtained from his new machine from 05/07/2015 through 06/05/2015.  This revealed 100% compliance.  He is averaging 9 hours and 53 minutes per night.  He is set at a 10 cm water pressure.  His AHI was excellent at 1.1.  He uses a fullface mask.  There was no leak.    When I saw him in March 2018 his ECG demonstrated atrial fibrillation.  He was in this of questionable duration.  At that time, I initiated anticoagulation witheliquis  5 mg twice a day.  I discontinued amlodipine and started Cardizem CD 180 mg.  He underwent an echo  Doppler study on 06/23/2016 which showed an EF of 55-60%.  He did not have wall motion abnormalities.  There was mild aortic sclerosis with mildAR and mild focal calcification of the anterior mitral valve leaflet with trivial MR.  His right ventricle is mildly dilated.  His left atrium was severely dilated and measured 64 mm parasternally.  I initiated therapy with amiodarone as an antiarrhythmic agent, with the greatest likelihood for pharmacologic success.  I decrease Bystolic to 5 mg.  He started amiodarone at 200 mg first week and ultimately titrated this to 200 mg twice a day.  He has felt improved with control his ventricular rate.  He notices that his blood pressure is slightly increased on the reduced dose of Bystolic.  He admitted to 100% compliance with CPAP therapy.  He underwent successful DC cardioversion by me on 08/18/2016 with recurrence of sinus rhythm.  He has noticed improved energy since restoration of sinus rhythm.  He is unaware of any significant ectopy but does note a rare palpitation.  He has been exercising and pedals on a stationary bike for at least 40 minutes without chest pain or shortness of breath.  When  saw him in September 2018 he was unaware of recurrent atrial fibrillation.  He does admit to occasional palpitations at night.  Apparently, he had been on Bystolic 10 mg but inadvertently when he called his primary physician Bystolic  was renewed at 5 mg instead of the 10 mg dose.  The palpitations seem to have occurred on the reduced dose.  I last saw him in December and resume bystolic at 10 mg.  This has improved his palpitations and blood pressure.  He continues to use CPAP with 100% compliance.   A download from August 21 through 12/20/2016 revealed 100% of use with an average sleep duration of 9 hours and 11 minutes.  His AHI is excellent at 1.7 on a 10 cm set pressure.  His amiodarone dose was reduced in December and he now is on 200 mg daily, in addition to Cartia 240 mg,  lisinopril 40 mg in addition to spironolactone 25 mg daily for hypertension.  He called the office with complaints of his tongue becoming exceedingly dry.  After several hours of CPAP use.  He has a fullface mask.  He breathes through his mouth and remotely had used a nasal mask was switched to fullface mask due to oral venting.  He had complained of a very dry mouth, making it difficult for him to continue to sleep.  He was worked in and seen as an add-on one month ago.  At that time, we readjusted his medication.  We had subsequent telephone conversations with additional adjustment.  He went to his DME company and they adjusted his tube temperature to 70.    He underwent colonoscopy and endoscopy by Dr. Earlean Shawl and was found to have 9 polyps.  Apparently the biopsy was benign.  When I last saw him early April 2019 he was continuing to have difficulty with his new CPAP machine.  Subsequently he saw [facility] as his DME company.  He was given a chinstrap.  Addition, he was advised by his daughter-in-law's dentist to use Somnifix to assist his mouth breathing.  He feels since implementing this mouth taped, he has noticed marked improvement in his sleep.  He is now sleeping at least 9 hours.  His sleep is restorative.  He has noticed his blood pressure now is significantly more controlled.  He brought with him blood pressure readings from home and his systolic blood pressures have ranged from in the low to mid 90s to the 120s.  He has been unaware of any recurrent episodes of atrial fibrillation.  He denies episodes of chest pain.  He continues to be on anticoagulation for PAF.  He had follow-up laboratory in April which showed a total cholesterol 188 triglycerides 190 LDL 93 HDL 57.  PSA was increased at 6.2.  Creatinine was 1.32.  He had previously seen Dr. Bryon Lions for primary care but apparently her office is no longer taking [employer].    When I saw him in August 2019, he was  maintaining sinus rhythm on amiodarone 200 mg daily in addition to Bystolic 5 mg.  He also was on lisinopril 40 mg in addition to spironolactone for hypertension.  He had been on low-dose simvastatin and particularly with its interaction with Cardizem and an LDL of 93 I suggested he discontinue simvastatin and started him on atorvastatin 20 mg.  He tells me he had retinal surgery in January.  He had undergone excision of a squamous cell cancer from his abdomen and was told of clear margins.  He fell on his face after he tripped on his shoelace resulting in significant ecchymoses to his nose and under his eyes bilaterally.  He continues to use CPAP.  During that evaluation, I recommended he  discontinue aspirin but continue Eliquis.  Over the past 6 months, he had undergone  laparoscopic double hernia surgery on November 28, 2018 by Dr. Ninfa Linden.  He subsequently required hospitalization for development of a postoperative ileus.  He denies any chest pain.  He denies any awareness of abnormal rhythm.  He does have some leg cramping of his legs at night.  He continues to use CPAP.  He presents for reevaluation.  Past Medical History:  Diagnosis Date  . Arthritis   . Cancer ([place])    skin cancer  . Dysrhythmia    afib  . GERD (gastroesophageal reflux disease)   . High cholesterol   . History of kidney stones   . Hx of echocardiogram 12/2010   EF 55%, There is Stage 1 diastolic dyfunction. Normal LV filling pressure. Aortic sclerosis with mild AI, mild TR  . Hypertension   . Palpitation    with ecotopy  . Sleep apnea     Past Surgical History:  Procedure Laterality Date  . CARDIOVERSION N/A 09/12/2016   Procedure: CARDIOVERSION;  Surgeon: Troy Sine, MD;  Location: [facility] ENDOSCOPY;  Service: Cardiovascular;  Laterality: N/A;  . EYE SURGERY Bilateral    cataract  . INGUINAL HERNIA REPAIR Left 11/28/2018   Procedure: LAPAROSCOPIC  LEFT INGUINAL HERNIA WITH MESH;  Surgeon: Coralie Keens, MD;   Location: [facility];  Service: General;  Laterality: Left;  . MOHS SURGERY Left    skin cancer remover from left side  . RETINAL DETACHMENT SURGERY Right   . TONSILLECTOMY    . UMBILICAL HERNIA REPAIR N/A 11/28/2018   Procedure: OPEN UMBILICAL HERNIA REPAIR WITH MESH;  Surgeon: Coralie Keens, MD;  Location: [facility];  Service: General;  Laterality: N/A;    No Known Allergies  Current Outpatient Medications  Medication Sig Dispense Refill  . amiodarone (PACERONE) 200 MG tablet TAKE TWO TABLETS BY MOUTH DAILY  (Patient taking differently: Take 200 mg by mouth daily. ) 180 tablet 1  . atorvastatin (LIPITOR) 20 MG tablet TAKE ONE TABLET BY MOUTH ONE TIME DAILY  (Patient taking differently: Take 20 mg by mouth daily at 6 PM. ) 90 tablet 0  . BYSTOLIC 10 MG tablet TAKE ONE TABLET BY MOUTH ONE TIME DAILY  (Patient taking differently: Take 5 mg by mouth daily. ) 90 tablet 1  . CARTIA XT 120 MG 24 hr capsule TAKE ONE CAPSULE BY MOUTH AT BEDTIME  90 capsule 0  . Cholecalciferol (VITAMIN D) 2000 UNITS tablet Take 2,000 Units by mouth daily.    Marland Kitchen ELIQUIS 5 MG TABS tablet TAKE ONE TABLET BY MOUTH TWICE DAILY  (Patient taking differently: Take 5 mg by mouth 2 (two) times daily. ) 180 tablet 1  . lisinopril (ZESTRIL) 40 MG tablet TAKE ONE TABLET BY MOUTH ONE TIME DAILY  (Patient taking differently: Take 40 mg by mouth daily. ) 90 tablet 0  . Multiple Vitamins-Minerals (MULTIVITAMIN ADULT PO) Take 1 tablet by mouth daily.    . NON FORMULARY CPAP therapy    . Saccharomyces boulardii (PROBIOTIC) 250 MG CAPS Take 1 capsule by mouth daily.    . sodium chloride (OCEAN) 0.65 % SOLN nasal spray Place 1 spray into both nostrils as needed for congestion.    . tamsulosin (FLOMAX) 0.4 MG CAPS capsule Take 0.4 mg by mouth daily.    . traMADol (ULTRAM) 50 MG tablet Take 1 tablet (50 mg total) by mouth every 6 (six) hours as needed for moderate pain or severe pain.  30 tablet 0  . spironolactone (ALDACTONE) 25 MG tablet Take  0.5 tablets (12.5 mg total) by mouth daily. 90 tablet 3   No current facility-administered medications for this visit.     Social History   Socioeconomic History  . Marital status: Married    Spouse name: Not on file  . Number of children: Not on file  . Years of education: Not on file  . Highest education level: Not on file  Occupational History  . Occupation: retired  Scientific laboratory technician  . Financial resource strain: Not hard at all  . Food insecurity    Worry: Never true    Inability: Never true  . Transportation needs    Medical: No    Non-medical: Not on file  Tobacco Use  . Smoking status: Former Smoker    Types: Cigarettes  . Smokeless tobacco: Never Used  . Tobacco comment: quit smoking in 1982  Substance and Sexual Activity  . Alcohol use: No  . Drug use: No  . Sexual activity: Not Currently  Lifestyle  . Physical activity    Days per week: 7 days    Minutes per session: 30 min  . Stress: Not at all  Relationships  . Social Herbalist on phone: Not on file    Gets together: Not on file    Attends religious service: Not on file    Active member of club or organization: Not on file    Attends meetings of clubs or organizations: Not on file    Relationship status: Not on file  . Intimate partner violence    Fear of current or ex partner: No    Emotionally abused: No    Physically abused: No    Forced sexual activity: No  Other Topics Concern  . Not on file  Social History Narrative  . Not on file    No family history on file.  Both parents are deceased.  Social history is notable in that he is married; 2 children and 2 grandchildren. 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 There is no tobacco or alcohol use.  ROS General: Negative; No fevers, chills, or night sweats;  HEENT: Negative; No changes in vision or hearing, sinus congestion, difficulty swallowing Pulmonary: Negative; No cough, wheezing, shortness of breath, hemoptysis  Cardiovascular:  Negative; No chest pain, presyncope, syncope, GI: Negative; No nausea, vomiting, diarrhea, or abdominal pain GU: Positive for mild PSA elevation for which he was treated with an antibiotic.  Repeat laboratory will be forthcoming by Dr. Bryon Lions.  No dysuria, hematuria, or difficulty voiding Musculoskeletal: Negative; no myalgias, joint pain, or weakness Hematologic/Oncology: Negative; no easy bruising, bleeding Endocrine: Negative; no heat/cold intolerance; no diabetes Neuro: Negative; no changes in balance, headaches Skin: Negative; No rashes or skin lesions Psychiatric: Negative; No behavioral problems, depression Sleep: Positive for obstructive sleep apnea on CPAP therapy.  Occasional leg cramps at night the urge to move without definitive restless leg syndrome; no snoring, daytime sleepiness, hypersomnolence, bruxism, hypnogognic hallucinations, no cataplexy Other comprehensive 14 point system review is negative.  PE: BP 104/60   Pulse (!) 55   Ht '5\' 10"'$  (1.778 m)   Wt 189 lb (85.7 kg)   SpO2 96%   BMI 27.12 kg/m    Repeat blood pressure by me was 124/70 supine and 116/70 standing  Wt Readings from Last 3 Encounters:  09/22/16 192 lb (87.1 kg)  09/12/16 198 lb (89.8 kg)  08/18/16 194  lb (88 kg)   General: Alert, oriented, no distress.  Appears younger than stated age Skin: normal turgor, no rashes, warm and dry HEENT: Normocephalic, atraumatic. Pupils equal round and reactive to light; sclera anicteric; extraocular muscles intact;  Nose without nasal septal hypertrophy Mouth/Parynx benign; Mallinpatti scale Neck: No JVD, no carotid bruits; normal carotid upstroke Lungs: clear to ausculatation and percussion; no wheezing or rales Chest wall: without tenderness to palpitation Heart: PMI not displaced, RRR, s1 s2 normal, 1/6 systolic murmur, no diastolic murmur, no rubs, gallops, thrills, or heaves Abdomen: soft, nontender; no hepatosplenomehaly, BS+;  abdominal aorta nontender and not dilated by palpation. Back: no CVA tenderness Pulses 2+ Musculoskeletal: full range of motion, normal strength, no joint deformities Extremities: no clubbing cyanosis or edema, Homan's sign negative  Neurologic: grossly nonfocal; Cranial nerves grossly wnl Psychologic: Normal mood and affect   ECG (independently read by me): Sinus bradycardia at 55 bpm.  First-degree AV block with a.PR interval 230 msec.  Early transition.  March 2020ECG (independently read by me): Normal sinus rhythm at 62 bpm.  PR interval 200 ms.  QTc interval 444 ms.  No ectopy.  November 28, 2017 ECG (independently read by me): Sinus Bradycardia at 51, degree AV block.  No ectopy.  April 2019 ECG (independently read by me): Sinus bradycardia at 49 bpm with mild sinus arrhythmia.  First-degree AV block with a paratubal 2 to 2 ms.  No ST segment changes.  06/01/2017 ECG (independently read by me):Sinus bradycardia 58 bpm.  First-degree AV block.  05/11/2017 ECG (independently read by me): Sinus bradycardia at 67 bpm.  First degree AV block with a PR interval 214 ms.  QTC normal 434 ms.  December 2018 ECG (independently read by me):  Normal sinus rhythm at 63 bpm. PR interval 280m.  QTc nterval 466 ms. No significant ST-T changes.  September 2018 ECG (independently read by me): Sinus bradycardia 52 bpm.  QTC increased at 487 ms.  No CMV ST-T change.  June 2018 ECG (independently read by me): Normal sinus rhythm at 62 bpm.  Left axis deviation.  QTc interval 479 ms.  No significant ST-T changes  May 2018 ECG (independently read by me): atrial flib  /flutter with coarse laboratory wave.  07/13/2016 ECG (independently read by me): Atrial fibrillation with a ventricular rate in the 50s.  Mild RV conduction delay.  March 2018 ECG (independently read by me): Atrial fibrillation at 63 bpm.  QTc interval 427 ms.  March 2016 ECG (independently read by me): Sinus bradycardia 59 bpm.  Mild RV  conduction delay.  Normal intervals.  March 2015 ECG: Normal sinus rhythm at 60 beats per minute. No ectopy. Nonspecific T changes in lead 3.  02/04/2013 ECG: Sinus bradycardia with PACs.  In the office today I obtained a new download from October 29, 2017 through November 27, 2017.  During this time he has been using some to fix on a daily basis.  Compliance is 100%.  He is averaging 9 hours and 19 minutes of sleep per night.  At a 10 cm set pressure, AHI is excellent at 1.2.  There is no leak.  LABS:  BMP Latest Ref Rng & Units 12/04/2018 12/03/2018 11/26/2018  Glucose 70 - 99 mg/dL 96 136(H) 104(H)  BUN 8 - 23 mg/dL 24(H) 22 29(H)  Creatinine 0.61 - 1.24 mg/dL 1.44(H) 1.81(H) 1.82(H)  BUN/Creat Ratio 10 - 24 - - -  Sodium 135 - 145 mmol/L 135 135 134(L)  Potassium 3.5 -  5.1 mmol/L 4.2 4.4 4.8  Chloride 98 - 111 mmol/L 102 95(L) 101  CO2 22 - 32 mmol/L '24 27 24  '$ Calcium 8.9 - 10.3 mg/dL 8.4(L) 9.8 9.4    Hepatic Function Latest Ref Rng & Units 12/03/2018 03/15/2018 06/07/2016  Total Protein 6.5 - 8.1 g/dL 6.8 7.0 7.1  Albumin 3.5 - 5.0 g/dL 3.6 4.3 4.2  AST 15 - 41 U/L '18 20 18  '$ ALT 0 - 44 U/L '18 26 18  '$ Alk Phosphatase 38 - 126 U/L 64 62 54  Total Bilirubin 0.3 - 1.2 mg/dL 1.3(H) 0.8 1.3(H)   CBC Latest Ref Rng & Units 12/04/2018 12/03/2018 11/21/2018  WBC 4.0 - 10.5 K/uL 12.8(H) 17.0(H) 7.4  Hemoglobin 13.0 - 17.0 g/dL 11.8(L) 13.8 13.1  Hematocrit 39.0 - 52.0 % 35.7(L) 40.9 39.9  Platelets 150 - 400 K/uL 222 289 219   Lab Results  Component Value Date   MCV 93.9 12/04/2018   MCV 93.4 12/03/2018   MCV 94.5 11/21/2018   Lab Results  Component Value Date   TSH 2.23 06/07/2016     BNP No results found for: PROBNP  Lipid Panel     Component Value Date/Time   CHOL 178 03/15/2018 0903   TRIG 128 03/15/2018 0903   HDL 63 03/15/2018 0903   CHOLHDL 2.8 03/15/2018 0903   CHOLHDL 3.0 06/07/2016 1030   VLDL 28 06/07/2016 1030   LDLCALC 89 03/15/2018 0903    RADIOLOGY: No results  found.  IMPRESSION:  1. Essential hypertension   2. Medication management   3. Paroxysmal atrial fibrillation (HCC)   4. Anticoagulated   5. Mixed hyperlipidemia     ASSESSMENT AND PLAN: Mario Palmer is a young appearing 78 year old  gentleman who has a history of hypertension, obstructive sleep apnea, palpitations with documented atrial ectopy, and hyperlipidemia.  He had developed atrial fibrillation/flutter and after titration of amiodarone, ultimately underwent successful DC cardioversion on 08/18/2016 with resolution of atrial flutter and restoration of sinus rhythm.  He has been on chronic eliquis for anticoagulation and denies bleeding.   He had held his Eliquis for 48 hours prior to his colonoscopy which revealed 9 polyps which was successfully biopsied.  After 48 hours following his procedure he again then resumed Eliquis.  When I last saw him, I recommended discontinuance of aspirin therapy and he is continued to be on Eliquis.  His blood pressure today remained stable on his regimen consisting of Bystolic 10 mg daily lisinopril 40 mg, Cartia XT 120 mg at bedtime and spironolactone 25 mg.  He has been noticing leg cramps particularly at night with the urge to move.  He has not had any breakthrough atrial fibrillation.  I reviewed laboratory from his hospitalization with his ileus.  At that time there was renal insufficiency and creatinine had increased to 1.82 and improved to 1.44 at discharge.  I have suggested he try reducing his spironolactone to just 12.5 mg daily.  He has been very aggressively riding a stationary bike at least 2 to hours per day and several different sessions.  This may also be contributing to some of his leg cramping nocturnally.  I will recheck a be met to make certain electrolytes are stable.  He continues to be on atorvastatin 20 mg for hyperlipidemia.  I will recheck a be met today.  I obtained a download and he continues to meet compliance regarding his CPAP  and at a 10 cm set pressure AHI is 3.5 on a download  from August 29 through December 29, 2018.  I will see him in 6 months for reevaluation  Time spent: 25 minutes  Troy Sine, MD, [facility]  01/01/2019 6:23 PM

## 2018-12-30 NOTE — Patient Instructions (Addendum)
Medication Instructions:  DECREASE SPIRONOLACTONE TO 12.5 MG ( 1/2 A TABLET OF 25 MG ) DAILY   If you need a refill on your cardiac medications before your next appointment, please call your pharmacy.   Lab work: [employer] IN 2 WEEKS -  If you have labs (blood work) drawn today and your tests are completely normal, you will receive your results only by: Marland Kitchen MyChart Message (if you have MyChart) OR . A paper copy in the mail If you have any lab test that is abnormal or we need to change your treatment, we will call you to review the results.  Testing/Procedures: NOT NEEDED  Follow-Up: At [facility], you and your health needs are our priority.  As part of our continuing mission to provide you with exceptional heart care, we have created designated Provider Care Teams.  These Care Teams include your primary Cardiologist (physician) and Advanced Practice Providers (APPs -  Physician Assistants and Nurse Practitioners) who all work together to provide you with the care you need, when you need it. . Your physician recommends that you schedule a follow-up appointment in: [place]  .   Any Other Special Instructions Will Be Listed Below (If Applicable).

## 2019-01-01 ENCOUNTER — Encounter: Payer: Self-pay | Admitting: Cardiovascular Disease

## 2019-01-01 ENCOUNTER — Other Ambulatory Visit (INDEPENDENT_AMBULATORY_CARE_PROVIDER_SITE_OTHER): Payer: Medicare Other

## 2019-01-01 DIAGNOSIS — I480 Paroxysmal atrial fibrillation: Secondary | ICD-10-CM | POA: Diagnosis not present

## 2019-01-01 DIAGNOSIS — Z79899 Other long term (current) drug therapy: Secondary | ICD-10-CM

## 2019-01-01 DIAGNOSIS — Z7901 Long term (current) use of anticoagulants: Secondary | ICD-10-CM | POA: Diagnosis not present

## 2019-01-01 DIAGNOSIS — I10 Essential (primary) hypertension: Secondary | ICD-10-CM

## 2019-01-01 DIAGNOSIS — E781 Pure hyperglyceridemia: Secondary | ICD-10-CM | POA: Diagnosis not present

## 2019-01-03 ENCOUNTER — Other Ambulatory Visit: Payer: Self-pay | Admitting: Cardiovascular Disease

## 2019-01-13 ENCOUNTER — Other Ambulatory Visit: Payer: Self-pay | Admitting: Internal Medicine

## 2019-01-13 DIAGNOSIS — I10 Essential (primary) hypertension: Secondary | ICD-10-CM | POA: Diagnosis not present

## 2019-01-13 DIAGNOSIS — Z79899 Other long term (current) drug therapy: Secondary | ICD-10-CM | POA: Diagnosis not present

## 2019-01-13 LAB — BASIC METABOLIC PANEL
BUN/Creatinine Ratio: 18 (ref 10–24)
BUN: 27 mg/dL (ref 8–27)
CO2: 24 mmol/L (ref 20–29)
Calcium: 9.9 mg/dL (ref 8.6–10.2)
Chloride: 105 mmol/L (ref 96–106)
Creatinine, Ser: 1.48 mg/dL — ABNORMAL HIGH (ref 0.76–1.27)
GFR calc Af Amer: 51 mL/min/{1.73_m2} — ABNORMAL LOW (ref >59)
GFR calc non Af Amer: 44 mL/min/{1.73_m2} — ABNORMAL LOW (ref >59)
Glucose: 82 mg/dL (ref 65–99)
Potassium: 5.6 mmol/L — ABNORMAL HIGH (ref 3.5–5.2)
Sodium: 141 mmol/L (ref 134–144)

## 2019-01-22 DIAGNOSIS — G4733 Obstructive sleep apnea (adult) (pediatric): Secondary | ICD-10-CM | POA: Diagnosis not present

## 2019-01-22 DIAGNOSIS — C4441 Basal cell carcinoma of skin of scalp and neck: Secondary | ICD-10-CM | POA: Diagnosis not present

## 2019-01-22 DIAGNOSIS — L570 Actinic keratosis: Secondary | ICD-10-CM | POA: Diagnosis not present

## 2019-02-04 DIAGNOSIS — C4441 Basal cell carcinoma of skin of scalp and neck: Secondary | ICD-10-CM | POA: Diagnosis not present

## 2019-02-05 ENCOUNTER — Ambulatory Visit: Payer: Medicare Other | Admitting: Nurse Practitioner

## 2019-03-06 ENCOUNTER — Other Ambulatory Visit: Payer: Self-pay | Admitting: Cardiovascular Disease

## 2019-03-06 NOTE — Telephone Encounter (Signed)
Refill request for Eliquis

## 2019-03-18 ENCOUNTER — Other Ambulatory Visit: Payer: Self-pay | Admitting: Cardiovascular Disease

## 2019-04-22 DIAGNOSIS — G4733 Obstructive sleep apnea (adult) (pediatric): Secondary | ICD-10-CM | POA: Diagnosis not present

## 2019-05-05 ENCOUNTER — Other Ambulatory Visit: Payer: Self-pay | Admitting: Nurse Practitioner

## 2019-06-22 ENCOUNTER — Other Ambulatory Visit: Payer: Self-pay | Admitting: Cardiovascular Disease

## 2019-07-08 ENCOUNTER — Other Ambulatory Visit: Payer: Self-pay | Admitting: Cardiovascular Disease

## 2019-07-21 ENCOUNTER — Other Ambulatory Visit: Payer: Self-pay | Admitting: Nurse Practitioner

## 2019-07-21 DIAGNOSIS — G4733 Obstructive sleep apnea (adult) (pediatric): Secondary | ICD-10-CM | POA: Diagnosis not present

## 2019-07-25 ENCOUNTER — Other Ambulatory Visit: Payer: Self-pay | Admitting: Nurse Practitioner

## 2019-08-06 ENCOUNTER — Encounter: Payer: Self-pay | Admitting: Nurse Practitioner

## 2019-08-06 ENCOUNTER — Ambulatory Visit (INDEPENDENT_AMBULATORY_CARE_PROVIDER_SITE_OTHER): Payer: Medicare Other

## 2019-08-06 ENCOUNTER — Ambulatory Visit (INDEPENDENT_AMBULATORY_CARE_PROVIDER_SITE_OTHER): Payer: Medicare Other | Admitting: Nurse Practitioner

## 2019-08-06 ENCOUNTER — Other Ambulatory Visit: Payer: Self-pay

## 2019-08-06 VITALS — BP 116/62 | HR 53 | Temp 97.7°F | Ht 70.0 in | Wt 193.4 lb

## 2019-08-06 VITALS — BP 116/62 | HR 53 | Temp 97.7°F | Ht 70.0 in | Wt 193.3 lb

## 2019-08-06 DIAGNOSIS — R972 Elevated prostate specific antigen [PSA]: Secondary | ICD-10-CM

## 2019-08-06 DIAGNOSIS — I10 Essential (primary) hypertension: Secondary | ICD-10-CM | POA: Diagnosis not present

## 2019-08-06 DIAGNOSIS — I480 Paroxysmal atrial fibrillation: Secondary | ICD-10-CM | POA: Insufficient documentation

## 2019-08-06 DIAGNOSIS — Z0000 Encounter for general adult medical examination without abnormal findings: Secondary | ICD-10-CM | POA: Diagnosis not present

## 2019-08-06 DIAGNOSIS — E782 Mixed hyperlipidemia: Secondary | ICD-10-CM

## 2019-08-06 DIAGNOSIS — Z79899 Other long term (current) drug therapy: Secondary | ICD-10-CM | POA: Diagnosis not present

## 2019-08-06 LAB — POCT URINALYSIS DIPSTICK
Bilirubin, UA: NEGATIVE
Blood, UA: NEGATIVE
Glucose, UA: NEGATIVE
Ketones, UA: NEGATIVE
Leukocytes, UA: NEGATIVE
Nitrite, UA: NEGATIVE
Protein, UA: NEGATIVE
Spec Grav, UA: 1.025 (ref 1.010–1.025)
Urobilinogen, UA: 0.2 E.U./dL
pH, UA: 5.5 (ref 5.0–8.0)

## 2019-08-06 LAB — POCT UA - MICROALBUMIN
Albumin/Creatinine Ratio, Urine, POC: <30
Creatinine, POC: 300 mg/dL
Microalbumin Ur, POC: 10 mg/L

## 2019-08-06 MED ORDER — LISINOPRIL 40 MG PO TABS: 40.0000 mg | ORAL_TABLET | Freq: Every day | ORAL | 1 refills | Status: DC

## 2019-08-06 NOTE — Patient Instructions (Signed)
Mario Palmer , Thank you for taking time to come for your Medicare Wellness Visit. I appreciate your ongoing commitment to your health goals. Please review the following plan we discussed and let me know if I can assist you in the future.   Screening recommendations/referrals: Colonoscopy: not required Recommended yearly ophthalmology/optometry visit for glaucoma screening and checkup Recommended yearly dental visit for hygiene and checkup  Vaccinations: Influenza vaccine: declines Pneumococcal vaccine: declines Tdap vaccine: 06/2011 Shingles vaccine: discussed    Advanced directives: Please bring a copy of your POA (Power of Attorney) and/or Living Will to your next appointment.    Conditions/risks identified: overweight  Next appointment: 08/11/2020 at 9:30  Preventive Care 81 Years and Older, Male Preventive care refers to lifestyle choices and visits with your health care provider that can promote health and wellness. What does preventive care include?  A yearly physical exam. This is also called an annual well check.  Dental exams once or twice a year.  Routine eye exams. Ask your health care provider how often you should have your eyes checked.  Personal lifestyle choices, including:  Daily care of your teeth and gums.  Regular physical activity.  Eating a healthy diet.  Avoiding tobacco and drug use.  Limiting alcohol use.  Practicing safe sex.  Taking low doses of aspirin every day.  Taking vitamin and mineral supplements as recommended by your health care provider. What happens during an annual well check? The services and screenings done by your health care provider during your annual well check will depend on your age, overall health, lifestyle risk factors, and family history of disease. Counseling  Your health care provider may ask you questions about your:  Alcohol use.  Tobacco use.  Drug use.  Emotional well-being.  Home and relationship  well-being.  Sexual activity.  Eating habits.  History of falls.  Memory and ability to understand (cognition).  Work and work Statistician. Screening  You may have the following tests or measurements:  Height, weight, and BMI.  Blood pressure.  Lipid and cholesterol levels. These may be checked every 5 years, or more frequently if you are over 22 years old.  Skin check.  Lung cancer screening. You may have this screening every year starting at age 7 if you have a 30-pack-year history of smoking and currently smoke or have quit within the past 15 years.  Fecal occult blood test (FOBT) of the stool. You may have this test every year starting at age 68.  Flexible sigmoidoscopy or colonoscopy. You may have a sigmoidoscopy every 5 years or a colonoscopy every 10 years starting at age 63.  Prostate cancer screening. Recommendations will vary depending on your family history and other risks.  Hepatitis C blood test.  Hepatitis B blood test.  Sexually transmitted disease (STD) testing.  Diabetes screening. This is done by checking your blood sugar (glucose) after you have not eaten for a while (fasting). You may have this done every 1-3 years.  Abdominal aortic aneurysm (AAA) screening. You may need this if you are a current or former smoker.  Osteoporosis. You may be screened starting at age 45 if you are at high risk. Talk with your health care provider about your test results, treatment options, and if necessary, the need for more tests. Vaccines  Your health care provider may recommend certain vaccines, such as:  Influenza vaccine. This is recommended every year.  Tetanus, diphtheria, and acellular pertussis (Tdap, Td) vaccine. You may need a Td booster  every 10 years.  Zoster vaccine. You may need this after age 18.  Pneumococcal 13-valent conjugate (PCV13) vaccine. One dose is recommended after age 67.  Pneumococcal polysaccharide (PPSV23) vaccine. One dose is  recommended after age 15. Talk to your health care provider about which screenings and vaccines you need and how often you need them. This information is not intended to replace advice given to you by your health care provider. Make sure you discuss any questions you have with your health care provider. Document Released: 04/16/2015 Document Revised: 12/08/2015 Document Reviewed: 01/19/2015 Elsevier Interactive Patient Education  2017 [place] Prevention in the Home Falls can cause injuries. They can happen to people of all ages. There are many things you can do to make your home safe and to help prevent falls. What can I do on the outside of my home?  Regularly fix the edges of walkways and driveways and fix any cracks.  Remove anything that might make you trip as you walk through a door, such as a raised step or threshold.  Trim any bushes or trees on the path to your home.  Use bright outdoor lighting.  Clear any walking paths of anything that might make someone trip, such as rocks or tools.  Regularly check to see if handrails are loose or broken. Make sure that both sides of any steps have handrails.  Any raised decks and porches should have guardrails on the edges.  Have any leaves, snow, or ice cleared regularly.  Use sand or salt on walking paths during winter.  Clean up any spills in your garage right away. This includes oil or grease spills. What can I do in the bathroom?  Use night lights.  Install grab bars by the toilet and in the tub and shower. Do not use towel bars as grab bars.  Use non-skid mats or decals in the tub or shower.  If you need to sit down in the shower, use a plastic, non-slip stool.  Keep the floor dry. Clean up any water that spills on the floor as soon as it happens.  Remove soap buildup in the tub or shower regularly.  Attach bath mats securely with double-sided non-slip rug tape.  Do not have throw rugs and other things on  the floor that can make you trip. What can I do in the bedroom?  Use night lights.  Make sure that you have a light by your bed that is easy to reach.  Do not use any sheets or blankets that are too big for your bed. They should not hang down onto the floor.  Have a firm chair that has side arms. You can use this for support while you get dressed.  Do not have throw rugs and other things on the floor that can make you trip. What can I do in the kitchen?  Clean up any spills right away.  Avoid walking on wet floors.  Keep items that you use a lot in easy-to-reach places.  If you need to reach something above you, use a strong step stool that has a grab bar.  Keep electrical cords out of the way.  Do not use floor polish or wax that makes floors slippery. If you must use wax, use non-skid floor wax.  Do not have throw rugs and other things on the floor that can make you trip. What can I do with my stairs?  Do not leave any items on the stairs.  Make  sure that there are handrails on both sides of the stairs and use them. Fix handrails that are broken or loose. Make sure that handrails are as long as the stairways.  Check any carpeting to make sure that it is firmly attached to the stairs. Fix any carpet that is loose or worn.  Avoid having throw rugs at the top or bottom of the stairs. If you do have throw rugs, attach them to the floor with carpet tape.  Make sure that you have a light switch at the top of the stairs and the bottom of the stairs. If you do not have them, ask someone to add them for you. What else can I do to help prevent falls?  Wear shoes that:  Do not have high heels.  Have rubber bottoms.  Are comfortable and fit you well.  Are closed at the toe. Do not wear sandals.  If you use a stepladder:  Make sure that it is fully opened. Do not climb a closed stepladder.  Make sure that both sides of the stepladder are locked into place.  Ask someone to  hold it for you, if possible.  Clearly mark and make sure that you can see:  Any grab bars or handrails.  First and last steps.  Where the edge of each step is.  Use tools that help you move around (mobility aids) if they are needed. These include:  Canes.  Walkers.  Scooters.  Crutches.  Turn on the lights when you go into a dark area. Replace any light bulbs as soon as they burn out.  Set up your furniture so you have a clear path. Avoid moving your furniture around.  If any of your floors are uneven, fix them.  If there are any pets around you, be aware of where they are.  Review your medicines with your doctor. Some medicines can make you feel dizzy. This can increase your chance of falling. Ask your doctor what other things that you can do to help prevent falls. This information is not intended to replace advice given to you by your health care provider. Make sure you discuss any questions you have with your health care provider. Document Released: 01/14/2009 Document Revised: 08/26/2015 Document Reviewed: 04/24/2014 Elsevier Interactive Patient Education  2017 [employer].

## 2019-08-06 NOTE — Progress Notes (Signed)
This visit occurred during the SARS-CoV-2 public health emergency.  Safety protocols were in place, including screening questions prior to the visit, additional usage of staff PPE, and extensive cleaning of exam room while observing appropriate contact time as indicated for disinfecting solutions.  Subjective:   Mario Palmer is a 79 y.o. male who presents for Medicare Annual/Subsequent preventive examination.  Review of Systems:  n/a Cardiac Risk Factors include: advanced age (>63men, >56 women);hypertension;male gender     Objective:    Vitals: BP 116/62 (BP Location: Left Arm, Patient Position: Sitting, Cuff Size: Normal)   Pulse (!) 53   Temp 97.7 F (36.5 C) (Oral)   Ht 5\' 10"  ([id number] m)   Wt 193 lb 6.4 oz (87.7 kg)   SpO2 96%   BMI 27.75 kg/m   Body mass index is 27.75 kg/m.  Advanced Directives 08/06/2019 12/07/2018 12/03/2018 11/28/2018 11/21/2018 08/01/2018 06/20/2018  Does Patient Have a Medical Advance Directive? Yes - Yes Yes Yes Yes No  Type of Advance Directive [place];Living will [place];Living will [place];Living will [place];Living will Healthcare Power of [place] -  Does patient want to make changes to medical advance directive? - - - - - No - Patient declined -  Copy of [place] in Chart? No - copy requested - No - copy requested, Physician notified No - copy requested No - copy requested No - copy requested -  Would patient like information on creating a medical advance directive? - - - - - - No - Patient declined    Tobacco Social History   Tobacco Use  Smoking Status Former Smoker  . Types: Cigarettes  Smokeless Tobacco Never Used  Tobacco Comment   quit smoking in 1982     Counseling given: Not Answered Comment: quit smoking in 1982   Clinical Intake:  Pre-visit preparation completed: Yes  Pain : No/denies pain      Nutritional Status: BMI 25 -29 Overweight Nutritional Risks: None Diabetes: No  How often do you need to have someone help you when you read instructions, pamphlets, or other written materials from your doctor or pharmacy?: 1 - Never What is the last grade level you completed in school?: 12th grade  Interpreter Needed?: No  Information entered by :: NAllen LPN  Past Medical History:  Diagnosis Date  . Arthritis   . Cancer ([place])    skin cancer  . Dysrhythmia    afib  . GERD (gastroesophageal reflux disease)   . High cholesterol   . History of kidney stones   . Hx of echocardiogram 12/2010   EF 55%, There is Stage 1 diastolic dyfunction. Normal LV filling pressure. Aortic sclerosis with mild AI, mild TR  . Hypertension   . Palpitation    with ecotopy  . Sleep apnea    Past Surgical History:  Procedure Laterality Date  . CARDIOVERSION N/A 09/12/2016   Procedure: CARDIOVERSION;  Surgeon: Troy Sine, MD;  Location: [facility] ENDOSCOPY;  Service: Cardiovascular;  Laterality: N/A;  . EYE SURGERY Bilateral    cataract  . INGUINAL HERNIA REPAIR Left 11/28/2018   Procedure: LAPAROSCOPIC  LEFT INGUINAL HERNIA WITH MESH;  Surgeon: Coralie Keens, MD;  Location: [facility];  Service: General;  Laterality: Left;  . MOHS SURGERY Left    skin cancer remover from left side  . RETINAL DETACHMENT SURGERY Right   . RETINAL LASER PROCEDURE Right 04/2018  .  SKIN CANCER EXCISION  07/2018  . TONSILLECTOMY    . UMBILICAL HERNIA REPAIR N/A 11/28/2018   Procedure: OPEN UMBILICAL HERNIA REPAIR WITH MESH;  Surgeon: Coralie Keens, MD;  Location: [facility];  Service: General;  Laterality: N/A;   History reviewed. No pertinent family history. Social History   Socioeconomic History  . Marital status: Married    Spouse name: Not on file  . Number of children: Not on file  . Years of education: Not on file  . Highest education level: Not on file  Occupational History  . Occupation: retired  Tobacco  Use  . Smoking status: Former Smoker    Types: Cigarettes  . Smokeless tobacco: Never Used  . Tobacco comment: quit smoking in 1982  Substance and Sexual Activity  . Alcohol use: No  . Drug use: No  . Sexual activity: Yes  Other Topics Concern  . Not on file  Social History Narrative  . Not on file   Social Determinants of Health   Financial Resource Strain: Low Risk   . Difficulty of Paying Living Expenses: Not hard at all  Food Insecurity: No Food Insecurity  . Worried About Charity fundraiser in the Last Year: Never true  . Ran Out of Food in the Last Year: Never true  Transportation Needs: No Transportation Needs  . Lack of Transportation (Medical): No  . Lack of Transportation (Non-Medical): No  Physical Activity: Sufficiently Active  . Days of Exercise per Week: 6 days  . Minutes of Exercise per Session: 60 min  Stress: No Stress Concern Present  . Feeling of Stress : Not at all  Social Connections:   . Frequency of Communication with Friends and Family:   . Frequency of Social Gatherings with Friends and Family:   . Attends Religious Services:   . Active Member of Clubs or Organizations:   . Attends Archivist Meetings:   Marland Kitchen Marital Status:     Outpatient Encounter Medications as of 08/06/2019  Medication Sig  . amiodarone (PACERONE) 200 MG tablet take two tablets by mouth daily  . apixaban (ELIQUIS) 5 MG TABS tablet Take 1 tablet (5 mg total) by mouth 2 (two) times daily.  Marland Kitchen atorvastatin (LIPITOR) 20 MG tablet TAKE ONE TABLET BY MOUTH ONE TIME DAILY   . BYSTOLIC 10 MG tablet TAKE ONE TABLET BY MOUTH ONE TIME DAILY   . Cholecalciferol (VITAMIN D) 2000 UNITS tablet Take 2,000 Units by mouth daily.  Marland Kitchen diltiazem (CARDIZEM CD) 120 MG 24 hr capsule TAKE ONE CAPSULE BY MOUTH DAILY AT BEDTIME   . lisinopril (ZESTRIL) 40 MG tablet TAKE ONE TABLET BY MOUTH ONE TIME DAILY   . Multiple Vitamins-Minerals (MULTIVITAMIN ADULT PO) Take 1 tablet by mouth daily.  . NON  FORMULARY CPAP therapy  . Saccharomyces boulardii (PROBIOTIC) 250 MG CAPS Take 1 capsule by mouth daily.  . sodium chloride (OCEAN) 0.65 % SOLN nasal spray Place 1 spray into both nostrils as needed for congestion.  . tamsulosin (FLOMAX) 0.4 MG CAPS capsule Take 0.4 mg by mouth daily.  Marland Kitchen spironolactone (ALDACTONE) 25 MG tablet TAKE ONE TABLET BY MOUTH ONE TIME DAILY  (Patient not taking: Reported on 08/06/2019)  . traMADol (ULTRAM) 50 MG tablet Take 1 tablet (50 mg total) by mouth every 6 (six) hours as needed for moderate pain or severe pain. (Patient not taking: Reported on 08/06/2019)   No facility-administered encounter medications on file as of 08/06/2019.    Activities of Daily Living  In your present state of health, do you have any difficulty performing the following activities: 08/06/2019 12/07/2018  Hearing? Y Y  Comment 2 hearing aides -  Vision? N N  Difficulty concentrating or making decisions? N N  Walking or climbing stairs? N N  Dressing or bathing? N N  Doing errands, shopping? N N  Preparing Food and eating ? N -  Using the Toilet? N -  In the past six months, have you accidently leaked urine? N -  Do you have problems with loss of bowel control? N -  Managing your Medications? N -  Managing your Finances? N -  Housekeeping or managing your Housekeeping? N -  Some recent data might be hidden    Patient Care Team: [name], FNP as PCP - General ([place]) Troy Sine, MD as PCP - Cardiology (Cardiology) Zadie Rhine Clent Demark, MD as Consulting Physician (Ophthalmology)   Assessment:   This is a routine wellness examination for Caisyn.  Exercise Activities and Dietary recommendations Current Exercise Habits: Home exercise routine, Type of exercise: walking(stationary bike), Time (Minutes): 60, Frequency (Times/Week): 6, Weekly Exercise (Minutes/Week): 360  Goals    . Patient Stated     Wants to be able to move like 20 years ago    . Patient Stated      08/06/2019, no goals       Fall Risk Fall Risk  08/06/2019 08/01/2018 05/20/2018 03/15/2018  Falls in the past year? 0 0 0 0  Risk for fall due to : Medication side effect Medication side effect - -  Follow up Falls evaluation completed;Education provided;Falls prevention discussed Falls prevention discussed - -   Is the patient's home free of loose throw rugs in walkways, pet beds, electrical cords, etc?   yes      Grab bars in the bathroom? no      Handrails on the stairs?   yes      Adequate lighting?   yes  Timed Get Up and Go Performed: n/a  Depression Screen PHQ 2/9 Scores 08/06/2019 08/01/2018 05/20/2018 03/15/2018  PHQ - 2 Score 0 0 0 0  PHQ- 9 Score 0 0 - -    Cognitive Function     6CIT Screen 08/06/2019 08/01/2018  What Year? 0 points 0 points  What month? 0 points 0 points  What time? 0 points 0 points  Count back from 20 0 points 0 points  Months in reverse 0 points 0 points  Repeat phrase 0 points 0 points  Total Score 0 0    Immunization History  Administered Date(s) Administered  . Tdap 06/03/2011    Qualifies for Shingles Vaccine? yes  Screening Tests Health Maintenance  Topic Date Due  . COVID-19 Vaccine (1) Never done  . PNA vac Low Risk Adult (1 of 2 - PCV13) Never done  . INFLUENZA VACCINE  11/02/2019  . TETANUS/TDAP  06/02/2021   Cancer Screenings: Lung: Low Dose CT Chest recommended if Age 18-80 years, 30 pack-year currently smoking OR have quit w/in 15years. Patient does not qualify. Colorectal: not required  Additional Screenings:  Hepatitis C Screening:n/a      Plan:    Patient has no goals set at this time.  I have personally reviewed and noted the following in the patient's chart:   . Medical and social history . Use of alcohol, tobacco or illicit drugs  . Current medications and supplements . Functional ability and status . Nutritional status . Physical activity .  Advanced directives . List of other  physicians . Hospitalizations, surgeries, and ER visits in previous 12 months . Vitals . Screenings to include cognitive, depression, and falls . Referrals and appointments  In addition, I have reviewed and discussed with patient certain preventive protocols, quality metrics, and best practice recommendations. A written personalized care plan for preventive services as well as general preventive health recommendations were provided to patient.     Kellie Simmering, LPN  [id number]

## 2019-08-06 NOTE — Progress Notes (Signed)
This visit occurred during the SARS-CoV-2 public health emergency.  Safety protocols were in place, including screening questions prior to the visit, additional usage of staff PPE, and extensive cleaning of exam room while observing appropriate contact time as indicated for disinfecting solutions.  Subjective:     Patient ID: Mario Palmer , male    DOB: 1941-01-19 , 79 y.o.   MRN: [id number]   Chief Complaint  Patient presents with  . Annual Exam    HPI  Here for HM He had retina surgery, double hernia surgery and 2 areas of skin cancer last year.    Wt Readings from Last 3 Encounters: 08/06/19 : 193 lb 5.5 oz (87.7 kg) 08/06/19 : 193 lb 6.4 oz (87.7 kg) 12/30/18 : 189 lb (85.7 kg)  He has had his Covid Vaccine - did well    Men's preventive visit. Patient Health Questionnaire (PHQ-2) is    Clinical Support from 08/06/2019 in [facility]  PHQ-2 Total Score  0     Patient is on a Regular diet. He is bicycling and walking 6 days a week.  Marital status: Married. Relevant history for alcohol use is:  Social History   Substance and Sexual Activity  Alcohol Use No   Relevant history for tobacco use is:  Social History   Tobacco Use  Smoking Status Former Smoker  . Types: Cigarettes  Smokeless Tobacco Never Used  Tobacco Comment   quit smoking in 1982    Past Medical History:  Diagnosis Date  . Arthritis   . Cancer ([place])    skin cancer  . Dysrhythmia    afib  . GERD (gastroesophageal reflux disease)   . High cholesterol   . History of kidney stones   . Hx of echocardiogram 12/2010   EF 55%, There is Stage 1 diastolic dyfunction. Normal LV filling pressure. Aortic sclerosis with mild AI, mild TR  . Hypertension   . Palpitation    with ecotopy  . Sleep apnea      No family history on file.   Current Outpatient Medications:  .  amiodarone (PACERONE) 200 MG tablet, take two tablets by mouth daily, Disp: 180 tablet, Rfl: 1 .   apixaban (ELIQUIS) 5 MG TABS tablet, Take 1 tablet (5 mg total) by mouth 2 (two) times daily., Disp: 180 tablet, Rfl: 1 .  atorvastatin (LIPITOR) 20 MG tablet, TAKE ONE TABLET BY MOUTH ONE TIME DAILY , Disp: 90 tablet, Rfl: 3 .  BYSTOLIC 10 MG tablet, TAKE ONE TABLET BY MOUTH ONE TIME DAILY , Disp: 90 tablet, Rfl: 0 .  Cholecalciferol (VITAMIN D) 2000 UNITS tablet, Take 2,000 Units by mouth daily., Disp: , Rfl:  .  diltiazem (CARDIZEM CD) 120 MG 24 hr capsule, TAKE ONE CAPSULE BY MOUTH DAILY AT BEDTIME , Disp: 90 capsule, Rfl: 3 .  lisinopril (ZESTRIL) 40 MG tablet, TAKE ONE TABLET BY MOUTH ONE TIME DAILY , Disp: 10 tablet, Rfl: 0 .  Multiple Vitamins-Minerals (MULTIVITAMIN ADULT PO), Take 1 tablet by mouth daily., Disp: , Rfl:  .  NON FORMULARY, CPAP therapy, Disp: , Rfl:  .  Saccharomyces boulardii (PROBIOTIC) 250 MG CAPS, Take 1 capsule by mouth daily., Disp: , Rfl:  .  sodium chloride (OCEAN) 0.65 % SOLN nasal spray, Place 1 spray into both nostrils as needed for congestion., Disp: , Rfl:  .  spironolactone (ALDACTONE) 25 MG tablet, TAKE ONE TABLET BY MOUTH ONE TIME DAILY  (Patient not taking: Reported on 08/06/2019), Disp:  90 tablet, Rfl: 2 .  tamsulosin (FLOMAX) 0.4 MG CAPS capsule, Take 0.4 mg by mouth daily., Disp: , Rfl:  .  traMADol (ULTRAM) 50 MG tablet, Take 1 tablet (50 mg total) by mouth every 6 (six) hours as needed for moderate pain or severe pain. (Patient not taking: Reported on 08/06/2019), Disp: 30 tablet, Rfl: 0   No Known Allergies   Review of Systems  Constitutional: Negative.   HENT: Negative.   Eyes: Negative.   Respiratory: Negative.   Cardiovascular: Negative.  Negative for chest pain, palpitations and leg swelling.  Gastrointestinal: Negative.   Endocrine: Negative.   Genitourinary: Negative.   Musculoskeletal: Negative.   Skin: Negative.   Neurological: Negative.   Hematological: Negative.   Psychiatric/Behavioral: Negative.      Today's Vitals   08/06/19  0926  BP: 116/62  Pulse: (!) 53  Temp: 97.7 F (36.5 C)  TempSrc: Oral  SpO2: 96%  Weight: 193 lb 5.5 oz (87.7 kg)  Height: 5' 10" (1.778 m)   Body mass index is 27.74 kg/m.   Objective:  Physical Exam Vitals reviewed.  Constitutional:      Appearance: Normal appearance. He is obese.  HENT:     Head: Normocephalic and atraumatic.     Right Ear: Tympanic membrane, ear canal and external ear normal. There is no impacted cerumen.     Left Ear: Tympanic membrane, ear canal and external ear normal. There is no impacted cerumen.  Cardiovascular:     Rate and Rhythm: Normal rate and regular rhythm.     Pulses: Normal pulses.     Heart sounds: Normal heart sounds. No murmur.  Pulmonary:     Effort: Pulmonary effort is normal. No respiratory distress.     Breath sounds: Normal breath sounds.  Abdominal:     General: Abdomen is flat. Bowel sounds are normal. There is no distension.     Palpations: Abdomen is soft.  Musculoskeletal:        General: Normal range of motion.     Cervical back: Normal range of motion and neck supple.  Skin:    General: Skin is warm.     Capillary Refill: Capillary refill takes less than 2 seconds.  Neurological:     General: No focal deficit present.     Mental Status: He is alert and oriented to person, place, and time.  Psychiatric:        Mood and Affect: Mood normal.        Behavior: Behavior normal.        Thought Content: Thought content normal.        Judgment: Judgment normal.         Assessment And Plan:     1. Health maintenance examination . Behavior modifications discussed and diet history reviewed.   . Pt will continue to exercise regularly and modify diet with low GI, plant based foods and decrease intake of processed foods.  . Recommend intake of daily multivitamin, Vitamin D, and calcium.  Marland Kitchen He had his AWV today as well  2. Paroxysmal atrial fibrillation (HCC)  Chronic, stable  Continue follow up with cardiology  3.  Essential hypertension  Chronic, good control  Continue with current medications - lisinopril (ZESTRIL) 40 MG tablet; Take 1 tablet (40 mg total) by mouth daily.  Dispense: 90 tablet; Refill: 1 - CMP14+EGFR  4. Mixed hyperlipidemia  Chronic, controlled  Continue with current medications - Lipid panel - CMP14+EGFR  5. Elevated PSA  Chronic,  he is being followed by Urology - PSA  6. Other long term (current) drug therapy  - TSH   [name], FNP    THE PATIENT IS ENCOURAGED TO PRACTICE SOCIAL DISTANCING DUE TO THE COVID-19 PANDEMIC.

## 2019-08-07 LAB — CMP14+EGFR
ALT: 19 IU/L (ref 0–44)
AST: 19 IU/L (ref 0–40)
Albumin/Globulin Ratio: 1.6 (ref 1.2–2.2)
Albumin: 4.4 g/dL (ref 3.7–4.7)
Alkaline Phosphatase: 74 IU/L (ref 39–117)
BUN/Creatinine Ratio: 15 (ref 10–24)
BUN: 23 mg/dL (ref 8–27)
Bilirubin Total: 0.8 mg/dL (ref 0.0–1.2)
CO2: 22 mmol/L (ref 20–29)
Calcium: 9.9 mg/dL (ref 8.6–10.2)
Chloride: 104 mmol/L (ref 96–106)
Creatinine, Ser: 1.58 mg/dL — ABNORMAL HIGH (ref 0.76–1.27)
GFR calc Af Amer: 47 mL/min/{1.73_m2} — ABNORMAL LOW (ref >59)
GFR calc non Af Amer: 41 mL/min/{1.73_m2} — ABNORMAL LOW (ref >59)
Globulin, Total: 2.7 g/dL (ref 1.5–4.5)
Glucose: 77 mg/dL (ref 65–99)
Potassium: 4.6 mmol/L (ref 3.5–5.2)
Sodium: 141 mmol/L (ref 134–144)
Total Protein: 7.1 g/dL (ref 6.0–8.5)

## 2019-08-07 LAB — TSH: TSH: 1.7 u[IU]/mL (ref 0.450–4.500)

## 2019-08-07 LAB — LIPID PANEL
Chol/HDL Ratio: 2.6 ratio (ref 0.0–5.0)
Cholesterol, Total: 170 mg/dL (ref 100–199)
HDL: 66 mg/dL (ref >39)
LDL Chol Calc (NIH): 85 mg/dL (ref 0–99)
Triglycerides: 104 mg/dL (ref 0–149)
VLDL Cholesterol Cal: 19 mg/dL (ref 5–40)

## 2019-08-07 LAB — PSA: Prostate Specific Ag, Serum: 8.6 ng/mL — ABNORMAL HIGH (ref 0.0–4.0)

## 2019-09-07 ENCOUNTER — Other Ambulatory Visit: Payer: Self-pay | Admitting: Cardiovascular Disease

## 2019-09-08 ENCOUNTER — Other Ambulatory Visit: Payer: Self-pay | Admitting: Pharmacist Clinician (PhC)/ Clinical Pharmacy Specialist

## 2019-09-08 MED ORDER — APIXABAN 2.5 MG PO TABS: 2.5000 mg | ORAL_TABLET | Freq: Two times a day (BID) | ORAL | 1 refills | Status: DC

## 2019-09-08 NOTE — Telephone Encounter (Signed)
31 M 87.7kg, SCr 1.58 (5/21), Claiborne Billings 9/20  Will switch patient to 2.5 mg dose as SCr often > 1.5 and patient now 79 years old.  Spoke with wife to confirm dose change.  She voiced understanding.

## 2019-09-09 ENCOUNTER — Other Ambulatory Visit: Payer: Self-pay

## 2019-09-09 ENCOUNTER — Ambulatory Visit (INDEPENDENT_AMBULATORY_CARE_PROVIDER_SITE_OTHER): Payer: Medicare Other | Admitting: Ophthalmology

## 2019-09-09 ENCOUNTER — Encounter (INDEPENDENT_AMBULATORY_CARE_PROVIDER_SITE_OTHER): Payer: Self-pay | Admitting: Ophthalmology

## 2019-09-09 DIAGNOSIS — H35371 Puckering of macula, right eye: Secondary | ICD-10-CM

## 2019-09-09 DIAGNOSIS — H353112 Nonexudative age-related macular degeneration, right eye, intermediate dry stage: Secondary | ICD-10-CM | POA: Insufficient documentation

## 2019-09-09 DIAGNOSIS — Z9889 Other specified postprocedural states: Secondary | ICD-10-CM | POA: Diagnosis not present

## 2019-09-09 NOTE — Assessment & Plan Note (Signed)
This condition is resolved

## 2019-09-09 NOTE — Progress Notes (Signed)
09/09/2019     CHIEF COMPLAINT Patient presents for Retina Follow Up   HISTORY OF PRESENT ILLNESS: Mario Palmer is a 79 y.o. male who presents to the clinic today for:   HPI    Retina Follow Up    Patient presents with  Other.  In both eyes.  Duration of 1 year.  Since onset it is stable.          Comments    1 year follow up - OCT OU Patient denies change in vision and overall has no complaints.        Last edited by Gerda Diss on 09/09/2019  9:30 AM. (History)      Referring physician: [name], [place] [address] [place],  [place] [address]  HISTORICAL INFORMATION:   Selected notes from the MEDICAL RECORD NUMBER       CURRENT MEDICATIONS: No current outpatient medications on file. (Ophthalmic Drugs)   No current facility-administered medications for this visit. (Ophthalmic Drugs)   Current Outpatient Medications (Other)  Medication Sig  . amiodarone (PACERONE) 200 MG tablet take two tablets by mouth daily  . apixaban (ELIQUIS) 2.5 MG TABS tablet Take 1 tablet (2.5 mg total) by mouth 2 (two) times daily.  Marland Kitchen atorvastatin (LIPITOR) 20 MG tablet TAKE ONE TABLET BY MOUTH ONE TIME DAILY   . BYSTOLIC 10 MG tablet TAKE ONE TABLET BY MOUTH ONE TIME DAILY   . Cholecalciferol (VITAMIN D) 2000 UNITS tablet Take 2,000 Units by mouth daily.  Marland Kitchen diltiazem (CARDIZEM CD) 120 MG 24 hr capsule TAKE ONE CAPSULE BY MOUTH DAILY AT BEDTIME   . lisinopril (ZESTRIL) 40 MG tablet Take 1 tablet (40 mg total) by mouth daily.  . Multiple Vitamins-Minerals (MULTIVITAMIN ADULT PO) Take 1 tablet by mouth daily.  . NON FORMULARY CPAP therapy  . Saccharomyces boulardii (PROBIOTIC) 250 MG CAPS Take 1 capsule by mouth daily.  . sodium chloride (OCEAN) 0.65 % SOLN nasal spray Place 1 spray into both nostrils as needed for congestion.  Marland Kitchen spironolactone (ALDACTONE) 25 MG tablet TAKE ONE TABLET BY MOUTH ONE TIME DAILY  (Patient not taking: Reported on 08/06/2019)  . tamsulosin  (FLOMAX) 0.4 MG CAPS capsule Take 0.4 mg by mouth daily.  . traMADol (ULTRAM) 50 MG tablet Take 1 tablet (50 mg total) by mouth every 6 (six) hours as needed for moderate pain or severe pain. (Patient not taking: Reported on 08/06/2019)   No current facility-administered medications for this visit. (Other)      REVIEW OF SYSTEMS:    ALLERGIES No Known Allergies  PAST MEDICAL HISTORY Past Medical History:  Diagnosis Date  . Arthritis   . Cancer ([place])    skin cancer  . Dysrhythmia    afib  . GERD (gastroesophageal reflux disease)   . High cholesterol   . History of kidney stones   . Hx of echocardiogram 12/2010   EF 55%, There is Stage 1 diastolic dyfunction. Normal LV filling pressure. Aortic sclerosis with mild AI, mild TR  . Hypertension   . Palpitation    with ecotopy  . Sleep apnea    Past Surgical History:  Procedure Laterality Date  . CARDIOVERSION N/A 09/12/2016   Procedure: CARDIOVERSION;  Surgeon: Troy Sine, MD;  Location: [facility] ENDOSCOPY;  Service: Cardiovascular;  Laterality: N/A;  . EYE SURGERY Bilateral    cataract  . INGUINAL HERNIA REPAIR Left 11/28/2018   Procedure: LAPAROSCOPIC  LEFT INGUINAL HERNIA WITH MESH;  Surgeon: Ninfa Linden,  Nathaneil Canary, MD;  Location: [facility];  Service: General;  Laterality: Left;  . MOHS SURGERY Left    skin cancer remover from left side  . RETINAL DETACHMENT SURGERY Right   . RETINAL LASER PROCEDURE Right 04/2018  . SKIN CANCER EXCISION  07/2018  . TONSILLECTOMY    . UMBILICAL HERNIA REPAIR N/A 11/28/2018   Procedure: OPEN UMBILICAL HERNIA REPAIR WITH MESH;  Surgeon: Coralie Keens, MD;  Location: [facility];  Service: General;  Laterality: N/A;    FAMILY HISTORY History reviewed. No pertinent family history.  SOCIAL HISTORY Social History   Tobacco Use  . Smoking status: Former Smoker    Types: Cigarettes  . Smokeless tobacco: Never Used  . Tobacco comment: quit smoking in 1982  Substance Use Topics  . Alcohol use: No  .  Drug use: No         OPHTHALMIC EXAM: Base Eye Exam    Visual Acuity (Snellen - Linear)      Right Left   Dist Bee 20/30+2 20/20   Dist ph [place] NI        Tonometry (Tonopen, 9:34 AM)      Right Left   Pressure 17 17       Pupils      Pupils Dark Light Shape React APD   Right PERRL 3 2 Round Slow None   Left PERRL 3 2 Round Slow None       Visual Fields (Counting fingers)      Left Right    Full Full       Extraocular Movement      Right Left    Full Full       Neuro/Psych    Oriented x3: Yes   Mood/Affect: Normal       Dilation    Both eyes: 1.0% Mydriacyl, 2.5% Phenylephrine @ 9:34 AM        Slit Lamp and Fundus Exam    External Exam      Right Left   External Normal Normal       Slit Lamp Exam      Right Left   Lids/Lashes Normal Normal   Conjunctiva/Sclera White and quiet White and quiet   Cornea Clear Clear   Anterior Chamber Deep and quiet Deep and quiet   Iris Round and reactive Round and reactive   Lens Posterior chamber intraocular lens Posterior chamber intraocular lens   Vitreous Normal Normal          IMAGING AND PROCEDURES  Imaging and Procedures for 09/09/19  OCT, Retina - OU - Both Eyes       Right Eye Quality was good. Scan locations included subfoveal.   Left Eye Quality was good. Scan locations included subfoveal.                 ASSESSMENT/PLAN:  No problem-specific Assessment & Plan notes found for this encounter.    No diagnosis found.  1.  2.  3.  Ophthalmic Meds Ordered this visit:  No orders of the defined types were placed in this encounter.      No follow-ups on file.  There are no Patient Instructions on file for this visit.   Explained the diagnoses, plan, and follow up with the patient and they expressed understanding.  Patient expressed understanding of the importance of proper follow up care.   Clent Demark Haidyn Kilburg M.D. Diseases & Surgery of the Retina and Vitreous Retina & Diabetic  [place] 09/09/19  Abbreviations: M myopia (nearsighted); A astigmatism; H hyperopia (farsighted); P presbyopia; Mrx spectacle prescription;  CTL contact lenses; OD right eye; OS left eye; OU both eyes  XT exotropia; ET esotropia; PEK punctate epithelial keratitis; PEE punctate epithelial erosions; DES dry eye syndrome; MGD meibomian gland dysfunction; ATs artificial tears; PFAT's preservative free artificial tears; [place] nuclear sclerotic cataract; PSC posterior subcapsular cataract; ERM epi-retinal membrane; PVD posterior vitreous detachment; RD retinal detachment; DM diabetes mellitus; DR diabetic retinopathy; NPDR non-proliferative diabetic retinopathy; PDR proliferative diabetic retinopathy; CSME clinically significant macular edema; DME diabetic macular edema; dbh dot blot hemorrhages; CWS cotton wool spot; POAG primary open angle glaucoma; C/D cup-to-disc ratio; HVF humphrey visual field; GVF goldmann visual field; OCT optical coherence tomography; IOP intraocular pressure; BRVO Branch retinal vein occlusion; CRVO central retinal vein occlusion; CRAO central retinal artery occlusion; BRAO branch retinal artery occlusion; RT retinal tear; SB scleral buckle; PPV pars plana vitrectomy; VH Vitreous hemorrhage; PRP panretinal laser photocoagulation; IVK intravitreal kenalog; VMT vitreomacular traction; MH Macular hole;  NVD neovascularization of the disc; NVE neovascularization elsewhere; AREDS age related eye disease study; ARMD age related macular degeneration; POAG primary open angle glaucoma; EBMD epithelial/anterior basement membrane dystrophy; ACIOL anterior chamber intraocular lens; IOL intraocular lens; PCIOL posterior chamber intraocular lens; Phaco/IOL phacoemulsification with intraocular lens placement; [place] photorefractive keratectomy; LASIK laser assisted in situ keratomileusis; HTN hypertension; DM diabetes mellitus; COPD chronic obstructive pulmonary disease

## 2019-09-22 ENCOUNTER — Telehealth: Payer: Self-pay | Admitting: Nurse Practitioner

## 2019-09-22 NOTE — Chronic Care Management (AMB) (Signed)
  Chronic Care Management   Outreach Note  09/22/2019 Name: [name] MRN: [id number] DOB: 1940/04/11  [name] is a 79 y.o. year old male who is a primary care patient of [name], [place]. I reached out to [employer] by phone today in response to a referral sent by Mr. Cai Anfinson Hardman's health plan.     An unsuccessful telephone outreach was attempted today. The patient was referred to the case management team for assistance with care management and care coordination.   Follow Up Plan: A HIPPA compliant phone message was left for the patient providing contact information and requesting a return call. The care management team will reach out to the patient again over the next 7 days. If patient returns call to provider office, please advise to call [place] at [phone].  [place], [place] [address] Direct Dial: [phone] [email or web address]: [email or web address]

## 2019-10-01 NOTE — Chronic Care Management (AMB) (Signed)
  Chronic Care Management   Outreach Note  10/01/2019 Name: Mario Palmer MRN: [id number] DOB: [birth year]  [name] is a 79 y.o. year old male who is a primary care patient of [name], [place]. I reached out to [employer] by phone today in response to a referral sent by [name]'s health plan.     A second unsuccessful telephone outreach was attempted today. The patient was referred to the case management team for assistance with care management and care coordination.   Follow Up Plan: A HIPPA compliant phone message was left for the patient providing contact information and requesting a return call. The care management team will reach out to the patient again over the next 7 days. If patient returns call to provider office, please advise to call [place] at [phone].  [place], [place] [address] Direct Dial: [phone] [email or web address] Website: [email or web address]

## 2019-10-07 NOTE — Chronic Care Management (AMB) (Signed)
  Chronic Care Management   Note  10/07/2019 Name: Mario Palmer MRN: [id number] DOB: [birth year]  [name] is a 79 y.o. year old male who is a primary care patient of [name], [place]. I reached out to [employer] by phone today in response to a referral sent by Mr. Kross Swallows Gotcher's health plan.     Mr. Levingston was given information about Chronic Care Management services today including:  1. CCM service includes personalized support from designated clinical staff supervised by his physician, including individualized plan of care and coordination with other care providers 2. 24/7 contact phone numbers for assistance for urgent and routine care needs. 3. Service will only be billed when office clinical staff spend 20 minutes or more in a month to coordinate care. 4. Only one practitioner may furnish and bill the service in a calendar month. 5. The patient may stop CCM services at any time (effective at the end of the month) by phone call to the office staff. 6. The patient will be responsible for cost sharing (co-pay) of up to 20% of the service fee (after annual deductible is met).  Patient did not agree to enrollment in care management services and does not wish to consider at this time.  Follow up plan: The care management team is available to follow up with the patient after provider conversation with the patient regarding recommendation for care management engagement and subsequent re-referral to the care management team.    [place], [place], [place] [address] Direct Dial: [email or web address] Website: [email or web address]

## 2019-10-13 ENCOUNTER — Ambulatory Visit: Payer: Medicare Other | Admitting: Cardiovascular Disease

## 2019-10-13 ENCOUNTER — Other Ambulatory Visit: Payer: Self-pay

## 2019-10-13 VITALS — BP 120/74 | HR 51 | Temp 97.0°F | Ht 70.0 in | Wt 194.4 lb

## 2019-10-13 DIAGNOSIS — I480 Paroxysmal atrial fibrillation: Secondary | ICD-10-CM | POA: Diagnosis not present

## 2019-10-13 DIAGNOSIS — E782 Mixed hyperlipidemia: Secondary | ICD-10-CM | POA: Diagnosis not present

## 2019-10-13 DIAGNOSIS — Z7901 Long term (current) use of anticoagulants: Secondary | ICD-10-CM | POA: Diagnosis not present

## 2019-10-13 DIAGNOSIS — G4733 Obstructive sleep apnea (adult) (pediatric): Secondary | ICD-10-CM

## 2019-10-13 DIAGNOSIS — Z9989 Dependence on other enabling machines and devices: Secondary | ICD-10-CM

## 2019-10-13 DIAGNOSIS — I10 Essential (primary) hypertension: Secondary | ICD-10-CM | POA: Diagnosis not present

## 2019-10-13 MED ORDER — AMIODARONE HCL 200 MG PO TABS: ORAL_TABLET | ORAL | 3 refills | Status: DC

## 2019-10-13 NOTE — Progress Notes (Signed)
Patient ID: Mario Palmer, male   DOB: 04-19-1940, 79 y.o.   MRN: [id number]    Primary M.D.: Dr. Bryon Lions  HPI: Mario Palmer is a 79 y.o. male who presents for a 10 month follow-up evaluation.  [name] has a history of hypertension, obstructive sleep apnea on CPAP therapy, GERD, and palpitations with atrial ectopy,. 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 He has history of hyperlipidemia and has been on simvastatin and had intermittently stopped this secondary to arthralgias.  An echo Doppler study in January 2015 revealed a normal systolic function with ejection fraction of 55-60%. There was evidence for mild aortic and mitral insufficiency. His left atrium was moderately dilated.  [name] was diagnosed with obstructive sleep apnea in 2010. He had mild sleep apnea with events worse in the supine position and during REM sleep. He has been on CPAP therapy since that time and initially he was started on an 8 cm water pressure but also this was later titrated to 10 cm.  He admits to 100% compliance.  His initial machine malfunctioned in 2017 and  He received a new AirSense 10 CPAP unit.  He has noticed significant improvement in this new machine.  Compared to his old one.  He admits to 100% compliance.  He goes to bed between 9:30 9:45 AM and wakes up once around 2:30 to the bathroom and typically sleeps 9-10 hours per night.  A download was obtained from his new machine from 05/07/2015 through 06/05/2015.  This revealed 100% compliance.  He is averaging 9 hours and 53 minutes per night.  He is set at a 10 cm water pressure.  His AHI was excellent at 1.1.  He uses a fullface mask.  There was no leak.    When I saw him in March 2018 his ECG demonstrated atrial fibrillation.  He was in this of questionable duration.  At that time, I initiated anticoagulation witheliquis  5 mg twice a day.  I discontinued amlodipine and started Cardizem CD 180 mg.  He underwent an echo  Doppler study on 06/23/2016 which showed an EF of 55-60%.  He did not have wall motion abnormalities.  There was mild aortic sclerosis with mildAR and mild focal calcification of the anterior mitral valve leaflet with trivial MR.  His right ventricle is mildly dilated.  His left atrium was severely dilated and measured 64 mm parasternally.  I initiated therapy with amiodarone as an antiarrhythmic agent, with the greatest likelihood for pharmacologic success.  I decrease Bystolic to 5 mg.  He started amiodarone at 200 mg first week and ultimately titrated this to 200 mg twice a day.  He has felt improved with control his ventricular rate.  He notices that his blood pressure is slightly increased on the reduced dose of Bystolic.  He admitted to 100% compliance with CPAP therapy.  He underwent successful DC cardioversion by me on 08/18/2016 with recurrence of sinus rhythm.  He has noticed improved energy since restoration of sinus rhythm.  He is unaware of any significant ectopy but does note a rare palpitation.  He has been exercising and pedals on a stationary bike for at least 40 minutes without chest pain or shortness of breath.  When  saw him in September 2018 he was unaware of recurrent atrial fibrillation.  He does admit to occasional palpitations at night.  Apparently, he had been on Bystolic 10 mg but inadvertently when he called his primary physician Bystolic  was renewed at 5 mg instead of the 10 mg dose.  The palpitations seem to have occurred on the reduced dose.  I last saw him in December and resume bystolic at 10 mg.  This has improved his palpitations and blood pressure.  He continues to use CPAP with 100% compliance.   A download from August 21 through 12/20/2016 revealed 100% of use with an average sleep duration of 9 hours and 11 minutes.  His AHI is excellent at 1.7 on a 10 cm set pressure.  His amiodarone dose was reduced in December and he now is on 200 mg daily, in addition to Cartia 240 mg,  lisinopril 40 mg in addition to spironolactone 25 mg daily for hypertension.  He called the office with complaints of his tongue becoming exceedingly dry.  After several hours of CPAP use.  He has a fullface mask.  He breathes through his mouth and remotely had used a nasal mask was switched to fullface mask due to oral venting.  He had complained of a very dry mouth, making it difficult for him to continue to sleep.  He was worked in and seen as an add-on one month ago.  At that time, we readjusted his medication.  We had subsequent telephone conversations with additional adjustment.  He went to his DME company and they adjusted his tube temperature to 70.    He underwent colonoscopy and endoscopy by Dr. Earlean Shawl and was found to have 9 polyps.  Apparently the biopsy was benign.  When I last saw him early April 2019 he was continuing to have difficulty with his new CPAP machine.  Subsequently he saw [facility] as his DME company.  He was given a chinstrap.  Addition, he was advised by his daughter-in-law's dentist to use Somnifix to assist his mouth breathing.  He feels since implementing this mouth taped, he has noticed marked improvement in his sleep.  He is now sleeping at least 9 hours.  His sleep is restorative.  He has noticed his blood pressure now is significantly more controlled.  He brought with him blood pressure readings from home and his systolic blood pressures have ranged from in the low to mid 90s to the 120s.  He has been unaware of any recurrent episodes of atrial fibrillation.  He denies episodes of chest pain.  He continues to be on anticoagulation for PAF.  He had follow-up laboratory in April which showed a total cholesterol 188 triglycerides 190 LDL 93 HDL 57.  PSA was increased at 6.2.  Creatinine was 1.32.  He had previously seen Dr. Bryon Lions for primary care but apparently her office is no longer taking [employer].    When I saw him in August 2019, he was  maintaining sinus rhythm on amiodarone 200 mg daily in addition to Bystolic 5 mg.  He also was on lisinopril 40 mg in addition to spironolactone for hypertension.  He had been on low-dose simvastatin and particularly with its interaction with Cardizem and an LDL of 93 I suggested he discontinue simvastatin and started him on atorvastatin 20 mg.  He tells me he had retinal surgery in January.  He had undergone excision of a squamous cell cancer from his abdomen and was told of clear margins.  He fell on his face after he tripped on his shoelace resulting in significant ecchymoses to his nose and under his eyes bilaterally.  He continues to use CPAP.  During that evaluation, I recommended he  discontinue aspirin but continue Eliquis.  He had undergone  laparoscopic double hernia surgery on November 28, 2018 by Dr. Ninfa Linden.  He subsequently required hospitalization for development of a postoperative ileus.  When I saw him in September 2020 he denied any chest pain or awareness of any abnormal rhythm.  He did have some intermittent leg cramping at night.  He continues to be compliant with CPAP therapy.  During that evaluation with his renal insufficiency I suggested slight reduction of spironolactone to 12.5 mg daily.  He continues to be compliant with CPAP therapy.  Over the past 10 months, he has remained stable.  He denies chest pain PND orthopnea.  His pulse rate has been in the 50s.  He presents for evaluation.  Past Medical History:  Diagnosis Date  . Arthritis   . Cancer ([place])    skin cancer  . Dysrhythmia    afib  . GERD (gastroesophageal reflux disease)   . High cholesterol   . History of kidney stones   . Hx of echocardiogram 12/2010   EF 55%, There is Stage 1 diastolic dyfunction. Normal LV filling pressure. Aortic sclerosis with mild AI, mild TR  . Hypertension   . Palpitation    with ecotopy  . Sleep apnea     Past Surgical History:  Procedure Laterality Date  . CARDIOVERSION N/A  09/12/2016   Procedure: CARDIOVERSION;  Surgeon: Troy Sine, MD;  Location: [facility] ENDOSCOPY;  Service: Cardiovascular;  Laterality: N/A;  . EYE SURGERY Bilateral    cataract  . INGUINAL HERNIA REPAIR Left 11/28/2018   Procedure: LAPAROSCOPIC  LEFT INGUINAL HERNIA WITH MESH;  Surgeon: Coralie Keens, MD;  Location: [place];  Service: General;  Laterality: Left;  . MOHS SURGERY Left    skin cancer remover from left side  . RETINAL DETACHMENT SURGERY Right   . RETINAL LASER PROCEDURE Right 04/2018  . SKIN CANCER EXCISION  07/2018  . TONSILLECTOMY    . UMBILICAL HERNIA REPAIR N/A 11/28/2018   Procedure: OPEN UMBILICAL HERNIA REPAIR WITH MESH;  Surgeon: Coralie Keens, MD;  Location: [place];  Service: General;  Laterality: N/A;    No Known Allergies  Current Outpatient Medications  Medication Sig Dispense Refill  . amiodarone (PACERONE) 200 MG tablet Take 1 tab one day and 1/2 tab the next alternating (241m -1066m 180 tablet 3  . apixaban (ELIQUIS) 2.5 MG TABS tablet Take 1 tablet (2.5 mg total) by mouth 2 (two) times daily. 180 tablet 1  . atorvastatin (LIPITOR) 20 MG tablet TAKE ONE TABLET BY MOUTH ONE TIME DAILY  90 tablet 3  . BYSTOLIC 10 MG tablet TAKE ONE TABLET BY MOUTH ONE TIME DAILY  (Patient taking differently: Take 5 mg by mouth daily. TAKE ONE TABLET BY MOUTH ONE TIME DAILY 5MG) 90 tablet 0  . Cholecalciferol (VITAMIN D) 2000 UNITS tablet Take 2,000 Units by mouth daily.    . Marland Kitcheniltiazem (CARDIZEM CD) 120 MG 24 hr capsule TAKE ONE CAPSULE BY MOUTH DAILY AT BEDTIME  90 capsule 3  . lisinopril (ZESTRIL) 40 MG tablet Take 1 tablet (40 mg total) by mouth daily. 90 tablet 1  . Multiple Vitamins-Minerals (MULTIVITAMIN ADULT PO) Take 1 tablet by mouth daily.    . NON FORMULARY CPAP therapy    . Saccharomyces boulardii (PROBIOTIC) 250 MG CAPS Take 1 capsule by mouth daily.    . sodium chloride (OCEAN) 0.65 % SOLN nasal spray Place 1 spray into both nostrils as needed for congestion.      .Marland Kitchen  spironolactone (ALDACTONE) 25 MG tablet TAKE ONE TABLET BY MOUTH ONE TIME DAILY  (Patient taking differently: Take 12.5 mg by mouth once. TAKE ONE TABLET BY MOUTH ONE TIME DAILY 12.5MG) 90 tablet 2  . tamsulosin (FLOMAX) 0.4 MG CAPS capsule Take 0.4 mg by mouth daily.    . traMADol (ULTRAM) 50 MG tablet Take 1 tablet (50 mg total) by mouth every 6 (six) hours as needed for moderate pain or severe pain. 30 tablet 0   No current facility-administered medications for this visit.    Social History   Socioeconomic History  . Marital status: Married    Spouse name: Not on file  . Number of children: Not on file  . Years of education: Not on file  . Highest education level: Not on file  Occupational History  . Occupation: retired  Tobacco Use  . Smoking status: Former Smoker    Types: Cigarettes  . Smokeless tobacco: Never Used  . Tobacco comment: quit smoking in 1982  Vaping Use  . Vaping Use: Never used  Substance and Sexual Activity  . Alcohol use: No  . Drug use: No  . Sexual activity: Yes  Other Topics Concern  . Not on file  Social History Narrative  . Not on file   Social Determinants of Health   Financial Resource Strain: Low Risk   . Difficulty of Paying Living Expenses: Not hard at all  Food Insecurity: No Food Insecurity  . Worried About Charity fundraiser in the Last Year: Never true  . Ran Out of Food in the Last Year: Never true  Transportation Needs: No Transportation Needs  . Lack of Transportation (Medical): No  . Lack of Transportation (Non-Medical): No  Physical Activity: Sufficiently Active  . Days of Exercise per Week: 6 days  . Minutes of Exercise per Session: 60 min  Stress: No Stress Concern Present  . Feeling of Stress : Not at all  Social Connections:   . Frequency of Communication with Friends and Family:   . Frequency of Social Gatherings with Friends and Family:   . Attends Religious Services:   . Active Member of Clubs or Organizations:    . Attends Archivist Meetings:   Marland Kitchen Marital Status:   Intimate Partner Violence:   . Fear of Current or Ex-Partner:   . Emotionally Abused:   Marland Kitchen Physically Abused:   . Sexually Abused:     History reviewed. No pertinent family history.  Both parents are deceased.  Social history is notable in that he is married; 2 children and 2 grandchildren. 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 There is no tobacco or alcohol use.  ROS General: Negative; No fevers, chills, or night sweats;  HEENT: Negative; No changes in vision or hearing, sinus congestion, difficulty swallowing Pulmonary: Negative; No cough, wheezing, shortness of breath, hemoptysis Cardiovascular:  Negative; No chest pain, presyncope, syncope, GI: Negative; No nausea, vomiting, diarrhea, or abdominal pain GU: Positive for mild PSA elevation for which he was treated with an antibiotic.  Repeat laboratory will be forthcoming by Dr. Bryon Lions.  No dysuria, hematuria, or difficulty voiding Musculoskeletal: Negative; no myalgias, joint pain, or weakness Hematologic/Oncology: Negative; no easy bruising, bleeding Endocrine: Negative; no heat/cold intolerance; no diabetes Neuro: Negative; no changes in balance, headaches Skin: Negative; No rashes or skin lesions Psychiatric: Negative; No behavioral problems, depression Sleep: Positive for obstructive sleep apnea on CPAP therapy.  Occasional leg cramps at night the urge to  move without definitive restless leg syndrome; no snoring, daytime sleepiness, hypersomnolence, bruxism, hypnogognic hallucinations, no cataplexy Other comprehensive 14 point system review is negative.  PE: BP 120/74   Pulse (!) 51   Temp (!) 97 F (36.1 C)   Ht 5' 10" (1.778 m)   Wt 194 lb 6.4 oz (88.2 kg)   SpO2 97%   BMI 27.89 kg/m    Repeat blood pressure by me was 122/70  Wt Readings from Last 3 Encounters:  09/22/16 192 lb (87.1 kg)  09/12/16 198 lb (89.8 kg)  08/18/16 194  lb (88 kg)   General: Alert, oriented, no distress.  Skin: normal turgor, no rashes, warm and dry HEENT: Normocephalic, atraumatic. Pupils equal round and reactive to light; sclera anicteric; extraocular muscles intact;  Nose without nasal septal hypertrophy Mouth/Parynx benign; Mallinpatti scale 3 Neck: No JVD, no carotid bruits; normal carotid upstroke Lungs: clear to ausculatation and percussion; no wheezing or rales Chest wall: without tenderness to palpitation Heart: PMI not displaced, RRR, s1 s2 normal, 1/6 systolic murmur, no diastolic murmur, no rubs, gallops, thrills, or heaves Abdomen: soft, nontender; no hepatosplenomehaly, BS+; abdominal aorta nontender and not dilated by palpation. Back: no CVA tenderness Pulses 2+ Musculoskeletal: full range of motion, normal strength, no joint deformities Extremities: no clubbing cyanosis or edema, Homan's sign negative  Neurologic: grossly nonfocal; Cranial nerves grossly wnl Psychologic: Normal mood and affect   ECG (independently read by me): Sinus bradycardia at 51 bpm, first-degree AV block with PR interval 226 ms.Marland Kitchen  QTc interval 440 ms.  December 30, 2018 ECG (independently read by me): Sinus bradycardia at 55 bpm.  First-degree AV block with a.PR interval 230 msec.  Early transition.  March 2020ECG (independently read by me): Normal sinus rhythm at 62 bpm.  PR interval 200 ms.  QTc interval 444 ms.  No ectopy.  November 28, 2017 ECG (independently read by me): Sinus Bradycardia at 51, degree AV block.  No ectopy.  April 2019 ECG (independently read by me): Sinus bradycardia at 49 bpm with mild sinus arrhythmia.  First-degree AV block with a paratubal 2 to 2 ms.  No ST segment changes.  06/01/2017 ECG (independently read by me):Sinus bradycardia 58 bpm.  First-degree AV block.  05/11/2017 ECG (independently read by me): Sinus bradycardia at 67 bpm.  First degree AV block with a PR interval 214 ms.  QTC normal 434 ms.  December 2018  ECG (independently read by me):  Normal sinus rhythm at 63 bpm. PR interval 272m.  QTc nterval 466 ms. No significant ST-T changes.  September 2018 ECG (independently read by me): Sinus bradycardia 52 bpm.  QTC increased at 487 ms.  No CMV ST-T change.  June 2018 ECG (independently read by me): Normal sinus rhythm at 62 bpm.  Left axis deviation.  QTc interval 479 ms.  No significant ST-T changes  May 2018 ECG (independently read by me): atrial flib  /flutter with coarse laboratory wave.  07/13/2016 ECG (independently read by me): Atrial fibrillation with a ventricular rate in the 50s.  Mild RV conduction delay.  March 2018 ECG (independently read by me): Atrial fibrillation at 63 bpm.  QTc interval 427 ms.  March 2016 ECG (independently read by me): Sinus bradycardia 59 bpm.  Mild RV conduction delay.  Normal intervals.  March 2015 ECG: Normal sinus rhythm at 60 beats per minute. No ectopy. Nonspecific T changes in lead 3.  02/04/2013 ECG: Sinus bradycardia with PACs.  In the office today I obtained  a new download from October 29, 2017 through November 27, 2017.  During this time he has been using some to fix on a daily basis.  Compliance is 100%.  He is averaging 9 hours and 19 minutes of sleep per night.  At a 10 cm set pressure, AHI is excellent at 1.2.  There is no leak.  LABS:  BMP Latest Ref Rng & Units 08/06/2019 01/13/2019 12/04/2018  Glucose 65 - 99 mg/dL 77 82 96  BUN 8 - 27 mg/dL 23 27 24(H)  Creatinine 0.76 - 1.27 mg/dL 1.58(H) 1.48(H) 1.44(H)  BUN/Creat Ratio 10 - _0 -  Sodium 134 - 144 mmol/L 141 141 135  Potassium 3.5 - 5.2 mmol/L 4.6 5.6(H) 4.2  Chloride 96 - 106 mmol/L 104 105 102  CO2 20 - 29 mmol/L _1 Calcium 8.6 - 10.2 mg/dL 9.9 9.9 8.4(L)    Hepatic Function Latest Ref Rng & Units 08/06/2019 12/03/2018 03/15/2018  Total Protein 6.0 - 8.5 g/dL 7.1 6.8 7.0  Albumin 3.7 - 4.7 g/dL 4.4 3.6 4.3  AST 0 - 40 IU/L _2 ALT 0 - 44 IU/L _3 Alk  Phosphatase 39 - 117 IU/L 74 64 62  Total Bilirubin 0.0 - 1.2 mg/dL 0.8 1.3(H) 0.8   CBC Latest Ref Rng & Units 12/04/2018 12/03/2018 11/21/2018  WBC 4.0 - 10.5 K/uL 12.8(H) 17.0(H) 7.4  Hemoglobin 13.0 - 17.0 g/dL 11.8(L) 13.8 13.1  Hematocrit 39 - 52 % 35.7(L) 40.9 39.9  Platelets 150 - 400 K/uL 222 289 219   Lab Results  Component Value Date   MCV 93.9 12/04/2018   MCV 93.4 12/03/2018   MCV 94.5 11/21/2018   Lab Results  Component Value Date   TSH 1.700 08/06/2019     BNP No results found for: PROBNP  Lipid Panel     Component Value Date/Time   CHOL 170 08/06/2019 1015   TRIG 104 08/06/2019 1015   HDL 66 08/06/2019 1015   CHOLHDL 2.6 08/06/2019 1015   CHOLHDL 3.0 06/07/2016 1030   VLDL 28 06/07/2016 1030   LDLCALC 85 08/06/2019 1015    RADIOLOGY: No results found.  IMPRESSION:  1. Essential hypertension   2. Paroxysmal atrial fibrillation (HCC)   3. Anticoagulated   4. OSA on CPAP   5. Mixed hyperlipidemia     ASSESSMENT AND PLAN: Mr. Alonso Gapinski is a young appearing 79 year old  gentleman who has a history of hypertension, obstructive sleep apnea, palpitations with documented atrial ectopy, and hyperlipidemia.  He had developed atrial fibrillation/flutter and after titration of amiodarone, ultimately underwent successful DC cardioversion on 08/18/2016 with resolution of atrial flutter and restoration of sinus rhythm.  He has been on chronic eliquis for anticoagulation and denies bleeding.  Presently, he feels well.  He denies any chest pain PND orthopnea.  His ECG shows sinus bradycardia at 51 bpm on his current regimen of amiodarone 546 mg daily, Bystolic 5 mg, lisinopril 40 mg, and spironolactone 12.5 mg daily.  I have recommended he reduce his amiodarone and will alternate 100 every other day with 200 mg.  If he maintains sinus rhythm on this regimen in several months his dose will be further reduced to 100 mg daily.  His blood pressure today is stable.  He  continues to use CPAP for his obstructive sleep apnea.  I obtained a download today in the office which shows 100% compliance.  At 10 cm set pressure, AHI is excellent  at 1.2/h.  He continues to be on Eliquis for anticoagulation without bleeding.  He is on atorvastatin 20 mg for hyperlipidemia.  As long as he is stable, I will see him in 3 months for follow-up evaluation   Troy Sine, MD, [facility]  10/15/2019 10:08 PM

## 2019-10-13 NOTE — Patient Instructions (Signed)
Medication Instructions:  CHANGE THE WAY YOU TAKE YOUR AMIODARONE- TAKE 200MG  ONE DAY AND 100MG  THE NEXT ALTERNATING ( 1 TAB ONE DAY AND 1/2 TAB THE NEXT ALTERNATING)  *If you need a refill on your cardiac medications before your next appointment, please call your pharmacy*    Follow-Up: At [facility], you and your health needs are our priority.  As part of our continuing mission to provide you with exceptional heart care, we have created designated Provider Care Teams.  These Care Teams include your primary Cardiologist (physician) and Advanced Practice Providers (APPs -  Physician Assistants and Nurse Practitioners) who all work together to provide you with the care you need, when you need it.  We recommend signing up for the patient portal called "MyChart".  Sign up information is provided on this After Visit Summary.  MyChart is used to connect with patients for Virtual Visits (Telemedicine).  Patients are able to view lab/test results, encounter notes, upcoming appointments, etc.  Non-urgent messages can be sent to your provider as well.   To learn more about what you can do with MyChart, go to [email or web address].    Your next appointment:   3 month(s)  The format for your next appointment:   In Person  Provider:   [name], MD

## 2019-10-15 ENCOUNTER — Encounter: Payer: Self-pay | Admitting: Cardiovascular Disease

## 2019-10-20 DIAGNOSIS — G4733 Obstructive sleep apnea (adult) (pediatric): Secondary | ICD-10-CM | POA: Diagnosis not present

## 2019-11-06 ENCOUNTER — Ambulatory Visit: Payer: Medicare Other | Admitting: Nurse Practitioner

## 2019-11-06 ENCOUNTER — Other Ambulatory Visit: Payer: Self-pay | Admitting: Nurse Practitioner

## 2019-11-14 DIAGNOSIS — R350 Frequency of micturition: Secondary | ICD-10-CM | POA: Diagnosis not present

## 2020-01-13 ENCOUNTER — Other Ambulatory Visit: Payer: Self-pay | Admitting: Cardiovascular Disease

## 2020-01-14 NOTE — Telephone Encounter (Signed)
Rx request sent to pharmacy.  

## 2020-01-19 ENCOUNTER — Encounter: Payer: Self-pay | Admitting: Cardiovascular Disease

## 2020-01-19 ENCOUNTER — Ambulatory Visit: Payer: Medicare Other | Admitting: Cardiovascular Disease

## 2020-01-19 ENCOUNTER — Other Ambulatory Visit: Payer: Self-pay

## 2020-01-19 DIAGNOSIS — I10 Essential (primary) hypertension: Secondary | ICD-10-CM | POA: Diagnosis not present

## 2020-01-19 DIAGNOSIS — I480 Paroxysmal atrial fibrillation: Secondary | ICD-10-CM | POA: Diagnosis not present

## 2020-01-19 DIAGNOSIS — Z9989 Dependence on other enabling machines and devices: Secondary | ICD-10-CM | POA: Diagnosis not present

## 2020-01-19 DIAGNOSIS — E782 Mixed hyperlipidemia: Secondary | ICD-10-CM | POA: Diagnosis not present

## 2020-01-19 DIAGNOSIS — G4733 Obstructive sleep apnea (adult) (pediatric): Secondary | ICD-10-CM

## 2020-01-19 DIAGNOSIS — Z7901 Long term (current) use of anticoagulants: Secondary | ICD-10-CM

## 2020-01-19 NOTE — Patient Instructions (Signed)
Medication Instructions:  No changes   *If you need a refill on your cardiac medications before your next appointment, please call your pharmacy*   Lab Work:  Not needed    Testing/Procedures: Not needed   Follow-Up: At [facility], you and your health needs are our priority.  As part of our continuing mission to provide you with exceptional heart care, we have created designated Provider Care Teams.  These Care Teams include your primary Cardiologist (physician) and Advanced Practice Providers (APPs -  Physician Assistants and Nurse Practitioners) who all work together to provide you with the care you need, when you need it.  We recommend signing up for the patient portal called "MyChart".  Sign up information is provided on this After Visit Summary.  MyChart is used to connect with patients for Virtual Visits (Telemedicine).  Patients are able to view lab/test results, encounter notes, upcoming appointments, etc.  Non-urgent messages can be sent to your provider as well.   To learn more about what you can do with MyChart, go to [email or web address].    Your next appointment:   6 month(s)  The format for your next appointment:   In Person  Provider:   Thomas Kelly, MD     

## 2020-01-19 NOTE — Progress Notes (Signed)
Patient ID: Mario Palmer, male   DOB: February 15, 1941, 80 y.o.   MRN: [id number]    Primary M.D.: Dr. Bryon Lions  HPI: Mario Palmer is a 79 y.o. male who presents for a 3 month follow-up evaluation.  Mario Palmer has a history of hypertension, obstructive sleep apnea on CPAP therapy, GERD, and palpitations with atrial ectopy,. 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 He has history of hyperlipidemia and has been on simvastatin and had intermittently stopped this secondary to arthralgias.  An echo Doppler study in January 2015 revealed a normal systolic function with ejection fraction of 55-60%. There was evidence for mild aortic and mitral insufficiency. His left atrium was moderately dilated.  Mario Palmer was diagnosed with obstructive sleep apnea in 2010. He had mild sleep apnea with events worse in the supine position and during REM sleep. He has been on CPAP therapy since that time and initially he was started on an 8 cm water pressure but also this was later titrated to 10 cm.  He admits to 100% compliance.  His initial machine malfunctioned in 2017 and  He received a new AirSense 10 CPAP unit.  He has noticed significant improvement in this new machine.  Compared to his old one.  He admits to 100% compliance.  He goes to bed between 9:30 9:45 AM and wakes up once around 2:30 to the bathroom and typically sleeps 9-10 hours per night.  A download was obtained from his new machine from 05/07/2015 through 06/05/2015.  This revealed 100% compliance.  He is averaging 9 hours and 53 minutes per night.  He is set at a 10 cm water pressure.  His AHI was excellent at 1.1.  He uses a fullface mask.  There was no leak.    When I saw him in March 2018 his ECG demonstrated atrial fibrillation.  He was in this of questionable duration.  At that time, I initiated anticoagulation witheliquis  5 mg twice a day.  I discontinued amlodipine and started Cardizem CD 180 mg.  He underwent an echo  Doppler study on 06/23/2016 which showed an EF of 55-60%.  He did not have wall motion abnormalities.  There was mild aortic sclerosis with mildAR and mild focal calcification of the anterior mitral valve leaflet with trivial MR.  His right ventricle is mildly dilated.  His left atrium was severely dilated and measured 64 mm parasternally.  I initiated therapy with amiodarone as an antiarrhythmic agent, with the greatest likelihood for pharmacologic success.  I decrease Bystolic to 5 mg.  He started amiodarone at 200 mg first week and ultimately titrated this to 200 mg twice a day.  He has felt improved with control his ventricular rate.  He notices that his blood pressure is slightly increased on the reduced dose of Bystolic.  He admitted to 100% compliance with CPAP therapy.  He underwent successful DC cardioversion by me on 08/18/2016 with recurrence of sinus rhythm.  He has noticed improved energy since restoration of sinus rhythm.  He is unaware of any significant ectopy but does note a rare palpitation.  He has been exercising and pedals on a stationary bike for at least 40 minutes without chest pain or shortness of breath.  When  saw him in September 2018 he was unaware of recurrent atrial fibrillation.  He does admit to occasional palpitations at night.  Apparently, he had been on Bystolic 10 mg but inadvertently when he called his primary physician Bystolic  was renewed at 5 mg instead of the 10 mg dose.  The palpitations seem to have occurred on the reduced dose.  I last saw him in December and resume bystolic at 10 mg.  This has improved his palpitations and blood pressure.  He continues to use CPAP with 100% compliance.   A download from August 21 through 12/20/2016 revealed 100% of use with an average sleep duration of 9 hours and 11 minutes.  His AHI is excellent at 1.7 on a 10 cm set pressure.  His amiodarone dose was reduced in December and he now is on 200 mg daily, in addition to Cartia 240 mg,  lisinopril 40 mg in addition to spironolactone 25 mg daily for hypertension.  He called the office with complaints of his tongue becoming exceedingly dry.  After several hours of CPAP use.  He has a fullface mask.  He breathes through his mouth and remotely had used a nasal mask was switched to fullface mask due to oral venting.  He had complained of a very dry mouth, making it difficult for him to continue to sleep.  He was worked in and seen as an add-on one month ago.  At that time, we readjusted his medication.  We had subsequent telephone conversations with additional adjustment.  He went to his DME company and they adjusted his tube temperature to 70.    He underwent colonoscopy and endoscopy by Dr. Earlean Shawl and was found to have 9 polyps.  Apparently the biopsy was benign.  When I last saw him early April 2019 he was continuing to have difficulty with his new CPAP machine.  Subsequently he saw [facility] as his DME company.  He was given a chinstrap.  Addition, he was advised by his daughter-in-law's dentist to use Somnifix to assist his mouth breathing.  He feels since implementing this mouth taped, he has noticed marked improvement in his sleep.  He is now sleeping at least 9 hours.  His sleep is restorative.  He has noticed his blood pressure now is significantly more controlled.  He brought with him blood pressure readings from home and his systolic blood pressures have ranged from in the low to mid 90s to the 120s.  He has been unaware of any recurrent episodes of atrial fibrillation.  He denies episodes of chest pain.  He continues to be on anticoagulation for PAF.  He had follow-up laboratory in April which showed a total cholesterol 188 triglycerides 190 LDL 93 HDL 57.  PSA was increased at 6.2.  Creatinine was 1.32.  He had previously seen Dr. Bryon Lions for primary care but apparently her office is no longer taking [employer].    When I saw him in August 2019, he was  maintaining sinus rhythm on amiodarone 200 mg daily in addition to Bystolic 5 mg.  He also was on lisinopril 40 mg in addition to spironolactone for hypertension.  He had been on low-dose simvastatin and particularly with its interaction with Cardizem and an LDL of 93 I suggested he discontinue simvastatin and started him on atorvastatin 20 mg.  He tells me he had retinal surgery in January.  He had undergone excision of a squamous cell cancer from his abdomen and was told of clear margins.  He fell on his face after he tripped on his shoelace resulting in significant ecchymoses to his nose and under his eyes bilaterally.  He continues to use CPAP.  During that evaluation, I recommended he  discontinue aspirin but continue Eliquis.  He had undergone  laparoscopic double hernia surgery on November 28, 2018 by Dr. Ninfa Linden.  He subsequently required hospitalization for development of a postoperative ileus.  When I saw him in September 2020 he denied any chest pain or awareness of any abnormal rhythm.  He did have some intermittent leg cramping at night.  He continues to be compliant with CPAP therapy.  During that evaluation with his renal insufficiency I suggested slight reduction of spironolactone to 12.5 mg daily.  He continues to be compliant with CPAP therapy.  I saw him for follow-up evaluation in July 2021 and over the prior 10 months he had remained stable and denied chest pain, PND, orthopnea.  His pulse rate had been in the 50s.  During that evaluation, ECG revealed sinus bradycardia and I recommended he reduce his amiodarone from 200 mg daily to 100 mg alternating with 200 mg every other day.  He had continued to use CPAP therapy and a download revealed 100% compliance with an AHI of 1.2/h at 10 cm water pressure. He has continued to be on anticoagulation.    Presently he continues to feel well and denies any orthostatic symptoms.  He has been on diltiazem 120 mg at bedtime, lisinopril 40 mg daily in  addition to spironolactone 25 mg daily for blood pressure control.  He continues to be on atorvastatin for lipid therapy.  He continues to use CPAP therapy.  A new download was obtained from September 18 through January 18, 2020 which confirms 100% compliance with average use at 7 hours and 17 minutes.  At 10 cm water pressure AHI is 2.5/h.  He presents for reevaluation  Past Medical History:  Diagnosis Date  . Arthritis   . Cancer ([place])    skin cancer  . Dysrhythmia    afib  . GERD (gastroesophageal reflux disease)   . High cholesterol   . History of kidney stones   . Hx of echocardiogram 12/2010   EF 55%, There is Stage 1 diastolic dyfunction. Normal LV filling pressure. Aortic sclerosis with mild AI, mild TR  . Hypertension   . Palpitation    with ecotopy  . Sleep apnea     Past Surgical History:  Procedure Laterality Date  . CARDIOVERSION N/A 09/12/2016   Procedure: CARDIOVERSION;  Surgeon: Troy Sine, MD;  Location: [facility] ENDOSCOPY;  Service: Cardiovascular;  Laterality: N/A;  . EYE SURGERY Bilateral    cataract  . INGUINAL HERNIA REPAIR Left 11/28/2018   Procedure: LAPAROSCOPIC  LEFT INGUINAL HERNIA WITH MESH;  Surgeon: Coralie Keens, MD;  Location: [facility];  Service: General;  Laterality: Left;  . MOHS SURGERY Left    skin cancer remover from left side  . RETINAL DETACHMENT SURGERY Right   . RETINAL LASER PROCEDURE Right 04/2018  . SKIN CANCER EXCISION  07/2018  . TONSILLECTOMY    . UMBILICAL HERNIA REPAIR N/A 11/28/2018   Procedure: OPEN UMBILICAL HERNIA REPAIR WITH MESH;  Surgeon: Coralie Keens, MD;  Location: [facility];  Service: General;  Laterality: N/A;    No Known Allergies  Current Outpatient Medications  Medication Sig Dispense Refill  . amiodarone (PACERONE) 200 MG tablet Take 1 tab one day and 1/2 tab the next alternating ($RemoveBeforeDEI'200mg'gNLvolwypCXNAuWy$  '100mg'$ ) 180 tablet 3  . apixaban (ELIQUIS) 2.5 MG TABS tablet Take 1 tablet (2.5 mg total) by mouth 2 (two) times daily. 180  tablet 1  . atorvastatin (LIPITOR) 20 MG tablet TAKE ONE TABLET BY MOUTH  ONE TIME DAILY  90 tablet 3  . BYSTOLIC 10 MG tablet TAKE ONE TABLET BY MOUTH ONE TIME DAILY 90 tablet 0  . Cholecalciferol (VITAMIN D) 2000 UNITS tablet Take 2,000 Units by mouth daily.    Marland Kitchen diltiazem (CARDIZEM CD) 120 MG 24 hr capsule TAKE ONE CAPSULE BY MOUTH DAILY AT BEDTIME  90 capsule 3  . lisinopril (ZESTRIL) 40 MG tablet Take 1 tablet (40 mg total) by mouth daily. 90 tablet 1  . Multiple Vitamins-Minerals (MULTIVITAMIN ADULT PO) Take 1 tablet by mouth daily.    . NON FORMULARY CPAP therapy    . Saccharomyces boulardii (PROBIOTIC) 250 MG CAPS Take 1 capsule by mouth daily.    . sodium chloride (OCEAN) 0.65 % SOLN nasal spray Place 1 spray into both nostrils as needed for congestion.    Marland Kitchen spironolactone (ALDACTONE) 25 MG tablet Take 1 tablet (25 mg total) by mouth daily. 90 tablet 0  . tamsulosin (FLOMAX) 0.4 MG CAPS capsule Take 0.4 mg by mouth daily.     No current facility-administered medications for this visit.    Social History   Socioeconomic History  . Marital status: Married    Spouse name: Not on file  . Number of children: Not on file  . Years of education: Not on file  . Highest education level: Not on file  Occupational History  . Occupation: retired  Tobacco Use  . Smoking status: Former Smoker    Types: Cigarettes  . Smokeless tobacco: Never Used  . Tobacco comment: quit smoking in 1982  Vaping Use  . Vaping Use: Never used  Substance and Sexual Activity  . Alcohol use: No  . Drug use: No  . Sexual activity: Yes  Other Topics Concern  . Not on file  Social History Narrative  . Not on file   Social Determinants of Health   Financial Resource Strain: Low Risk   . Difficulty of Paying Living Expenses: Not hard at all  Food Insecurity: No Food Insecurity  . Worried About Charity fundraiser in the Last Year: Never true  . Ran Out of Food in the Last Year: Never true   Transportation Needs: No Transportation Needs  . Lack of Transportation (Medical): No  . Lack of Transportation (Non-Medical): No  Physical Activity: Sufficiently Active  . Days of Exercise per Week: 6 days  . Minutes of Exercise per Session: 60 min  Stress: No Stress Concern Present  . Feeling of Stress : Not at all  Social Connections:   . Frequency of Communication with Friends and Family: Not on file  . Frequency of Social Gatherings with Friends and Family: Not on file  . Attends Religious Services: Not on file  . Active Member of Clubs or Organizations: Not on file  . Attends Archivist Meetings: Not on file  . Marital Status: Not on file  Intimate Partner Violence:   . Fear of Current or Ex-Partner: Not on file  . Emotionally Abused: Not on file  . Physically Abused: Not on file  . Sexually Abused: Not on file    History reviewed. No pertinent family history.  Both parents are deceased.  Social history is notable in that he is married; 2 children and 2 grandchildren. 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 There is no tobacco or alcohol use.  ROS General: Negative; No fevers, chills, or night sweats;  HEENT: Negative; No changes in vision or hearing, sinus congestion, difficulty swallowing  Pulmonary: Negative; No cough, wheezing, shortness of breath, hemoptysis Cardiovascular:  Negative; No chest pain, presyncope, syncope, GI: Negative; No nausea, vomiting, diarrhea, or abdominal pain GU: Positive for mild PSA elevation for which he was treated with an antibiotic.  Repeat laboratory will be forthcoming by Dr. Bryon Lions.  No dysuria, hematuria, or difficulty voiding Musculoskeletal: Negative; no myalgias, joint pain, or weakness Hematologic/Oncology: Negative; no easy bruising, bleeding Endocrine: Negative; no heat/cold intolerance; no diabetes Neuro: Negative; no changes in balance, headaches Skin: Negative; No rashes or skin  lesions Psychiatric: Negative; No behavioral problems, depression Sleep: Positive for obstructive sleep apnea on CPAP therapy.  Occasional leg cramps at night the urge to move without definitive restless leg syndrome; no snoring, daytime sleepiness, hypersomnolence, bruxism, hypnogognic hallucinations, no cataplexy Other comprehensive 14 point system review is negative.  PE: BP 134/78   Pulse (!) 54   Ht _0  (1.778 m)   Wt 200 lb 9.6 oz (91 kg)   SpO2 95%   BMI 28.78 kg/m    Repeat blood pressure by me was 112/72 supine and 116/70 standing  Wt Readings from Last 3 Encounters:  09/22/16 192 lb (87.1 kg)  09/12/16 198 lb (89.8 kg)  08/18/16 194 lb (88 kg)   General: Alert, oriented, no distress.  Skin: normal turgor, no rashes, warm and dry HEENT: Normocephalic, atraumatic. Pupils equal round and reactive to light; sclera anicteric; extraocular muscles intact; Nose without nasal septal hypertrophy Mouth/Parynx benign; Mallinpatti scale 3 Neck: No JVD, no carotid bruits; normal carotid upstroke Lungs: clear to ausculatation and percussion; no wheezing or rales Chest wall: without tenderness to palpitation Heart: PMI not displaced, RRR, s1 s2 normal, 1/6 systolic murmur, no diastolic murmur, no rubs, gallops, thrills, or heaves Abdomen: soft, nontender; no hepatosplenomehaly, BS+; abdominal aorta nontender and not dilated by palpation. Back: no CVA tenderness Pulses 2+ Musculoskeletal: full range of motion, normal strength, no joint deformities Extremities: no clubbing cyanosis or edema, Homan's sign negative  Neurologic: grossly nonfocal; Cranial nerves grossly wnl Psychologic: Normal mood and affect   ECG (independently read by me): Sinus Bradycardia at 54; 1st degree AV block; PR 210 msec; QTc 440 msec  October 13, 2019 ECG (independently read by me): Sinus bradycardia at 51 bpm, first-degree AV block with PR interval 226 ms.Marland Kitchen  QTc interval 440 ms.  December 30, 2018 ECG  (independently read by me): Sinus bradycardia at 55 bpm.  First-degree AV block with a.PR interval 230 msec.  Early transition.  March 2020ECG (independently read by me): Normal sinus rhythm at 62 bpm.  PR interval 200 ms.  QTc interval 444 ms.  No ectopy.  November 28, 2017 ECG (independently read by me): Sinus Bradycardia at 51, degree AV block.  No ectopy.  April 2019 ECG (independently read by me): Sinus bradycardia at 49 bpm with mild sinus arrhythmia.  First-degree AV block with a paratubal 2 to 2 ms.  No ST segment changes.  06/01/2017 ECG (independently read by me):Sinus bradycardia 58 bpm.  First-degree AV block.  05/11/2017 ECG (independently read by me): Sinus bradycardia at 67 bpm.  First degree AV block with a PR interval 214 ms.  QTC normal 434 ms.  December 2018 ECG (independently read by me):  Normal sinus rhythm at 63 bpm. PR interval 273m.  QTc nterval 466 ms. No significant ST-T changes.  September 2018 ECG (independently read by me): Sinus bradycardia 52 bpm.  QTC increased at 487 ms.  No CMV ST-T change.  June 2018  ECG (independently read by me): Normal sinus rhythm at 62 bpm.  Left axis deviation.  QTc interval 479 ms.  No significant ST-T changes  May 2018 ECG (independently read by me): atrial flib  /flutter with coarse laboratory wave.  07/13/2016 ECG (independently read by me): Atrial fibrillation with a ventricular rate in the 50s.  Mild RV conduction delay.  March 2018 ECG (independently read by me): Atrial fibrillation at 63 bpm.  QTc interval 427 ms.  March 2016 ECG (independently read by me): Sinus bradycardia 59 bpm.  Mild RV conduction delay.  Normal intervals.  March 2015 ECG: Normal sinus rhythm at 60 beats per minute. No ectopy. Nonspecific T changes in lead 3.  02/04/2013 ECG: Sinus bradycardia with PACs.  In the office today I obtained a new download from October 29, 2017 through November 27, 2017.  During this time he has been using some to fix on a daily  basis.  Compliance is 100%.  He is averaging 9 hours and 19 minutes of sleep per night.  At a 10 cm set pressure, AHI is excellent at 1.2.  There is no leak.  LABS:  BMP Latest Ref Rng & Units 08/06/2019 01/13/2019 12/04/2018  Glucose 65 - 99 mg/dL 77 82 96  BUN 8 - 27 mg/dL 23 27 24(H)  Creatinine 0.76 - 1.27 mg/dL 1.58(H) 1.48(H) 1.44(H)  BUN/Creat Ratio 10 - _0 -  Sodium 134 - 144 mmol/L 141 141 135  Potassium 3.5 - 5.2 mmol/L 4.6 5.6(H) 4.2  Chloride 96 - 106 mmol/L 104 105 102  CO2 20 - 29 mmol/L _1 Calcium 8.6 - 10.2 mg/dL 9.9 9.9 8.4(L)    Hepatic Function Latest Ref Rng & Units 08/06/2019 12/03/2018 03/15/2018  Total Protein 6.0 - 8.5 g/dL 7.1 6.8 7.0  Albumin 3.7 - 4.7 g/dL 4.4 3.6 4.3  AST 0 - 40 IU/L _2 ALT 0 - 44 IU/L _3 Alk Phosphatase 39 - 117 IU/L 74 64 62  Total Bilirubin 0.0 - 1.2 mg/dL 0.8 1.3(H) 0.8   CBC Latest Ref Rng & Units 12/04/2018 12/03/2018 11/21/2018  WBC 4.0 - 10.5 K/uL 12.8(H) 17.0(H) 7.4  Hemoglobin 13.0 - 17.0 g/dL 11.8(L) 13.8 13.1  Hematocrit 39 - 52 % 35.7(L) 40.9 39.9  Platelets 150 - 400 K/uL 222 289 219   Lab Results  Component Value Date   MCV 93.9 12/04/2018   MCV 93.4 12/03/2018   MCV 94.5 11/21/2018   Lab Results  Component Value Date   TSH 1.700 08/06/2019     BNP No results found for: PROBNP  Lipid Panel     Component Value Date/Time   CHOL 170 08/06/2019 1015   TRIG 104 08/06/2019 1015   HDL 66 08/06/2019 1015   CHOLHDL 2.6 08/06/2019 1015   CHOLHDL 3.0 06/07/2016 1030   VLDL 28 06/07/2016 1030   LDLCALC 85 08/06/2019 1015    RADIOLOGY: No results found.  IMPRESSION:  1. Essential hypertension   2. Paroxysmal atrial fibrillation (HCC)   3. OSA on CPAP   4. Mixed hyperlipidemia   5. Anticoagulated     ASSESSMENT AND PLAN: Mario Palmer is a young appearing 79year-old  gentleman who has a history of hypertension, obstructive sleep apnea, palpitations with documented atrial ectopy,  and hyperlipidemia.  He had developed atrial fibrillation/flutter and after titration of amiodarone underwent successful DC cardioversion on 08/18/2016 with resolution of atrial flutter and restoration of sinus rhythm.  He has been on chronic eliquis for anticoagulation and denies bleeding.  When last seen, I had recommended reduction of his amiodarone dose.  Presently, he is maintaining sinus rhythm and ECG today confirms sinus bradycardia 54 bpm on amiodarone 100 mg alternating with 200 mg daily in addition to Bystolic 10 mg, and diltiazem 120 mg.  His blood pressure today is stable and in addition tell diltiazem and Bystolic he also was on lisinopril 40 mg and spironolactone 25 mg daily.  He is not orthostatic.  He remains asymptomatic without palpitations.  He continues to use CPAP therapy with excellent compliance at 10 cm water pressure AHI is stable at 2.5.  He is on atorvastatin 20 mg for hyperlipidemia with target LDL less than 70.  He is tolerating anticoagulation with low-dose Eliquis due to his age and creatinine most recently at 1.58.  I will see him in 6 months for follow-up evaluation or sooner as needed.   Troy Sine, MD, [facility]  01/21/2020 10:28 AM

## 2020-01-21 ENCOUNTER — Encounter: Payer: Self-pay | Admitting: Cardiovascular Disease

## 2020-01-25 ENCOUNTER — Other Ambulatory Visit: Payer: Self-pay | Admitting: Nurse Practitioner

## 2020-01-25 DIAGNOSIS — I10 Essential (primary) hypertension: Secondary | ICD-10-CM

## 2020-02-25 ENCOUNTER — Other Ambulatory Visit: Payer: Self-pay | Admitting: Cardiovascular Disease

## 2020-02-25 DIAGNOSIS — I10 Essential (primary) hypertension: Secondary | ICD-10-CM

## 2020-03-15 ENCOUNTER — Other Ambulatory Visit: Payer: Self-pay | Admitting: Cardiovascular Disease

## 2020-03-15 NOTE — Telephone Encounter (Signed)
Prescription refill request for Eliquis received. Indication: Atrial Fibrillation Last office visit: 01/2020  Claiborne Billings Scr: 1.58 08/2019 Age: 79 Weight:91 kg  Prescription refilled

## 2020-04-19 DIAGNOSIS — G4733 Obstructive sleep apnea (adult) (pediatric): Secondary | ICD-10-CM | POA: Diagnosis not present

## 2020-05-07 ENCOUNTER — Other Ambulatory Visit: Payer: Self-pay | Admitting: Nurse Practitioner

## 2020-06-11 ENCOUNTER — Other Ambulatory Visit: Payer: Self-pay | Admitting: Cardiovascular Disease

## 2020-06-29 ENCOUNTER — Other Ambulatory Visit: Payer: Self-pay | Admitting: Cardiovascular Disease

## 2020-07-19 ENCOUNTER — Other Ambulatory Visit: Payer: Self-pay | Admitting: Cardiovascular Disease

## 2020-07-19 NOTE — Telephone Encounter (Signed)
Rx has been sent to the pharmacy electronically. ° °

## 2020-07-21 DIAGNOSIS — G4733 Obstructive sleep apnea (adult) (pediatric): Secondary | ICD-10-CM | POA: Diagnosis not present

## 2020-08-11 ENCOUNTER — Telehealth: Payer: Self-pay | Admitting: Nurse Practitioner

## 2020-08-11 ENCOUNTER — Encounter: Payer: Medicare Other | Admitting: Nurse Practitioner

## 2020-08-11 ENCOUNTER — Ambulatory Visit: Payer: Medicare Other

## 2020-08-11 NOTE — Telephone Encounter (Signed)
Called patient to reschedule no show appt. No answer and could not leave VM

## 2020-08-25 ENCOUNTER — Ambulatory Visit: Payer: Medicare Other | Admitting: Cardiovascular Disease

## 2020-08-25 ENCOUNTER — Encounter: Payer: Self-pay | Admitting: Cardiovascular Disease

## 2020-08-25 ENCOUNTER — Other Ambulatory Visit: Payer: Self-pay

## 2020-08-25 ENCOUNTER — Other Ambulatory Visit: Payer: Self-pay | Admitting: Cardiovascular Disease

## 2020-08-25 VITALS — BP 108/70 | HR 57 | Ht 70.0 in | Wt 191.0 lb

## 2020-08-25 DIAGNOSIS — G4733 Obstructive sleep apnea (adult) (pediatric): Secondary | ICD-10-CM

## 2020-08-25 DIAGNOSIS — I10 Essential (primary) hypertension: Secondary | ICD-10-CM

## 2020-08-25 DIAGNOSIS — Z5181 Encounter for therapeutic drug level monitoring: Secondary | ICD-10-CM | POA: Diagnosis not present

## 2020-08-25 DIAGNOSIS — Z7901 Long term (current) use of anticoagulants: Secondary | ICD-10-CM

## 2020-08-25 DIAGNOSIS — E782 Mixed hyperlipidemia: Secondary | ICD-10-CM

## 2020-08-25 DIAGNOSIS — I480 Paroxysmal atrial fibrillation: Secondary | ICD-10-CM

## 2020-08-25 DIAGNOSIS — Z79899 Other long term (current) drug therapy: Secondary | ICD-10-CM

## 2020-08-25 DIAGNOSIS — Z9989 Dependence on other enabling machines and devices: Secondary | ICD-10-CM

## 2020-08-25 MED ORDER — AMIODARONE HCL 100 MG PO TABS: 100.0000 mg | ORAL_TABLET | Freq: Every day | ORAL | 3 refills | Status: DC

## 2020-08-25 NOTE — Progress Notes (Signed)
Patient ID: HISAO DOO, male   DOB: [birth year], 80 y.o.   MRN: [id number]    Primary M.D.: Dr. Bryon Lions  HPI: Mario Palmer is a 80 y.o. male who presents for a 7 month follow-up evaluation.  Mario Palmer has a history of hypertension, obstructive sleep apnea on CPAP therapy, GERD, and palpitations with atrial ectopy,. 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 He has history of hyperlipidemia and has been on simvastatin and had intermittently stopped this secondary to arthralgias.  An echo Doppler study in January 2015 revealed a normal systolic function with ejection fraction of 55-60%. There was evidence for mild aortic and mitral insufficiency. His left atrium was moderately dilated.  Mario Palmer was diagnosed with obstructive sleep apnea in 2010. He had mild sleep apnea with events worse in the supine position and during REM sleep. He has been on CPAP therapy since that time and initially he was started on an 8 cm water pressure but also this was later titrated to 10 cm.  He admits to 100% compliance.  His initial machine malfunctioned in 2017 and  He received a new AirSense 10 CPAP unit.  He has noticed significant improvement in this new machine.  Compared to his old one.  He admits to 100% compliance.  He goes to bed between 9:30 9:45 AM and wakes up once around 2:30 to the bathroom and typically sleeps 9-10 hours per night.  A download was obtained from his new machine from 05/07/2015 through 06/05/2015.  This revealed 100% compliance.  He is averaging 9 hours and 53 minutes per night.  He is set at a 10 cm water pressure.  His AHI was excellent at 1.1.  He uses a fullface mask.  There was no leak.    When I saw him in March 2018 his ECG demonstrated atrial fibrillation.  He was in this of questionable duration.  At that time, I initiated anticoagulation witheliquis  5 mg twice a day.  I discontinued amlodipine and started Cardizem CD 180 mg.  He underwent an echo  Doppler study on 06/23/2016 which showed an EF of 55-60%.  He did not have wall motion abnormalities.  There was mild aortic sclerosis with mildAR and mild focal calcification of the anterior mitral valve leaflet with trivial MR.  His right ventricle is mildly dilated.  His left atrium was severely dilated and measured 64 mm parasternally.  I initiated therapy with amiodarone as an antiarrhythmic agent, with the greatest likelihood for pharmacologic success.  I decrease Bystolic to 5 mg.  He started amiodarone at 200 mg first week and ultimately titrated this to 200 mg twice a day.  He has felt improved with control his ventricular rate.  He notices that his blood pressure is slightly increased on the reduced dose of Bystolic.  He admitted to 100% compliance with CPAP therapy.  He underwent successful DC cardioversion by me on 08/18/2016 with recurrence of sinus rhythm.  He has noticed improved energy since restoration of sinus rhythm.  He is unaware of any significant ectopy but does note a rare palpitation.  He has been exercising and pedals on a stationary bike for at least 40 minutes without chest pain or shortness of breath.  When I saw him in September 2018 he was unaware of recurrent atrial fibrillation.  He does admit to occasional palpitations at night.  Apparently, he had been on Bystolic 10 mg but inadvertently when he called his primary physician Bystolic  was renewed at 5 mg instead of the 10 mg dose.  The palpitations seem to have occurred on the reduced dose.  I last saw him in December and resume bystolic at 10 mg.  This has improved his palpitations and blood pressure.  He continues to use CPAP with 100% compliance.   A download from August 21 through 12/20/2016 revealed 100% of use with an average sleep duration of 9 hours and 11 minutes.  His AHI is excellent at 1.7 on a 10 cm set pressure.  His amiodarone dose was reduced in December and he now is on 200 mg daily, in addition to Cartia 240 mg,  lisinopril 40 mg in addition to spironolactone 25 mg daily for hypertension.  He called the office with complaints of his tongue becoming exceedingly dry.  After several hours of CPAP use.  He has a fullface mask.  He breathes through his mouth and remotely had used a nasal mask was switched to fullface mask due to oral venting.  He had complained of a very dry mouth, making it difficult for him to continue to sleep.  He was worked in and seen as an add-on one month ago.  At that time, we readjusted his medication.  We had subsequent telephone conversations with additional adjustment.  He went to his DME company and they adjusted his tube temperature to 70.    He underwent colonoscopy and endoscopy by Dr. Earlean Shawl and was found to have 9 polyps.  Apparently the biopsy was benign.  When I last saw him early April 2019 he was continuing to have difficulty with his new CPAP machine.  Subsequently he saw [facility] as his DME company.  He was given a chinstrap.  Addition, he was advised by his daughter-in-law's dentist to use Somnifix to assist his mouth breathing.  He feels since implementing this mouth taped, he has noticed marked improvement in his sleep.  He is now sleeping at least 9 hours.  His sleep is restorative.  He has noticed his blood pressure now is significantly more controlled.  He brought with him blood pressure readings from home and his systolic blood pressures have ranged from in the low to mid 90s to the 120s.  He has been unaware of any recurrent episodes of atrial fibrillation.  He denies episodes of chest pain.  He continues to be on anticoagulation for PAF.  He had follow-up laboratory in April which showed a total cholesterol 188 triglycerides 190 LDL 93 HDL 57.  PSA was increased at 6.2.  Creatinine was 1.32.  He had previously seen Dr. Bryon Lions for primary care but apparently her office is no longer taking [employer].    When I saw him in August 2019, he was  maintaining sinus rhythm on amiodarone 200 mg daily in addition to Bystolic 5 mg.  He also was on lisinopril 40 mg in addition to spironolactone for hypertension.  He had been on low-dose simvastatin and particularly with its interaction with Cardizem and an LDL of 93 I suggested he discontinue simvastatin and started him on atorvastatin 20 mg.  He tells me he had retinal surgery in January.  He had undergone excision of a squamous cell cancer from his abdomen and was told of clear margins.  He fell on his face after he tripped on his shoelace resulting in significant ecchymoses to his nose and under his eyes bilaterally.  He continues to use CPAP.  During that evaluation, I recommended he  discontinue aspirin but continue Eliquis.  He had undergone  laparoscopic double hernia surgery on November 28, 2018 by Dr. Ninfa Linden.  He subsequently required hospitalization for development of a postoperative ileus.  When I saw him in September 2020 he denied any chest pain or awareness of any abnormal rhythm.  He did have some intermittent leg cramping at night.  He continues to be compliant with CPAP therapy.  During that evaluation with his renal insufficiency I suggested slight reduction of spironolactone to 12.5 mg daily.  He continues to be compliant with CPAP therapy.  I saw him for follow-up evaluation in July 2021 and over the prior 10 months he had remained stable and denied chest pain, PND, orthopnea.  His pulse rate had been in the 50s.  During that evaluation, ECG revealed sinus bradycardia and I recommended he reduce his amiodarone from 200 mg daily to 100 mg alternating with 200 mg every other day.  He had continued to use CPAP therapy and a download revealed 100% compliance with an AHI of 1.2/h at 10 cm water pressure. He has continued to be on anticoagulation.    I last saw him in October 2021 at which time he continued to feel well and denied any orthostatic symptoms.  He continues to be on diltiazem 120 mg  at bedtime, lisinopril 40 mg daily in addition to spironolactone 25 mg daily for blood pressure control.  He was on atorvastatin for lipid therapy.  He continued to use CPAP therapy.  A new download was obtained from September 18 through January 18, 2020 which confirmed 100% compliance with average use at 7 hours and 17 minutes.  At 10 cm water pressure AHI is 2.5/h.  He was tolerating low-dose Eliquis due to his age and creatinine most recently 1.58.  Since I last saw him, well.  He denies any chest pain, shortness of breath, or anginal symptoms.  He is unaware of any heart rate irregularity.  He continues to use CPAP with excellent compliance.  A download was obtained from March 15 through July 14, 2020 which shows 100% use with average use of 5 hours and 45 minutes.  His CPAP set up date was January 2017 and as result he has a 3G wireless system.  Unfortunately in mid April 2022 with [employer] upgrade, only 5G units are able to be continued to be downloadable wirelessly.  On his download at 10 cm set pressure AHI was 7.1.  He does not have a CPAP auto unit but has a ResMed air sense 10 CPAP device.  He has not had recent laboratory.  He presents for reevaluation.  Past Medical History:  Diagnosis Date  . Arthritis   . Cancer ([place])    skin cancer  . Dysrhythmia    afib  . GERD (gastroesophageal reflux disease)   . High cholesterol   . History of kidney stones   . Hx of echocardiogram 12/2010   EF 55%, There is Stage 1 diastolic dyfunction. Normal LV filling pressure. Aortic sclerosis with mild AI, mild TR  . Hypertension   . Palpitation    with ecotopy  . Sleep apnea     Past Surgical History:  Procedure Laterality Date  . CARDIOVERSION N/A 09/12/2016   Procedure: CARDIOVERSION;  Surgeon: Troy Sine, MD;  Location: [facility] ENDOSCOPY;  Service: Cardiovascular;  Laterality: N/A;  . EYE SURGERY Bilateral    cataract  . INGUINAL HERNIA REPAIR Left 11/28/2018   Procedure: LAPAROSCOPIC  LEFT  INGUINAL HERNIA  WITH MESH;  Surgeon: Coralie Keens, MD;  Location: [place];  Service: General;  Laterality: Left;  . MOHS SURGERY Left    skin cancer remover from left side  . RETINAL DETACHMENT SURGERY Right   . RETINAL LASER PROCEDURE Right 04/2018  . SKIN CANCER EXCISION  07/2018  . TONSILLECTOMY    . UMBILICAL HERNIA REPAIR N/A 11/28/2018   Procedure: OPEN UMBILICAL HERNIA REPAIR WITH MESH;  Surgeon: Coralie Keens, MD;  Location: [place];  Service: General;  Laterality: N/A;    No Known Allergies  Current Outpatient Medications  Medication Sig Dispense Refill  . atorvastatin (LIPITOR) 20 MG tablet TAKE ONE TABLET BY MOUTH ONE TIME DAILY 90 tablet 3  . BYSTOLIC 10 MG tablet TAKE ONE TABLET BY MOUTH ONE TIME DAILY 90 tablet 0  . Cholecalciferol (VITAMIN D) 2000 UNITS tablet Take 2,000 Units by mouth daily.    Marland Kitchen diltiazem (CARDIZEM CD) 120 MG 24 hr capsule TAKE ONE CAPSULE BY MOUTH DAILY AT BEDTIME 90 capsule 0  . ELIQUIS 2.5 MG TABS tablet TAKE ONE TABLET BY MOUTH TWICE DAILY 180 tablet 1  . lisinopril (ZESTRIL) 40 MG tablet TAKE ONE TABLET BY MOUTH ONE TIME DAILY  *office visit needed for further refills* 90 tablet 3  . Multiple Vitamins-Minerals (MULTIVITAMIN ADULT PO) Take 1 tablet by mouth daily.    . NON FORMULARY CPAP therapy    . Saccharomyces boulardii (PROBIOTIC) 250 MG CAPS Take 1 capsule by mouth daily.    . sodium chloride (OCEAN) 0.65 % SOLN nasal spray Place 1 spray into both nostrils as needed for congestion.    Marland Kitchen spironolactone (ALDACTONE) 25 MG tablet TAKE ONE TABLET BY MOUTH ONE TIME DAILY 90 tablet 1  . amiodarone (PACERONE) 100 MG tablet Take 1 tablet (100 mg total) by mouth daily. 90 tablet 3  . tamsulosin (FLOMAX) 0.4 MG CAPS capsule Take 0.4 mg by mouth daily. (Patient not taking: Reported on 08/25/2020)     No current facility-administered medications for this visit.    Social History   Socioeconomic History  . Marital status: Married    Spouse name:  Not on file  . Number of children: Not on file  . Years of education: Not on file  . Highest education level: Not on file  Occupational History  . Occupation: retired  Tobacco Use  . Smoking status: Former Smoker    Types: Cigarettes  . Smokeless tobacco: Never Used  . Tobacco comment: quit smoking in 1982  Vaping Use  . Vaping Use: Never used  Substance and Sexual Activity  . Alcohol use: No  . Drug use: No  . Sexual activity: Yes  Other Topics Concern  . Not on file  Social History Narrative  . Not on file   Social Determinants of Health   Financial Resource Strain: Not on file  Food Insecurity: Not on file  Transportation Needs: Not on file  Physical Activity: Not on file  Stress: Not on file  Social Connections: Not on file  Intimate Partner Violence: Not on file    No family history on file.  Both parents are deceased.  Social history is notable in that he is married; 2 children and 2 grandchildren. 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 There is no tobacco or alcohol use.  ROS General: Negative; No fevers, chills, or night sweats;  HEENT: Negative; No changes in vision or hearing, sinus congestion, difficulty swallowing Pulmonary: Negative; No cough, wheezing, shortness of  breath, hemoptysis Cardiovascular:  Negative; No chest pain, presyncope, syncope, GI: Negative; No nausea, vomiting, diarrhea, or abdominal pain GU: Positive for mild PSA elevation for which he was treated with an antibiotic.  Repeat laboratory will be forthcoming by Dr. Bryon Lions.  No dysuria, hematuria, or difficulty voiding Musculoskeletal: Negative; no myalgias, joint pain, or weakness Hematologic/Oncology: Negative; no easy bruising, bleeding Endocrine: Negative; no heat/cold intolerance; no diabetes Neuro: Negative; no changes in balance, headaches Skin: Negative; No rashes or skin lesions Psychiatric: Negative; No behavioral problems, depression Sleep: Positive for  obstructive sleep apnea on CPAP therapy.  Occasional leg cramps at night the urge to move without definitive restless leg syndrome; no snoring, daytime sleepiness, hypersomnolence, bruxism, hypnogognic hallucinations, no cataplexy Other comprehensive 14 point system review is negative.  PE: BP 108/70   Pulse (!) 57   Ht $R'5\' 10"'WW$  (1.778 m)   Wt 191 lb (86.6 kg)   BMI 27.41 kg/m    Repeat blood pressure by me was 122/70 supine 130/70 standing  Wt Readings from Last 3 Encounters:  09/22/16 192 lb (87.1 kg)  09/12/16 198 lb (89.8 kg)  08/18/16 194 lb (88 kg)   General: Alert, oriented, no distress.  Skin: normal turgor, no rashes, warm and dry HEENT: Normocephalic, atraumatic. Pupils equal round and reactive to light; sclera anicteric; extraocular muscles intact; Nose without nasal septal hypertrophy Mouth/Parynx benign; Mallinpatti scale 3 Neck: No JVD, no carotid bruits; normal carotid upstroke Lungs: clear to ausculatation and percussion; no wheezing or rales Chest wall: without tenderness to palpitation Heart: PMI not displaced, RRR, s1 s2 normal, 1/6 systolic murmur, no diastolic murmur, no rubs, gallops, thrills, or heaves Abdomen: soft, nontender; no hepatosplenomehaly, BS+; abdominal aorta nontender and not dilated by palpation. Back: no CVA tenderness Pulses 2+ Musculoskeletal: full range of motion, normal strength, no joint deformities Extremities: no clubbing cyanosis or edema, Homan's sign negative  Neurologic: grossly nonfocal; Cranial nerves grossly wnl Psychologic: Normal mood and affect   ECG (independently read by me): Sinus bradycardia at 57, borderline 1st degree AV block    October 2021 ECG (independently read by me): Sinus Bradycardia at 54; 1st degree AV block; PR 210 msec; QTc 440 msec  October 13, 2019 ECG (independently read by me): Sinus bradycardia at 51 bpm, first-degree AV block with PR interval 226 ms.Marland Kitchen  QTc interval 440 ms.  December 30, 2018 ECG  (independently read by me): Sinus bradycardia at 55 bpm.  First-degree AV block with a.PR interval 230 msec.  Early transition.  March 2020ECG (independently read by me): Normal sinus rhythm at 62 bpm.  PR interval 200 ms.  QTc interval 444 ms.  No ectopy.  November 28, 2017 ECG (independently read by me): Sinus Bradycardia at 51, degree AV block.  No ectopy.  April 2019 ECG (independently read by me): Sinus bradycardia at 49 bpm with mild sinus arrhythmia.  First-degree AV block with a paratubal 2 to 2 ms.  No ST segment changes.  06/01/2017 ECG (independently read by me):Sinus bradycardia 58 bpm.  First-degree AV block.  05/11/2017 ECG (independently read by me): Sinus bradycardia at 67 bpm.  First degree AV block with a PR interval 214 ms.  QTC normal 434 ms.  December 2018 ECG (independently read by me):  Normal sinus rhythm at 63 bpm. PR interval 2104ms.  QTc nterval 466 ms. No significant ST-T changes.  September 2018 ECG (independently read by me): Sinus bradycardia 52 bpm.  QTC increased at 487 ms.  No CMV  ST-T change.  June 2018 ECG (independently read by me): Normal sinus rhythm at 62 bpm.  Left axis deviation.  QTc interval 479 ms.  No significant ST-T changes  May 2018 ECG (independently read by me): atrial flib  /flutter with coarse laboratory wave.  07/13/2016 ECG (independently read by me): Atrial fibrillation with a ventricular rate in the 50s.  Mild RV conduction delay.  March 2018 ECG (independently read by me): Atrial fibrillation at 63 bpm.  QTc interval 427 ms.  March 2016 ECG (independently read by me): Sinus bradycardia 59 bpm.  Mild RV conduction delay.  Normal intervals.  March 2015 ECG: Normal sinus rhythm at 60 beats per minute. No ectopy. Nonspecific T changes in lead 3.  02/04/2013 ECG: Sinus bradycardia with PACs.  In the office today I obtained a new download from October 29, 2017 through November 27, 2017.  During this time he has been using some to fix on a daily  basis.  Compliance is 100%.  He is averaging 9 hours and 19 minutes of sleep per night.  At a 10 cm set pressure, AHI is excellent at 1.2.  There is no leak.  LABS:  BMP Latest Ref Rng & Units 08/25/2020 08/06/2019 01/13/2019  Glucose 65 - 99 mg/dL 75 77 82  BUN 8 - 27 mg/dL $Remove'23 23 27  'yqHwaSJ$ Creatinine 0.76 - 1.27 mg/dL 1.44(H) 1.58(H) 1.48(H)  BUN/Creat Ratio 10 - $Re'24 16 15 18  'SCF$ Sodium 134 - 144 mmol/L 139 141 141  Potassium 3.5 - 5.2 mmol/L 4.6 4.6 5.6(H)  Chloride 96 - 106 mmol/L 102 104 105  CO2 20 - 29 mmol/L $RemoveB'24 22 24  'RINvfDkP$ Calcium 8.6 - 10.2 mg/dL 9.6 9.9 9.9    Hepatic Function Latest Ref Rng & Units 08/25/2020 08/06/2019 12/03/2018  Total Protein 6.0 - 8.5 g/dL 6.7 7.1 6.8  Albumin 3.6 - 4.6 g/dL 4.4 4.4 3.6  AST 0 - 40 IU/L $Remov'20 19 18  'EdIcRk$ ALT 0 - 44 IU/L $Remov'22 19 18  'WBCiJH$ Alk Phosphatase 44 - 121 IU/L 65 74 64  Total Bilirubin 0.0 - 1.2 mg/dL 0.7 0.8 1.3(H)   CBC Latest Ref Rng & Units 08/25/2020 12/04/2018 12/03/2018  WBC 3.4 - 10.8 x10E3/uL 9.4 12.8(H) 17.0(H)  Hemoglobin 13.0 - 17.7 g/dL 12.5(L) 11.8(L) 13.8  Hematocrit 37.5 - 51.0 % 37.6 35.7(L) 40.9  Platelets 150 - 450 x10E3/uL 302 222 289   Lab Results  Component Value Date   MCV 93 08/25/2020   MCV 93.9 12/04/2018   MCV 93.4 12/03/2018   Lab Results  Component Value Date   TSH 1.990 08/25/2020     BNP No results found for: PROBNP  Lipid Panel     Component Value Date/Time   CHOL 146 08/25/2020 1609   TRIG 155 (H) 08/25/2020 1609   HDL 48 08/25/2020 1609   CHOLHDL 3.0 08/25/2020 1609   CHOLHDL 3.0 06/07/2016 1030   VLDL 28 06/07/2016 1030   LDLCALC 71 08/25/2020 1609    RADIOLOGY: No results found.  IMPRESSION:  1. Essential hypertension   2. Paroxysmal atrial fibrillation (HCC)   3. OSA on CPAP   4. Mixed hyperlipidemia   5. Anticoagulated   6. Encounter for monitoring amiodarone therapy     ASSESSMENT AND PLAN: Mario Palmer is a young appearing 80year-old  gentleman who has a history of hypertension,  obstructive sleep apnea, palpitations with documented atrial ectopy, and hyperlipidemia.  He developed atrial fibrillation/flutter and after titration of amiodarone underwent successful DC cardioversion  on 08/18/2016 with resolution of atrial flutter and restoration of sinus rhythm.  He has been on chronic eliquis for anticoagulation and denies bleeding.  Over the years his dose of amiodarone has slightly been reduced and when last seen, he was changed to 100 mg alternating with 200 mg.  He continues to be on Bystolic 10 mg, as well as diltiazem 120 mg daily.  He also is on spironolactone 25 mg.  Blood pressure today is stable without orthostatic change.  ECG shows sinus bradycardia with borderline first-degree block.  I am recommending further reduction of his amiodarone to just 100 mg daily.  He has not had recent laboratory and I am scheduling him to undergo comprehensive metabolic panel, CBC TSH, hemoglobin A1c and fasting lipid studies.  He continues to be on atorvastatin 20 mg for hyperlipidemia.  He continues to use CPAP.  His CPAP set up date was January 2017 and he has an old ResMed air sense 10 CPAP unit which is not auto and is of 3G wireless capability.  Since mid April, machine only downloadable [place] a 5G capacity.  He qualifies for a new machine.  I am recommending we schedule him for a new ResMed air sense 11 CPAP auto unit.  In the interim, with his AHI at 7.1, I have recommended we titrate his CPAP from 10 cm to 12 cm of water pressure.  We discussed more optimal sleep duration with optimal sleep at 7 to 8 hours compared to his present sleep duration of 5 hours and 41 minutes.  I discussed with him that the new machines are on backorder and it may be 3 to 6 months before he can obtain this new device.  I will see him in 6 months for reevaluation or sooner as needed.   Troy Sine, MD, [facility]  08/30/2020 7:35 PM

## 2020-08-25 NOTE — Patient Instructions (Signed)
Medication Instructions:  DECREASE amiodarone to 100 mg daily  *If you need a refill on your cardiac medications before your next appointment, please call your pharmacy*   Lab Work: CMET, CBC, TSH, Lipid, HmgA1C today  If you have labs (blood work) drawn today and your tests are completely normal, you will receive your results only by: Marland Kitchen MyChart Message (if you have MyChart) OR . A paper copy in the mail If you have any lab test that is abnormal or we need to change your treatment, we will call you to review the results.  Follow-Up: At [facility], you and your health needs are our priority.  As part of our continuing mission to provide you with exceptional heart care, we have created designated Provider Care Teams.  These Care Teams include your primary Cardiologist (physician) and Advanced Practice Providers (APPs -  Physician Assistants and Nurse Practitioners) who all work together to provide you with the care you need, when you need it.  We recommend signing up for the patient portal called "MyChart".  Sign up information is provided on this After Visit Summary.  MyChart is used to connect with patients for Virtual Visits (Telemedicine).  Patients are able to view lab/test results, encounter notes, upcoming appointments, etc.  Non-urgent messages can be sent to your provider as well.   To learn more about what you can do with MyChart, go to [email or web address].    Your next appointment:   6 month(s)  The format for your next appointment:   In Person  Provider:   [name], MD

## 2020-08-26 LAB — COMPREHENSIVE METABOLIC PANEL
ALT: 22 IU/L (ref 0–44)
AST: 20 IU/L (ref 0–40)
Albumin/Globulin Ratio: 1.9 (ref 1.2–2.2)
Albumin: 4.4 g/dL (ref 3.6–4.6)
Alkaline Phosphatase: 65 IU/L (ref 44–121)
BUN/Creatinine Ratio: 16 (ref 10–24)
BUN: 23 mg/dL (ref 8–27)
Bilirubin Total: 0.7 mg/dL (ref 0.0–1.2)
CO2: 24 mmol/L (ref 20–29)
Calcium: 9.6 mg/dL (ref 8.6–10.2)
Chloride: 102 mmol/L (ref 96–106)
Creatinine, Ser: 1.44 mg/dL — ABNORMAL HIGH (ref 0.76–1.27)
Globulin, Total: 2.3 g/dL (ref 1.5–4.5)
Glucose: 75 mg/dL (ref 65–99)
Potassium: 4.6 mmol/L (ref 3.5–5.2)
Sodium: 139 mmol/L (ref 134–144)
Total Protein: 6.7 g/dL (ref 6.0–8.5)
eGFR: 49 mL/min/{1.73_m2} — ABNORMAL LOW (ref >59)

## 2020-08-26 LAB — LIPID PANEL
Chol/HDL Ratio: 3 ratio (ref 0.0–5.0)
Cholesterol, Total: 146 mg/dL (ref 100–199)
HDL: 48 mg/dL (ref >39)
LDL Chol Calc (NIH): 71 mg/dL (ref 0–99)
Triglycerides: 155 mg/dL — ABNORMAL HIGH (ref 0–149)
VLDL Cholesterol Cal: 27 mg/dL (ref 5–40)

## 2020-08-26 LAB — CBC
Hematocrit: 37.6 % (ref 37.5–51.0)
Hemoglobin: 12.5 g/dL — ABNORMAL LOW (ref 13.0–17.7)
MCH: 31 pg (ref 26.6–33.0)
MCHC: 33.2 g/dL (ref 31.5–35.7)
MCV: 93 fL (ref 79–97)
Platelets: 302 10*3/uL (ref 150–450)
RBC: 4.03 x10E6/uL — ABNORMAL LOW (ref 4.14–5.80)
RDW: 12.3 % (ref 11.6–15.4)
WBC: 9.4 10*3/uL (ref 3.4–10.8)

## 2020-08-26 LAB — HEMOGLOBIN A1C
Est. average glucose Bld gHb Est-mCnc: 114 mg/dL
Hgb A1c MFr Bld: 5.6 % (ref 4.8–5.6)

## 2020-08-26 LAB — TSH: TSH: 1.99 u[IU]/mL (ref 0.450–4.500)

## 2020-08-30 ENCOUNTER — Encounter: Payer: Self-pay | Admitting: Cardiovascular Disease

## 2020-09-06 ENCOUNTER — Telehealth: Payer: Self-pay

## 2020-09-06 NOTE — Telephone Encounter (Signed)
I called and left pt vm to call the office he needs to come in for an appt he has not been seen since 09/2019. YL,RMA

## 2020-09-06 NOTE — Telephone Encounter (Signed)
Patient returned my call regarding an appt he stated he is coming in for his AWV on Wednesday I offered him an appt with Raman so he wouldn't have to come back twice pt declined to see her then declined to make a second appt in general said he had seen his cardiologist. Mario Palmer

## 2020-09-08 ENCOUNTER — Ambulatory Visit: Payer: Medicare Other

## 2020-09-08 ENCOUNTER — Telehealth: Payer: Self-pay

## 2020-09-08 NOTE — Telephone Encounter (Signed)
This nurse called patient in order to perform scheduled telephonic AWV. Patient stated that he did not want to do it.

## 2020-09-09 ENCOUNTER — Ambulatory Visit (INDEPENDENT_AMBULATORY_CARE_PROVIDER_SITE_OTHER): Payer: Medicare Other | Admitting: Ophthalmology

## 2020-09-09 ENCOUNTER — Encounter (INDEPENDENT_AMBULATORY_CARE_PROVIDER_SITE_OTHER): Payer: Self-pay | Admitting: Ophthalmology

## 2020-09-09 ENCOUNTER — Other Ambulatory Visit: Payer: Self-pay

## 2020-09-09 DIAGNOSIS — H353112 Nonexudative age-related macular degeneration, right eye, intermediate dry stage: Secondary | ICD-10-CM | POA: Diagnosis not present

## 2020-09-09 DIAGNOSIS — G4733 Obstructive sleep apnea (adult) (pediatric): Secondary | ICD-10-CM

## 2020-09-09 DIAGNOSIS — Z9989 Dependence on other enabling machines and devices: Secondary | ICD-10-CM

## 2020-09-09 DIAGNOSIS — H35371 Puckering of macula, right eye: Secondary | ICD-10-CM

## 2020-09-09 NOTE — Progress Notes (Signed)
09/09/2020     CHIEF COMPLAINT Patient presents for Retina Follow Up (1 year fu OU and OCT/Pt states, "If I cover my OS and look with just my OD there is a dark spot in the center of my vision. I have been noticing  it for a while now."/Pt denies any FOL or new floaters.)   HISTORY OF PRESENT ILLNESS: Mario Palmer is a 80 y.o. male who presents to the clinic today for:   HPI     Retina Follow Up           Diagnosis: Other   Laterality: both eyes   Onset: 1 year ago   Severity: mild   Duration: 1 year   Course: stable   Comments: 1 year fu OU and OCT Pt states, "If I cover my OS and look with just my OD there is a dark spot in the center of my vision. I have been noticing  it for a while now." Pt denies any FOL or new floaters.       Last edited by Kendra Opitz, COA on 09/09/2020  9:31 AM.      Referring physician: [name], [place],  [place] [address]  HISTORICAL INFORMATION:   Selected notes from the MEDICAL RECORD NUMBER    Lab Results  Component Value Date   HGBA1C 5.6 08/25/2020     CURRENT MEDICATIONS: No current outpatient medications on file. (Ophthalmic Drugs)   No current facility-administered medications for this visit. (Ophthalmic Drugs)   Current Outpatient Medications (Other)  Medication Sig   amiodarone (PACERONE) 100 MG tablet Take 1 tablet (100 mg total) by mouth daily.   atorvastatin (LIPITOR) 20 MG tablet TAKE ONE TABLET BY MOUTH ONE TIME DAILY   BYSTOLIC 10 MG tablet TAKE ONE TABLET BY MOUTH ONE TIME DAILY   Cholecalciferol (VITAMIN D) 2000 UNITS tablet Take 2,000 Units by mouth daily.   diltiazem (CARDIZEM CD) 120 MG 24 hr capsule TAKE ONE CAPSULE BY MOUTH DAILY AT BEDTIME   ELIQUIS 2.5 MG TABS tablet TAKE ONE TABLET BY MOUTH TWICE DAILY   lisinopril (ZESTRIL) 40 MG tablet TAKE ONE TABLET BY MOUTH ONE TIME DAILY  *office visit needed for further refills*   Multiple Vitamins-Minerals (MULTIVITAMIN  ADULT PO) Take 1 tablet by mouth daily.   NON FORMULARY CPAP therapy   Saccharomyces boulardii (PROBIOTIC) 250 MG CAPS Take 1 capsule by mouth daily.   sodium chloride (OCEAN) 0.65 % SOLN nasal spray Place 1 spray into both nostrils as needed for congestion.   spironolactone (ALDACTONE) 25 MG tablet TAKE ONE TABLET BY MOUTH ONE TIME DAILY   tamsulosin (FLOMAX) 0.4 MG CAPS capsule Take 0.4 mg by mouth daily. (Patient not taking: Reported on 08/25/2020)   No current facility-administered medications for this visit. (Other)      REVIEW OF SYSTEMS:    ALLERGIES No Known Allergies  PAST MEDICAL HISTORY Past Medical History:  Diagnosis Date   Arthritis    Cancer ([place])    skin cancer   Dysrhythmia    afib   GERD (gastroesophageal reflux disease)    High cholesterol    History of kidney stones    Hx of echocardiogram 12/2010   EF 55%, There is Stage 1 diastolic dyfunction. Normal LV filling pressure. Aortic sclerosis with mild AI, mild TR   Hypertension    Palpitation    with ecotopy   Sleep apnea    Past Surgical History:  Procedure Laterality Date   CARDIOVERSION N/A 09/12/2016   Procedure: CARDIOVERSION;  Surgeon: Troy Sine, MD;  Location: [facility];  Service: Cardiovascular;  Laterality: N/A;   EYE SURGERY Bilateral    cataract   INGUINAL HERNIA REPAIR Left 11/28/2018   Procedure: LAPAROSCOPIC  LEFT INGUINAL HERNIA WITH MESH;  Surgeon: Coralie Keens, MD;  Location: [facility];  Service: General;  Laterality: Left;   MOHS SURGERY Left    skin cancer remover from left side   RETINAL DETACHMENT SURGERY Right    RETINAL LASER PROCEDURE Right 04/2018   SKIN CANCER EXCISION  07/2018   TONSILLECTOMY     UMBILICAL HERNIA REPAIR N/A 11/28/2018   Procedure: OPEN UMBILICAL HERNIA REPAIR WITH MESH;  Surgeon: Coralie Keens, MD;  Location: [facility];  Service: General;  Laterality: N/A;    FAMILY HISTORY History reviewed. No pertinent family history.  SOCIAL  HISTORY Social History   Tobacco Use   Smoking status: Former    Pack years: 0.00    Types: Cigarettes   Smokeless tobacco: Never   Tobacco comments:    quit smoking in 1982  Vaping Use   Vaping Use: Never used  Substance Use Topics   Alcohol use: No   Drug use: No         OPHTHALMIC EXAM:  Base Eye Exam     Visual Acuity (ETDRS)       Right Left   Dist [place] 20/30 -2 20/20   Dist ph [place] NI          Tonometry (Tonopen, 9:34 AM)       Right Left   Pressure 15 18         Pupils       Pupils Dark Light Shape React APD   Right PERRL 3 2 Round Slow None   Left PERRL 3 2 Round Slow None         Visual Fields (Counting fingers)       Left Right    Full Full         Extraocular Movement       Right Left    Full Full         Neuro/Psych     Oriented x3: Yes   Mood/Affect: Normal         Dilation     Both eyes: 1.0% Mydriacyl, 2.5% Phenylephrine @ 9:34 AM           Slit Lamp and Fundus Exam     External Exam       Right Left   External Normal Normal         Slit Lamp Exam       Right Left   Lids/Lashes Normal Normal   Conjunctiva/Sclera White and quiet White and quiet   Cornea Clear Clear   Anterior Chamber Deep and quiet Deep and quiet   Iris Round and reactive Round and reactive   Lens Posterior chamber intraocular lens Posterior chamber intraocular lens   Anterior Vitreous Normal Normal         Fundus Exam       Right Left   Posterior Vitreous Vitrectomized Posterior vitreous detachment   Disc Normal Normal   C/D Ratio 0.25 0.2   Macula Normal Normal   Vessels Normal Normal   Periphery Normal Normal            IMAGING AND PROCEDURES  Imaging and Procedures for 09/09/20  OCT, Retina - OU - Both  Eyes       Right Eye Quality was good. Scan locations included subfoveal. Central Foveal Thickness: 454. Progression has improved. Findings include retinal drusen , no SRF, no IRF.   Left Eye Quality was  good. Scan locations included subfoveal. Central Foveal Thickness: 351. Progression has been stable. Findings include abnormal foveal contour, retinal drusen .   Notes OD, now 3 years status post vitrectomy membrane peel for epiretinal membrane, with improved anatomy.             ASSESSMENT/PLAN:  Right epiretinal membrane Continuously improving and stable macula yet subfoveal drusen from ARMD limits acuity  Intermediate stage nonexudative age-related macular degeneration of right eye Subfoveal drusen limits acuity  OSA on CPAP Maintains compliance currently, this decreases macular hypoxia at night     ICD-10-CM   1. Right epiretinal membrane  H35.371     2. Intermediate stage nonexudative age-related macular degeneration of right eye  H35.3112 OCT, Retina - OU - Both Eyes    3. OSA on CPAP  G47.33    Z99.89       1.  Right ARMD OD limits acuity yet minor, no signs of CNVM explained the patient that continued CPAP use for the need of treatment of OSA helps decrease the nightly adverse of occurrence of macular hypoxia from untreated OSA.  2.  No other maculopathy progressive OU  3.  Ophthalmic Meds Ordered this visit:  No orders of the defined types were placed in this encounter.      Return in about 1 year (around 09/09/2021) for DILATE OU, OCT.  There are no Patient Instructions on file for this visit.   Explained the diagnoses, plan, and follow up with the patient and they expressed understanding.  Patient expressed understanding of the importance of proper follow up care.   Clent Demark Leara Rawl M.D. Diseases & Surgery of the Retina and Vitreous [facility] 09/09/20     Abbreviations: M myopia (nearsighted); A astigmatism; H hyperopia (farsighted); P presbyopia; Mrx spectacle prescription;  CTL contact lenses; OD right eye; OS left eye; OU both eyes  XT exotropia; ET esotropia; PEK punctate epithelial keratitis; PEE punctate epithelial erosions;  DES dry eye syndrome; MGD meibomian gland dysfunction; ATs artificial tears; PFAT's preservative free artificial tears; [place] nuclear sclerotic cataract; PSC posterior subcapsular cataract; ERM epi-retinal membrane; PVD posterior vitreous detachment; RD retinal detachment; DM diabetes mellitus; DR diabetic retinopathy; NPDR non-proliferative diabetic retinopathy; PDR proliferative diabetic retinopathy; CSME clinically significant macular edema; DME diabetic macular edema; dbh dot blot hemorrhages; CWS cotton wool spot; POAG primary open angle glaucoma; C/D cup-to-disc ratio; HVF humphrey visual field; GVF goldmann visual field; OCT optical coherence tomography; IOP intraocular pressure; BRVO Branch retinal vein occlusion; CRVO central retinal vein occlusion; CRAO central retinal artery occlusion; BRAO branch retinal artery occlusion; RT retinal tear; SB scleral buckle; PPV pars plana vitrectomy; VH Vitreous hemorrhage; PRP panretinal laser photocoagulation; IVK intravitreal kenalog; VMT vitreomacular traction; MH Macular hole;  NVD neovascularization of the disc; NVE neovascularization elsewhere; AREDS age related eye disease study; ARMD age related macular degeneration; POAG primary open angle glaucoma; EBMD epithelial/anterior basement membrane dystrophy; ACIOL anterior chamber intraocular lens; IOL intraocular lens; PCIOL posterior chamber intraocular lens; Phaco/IOL phacoemulsification with intraocular lens placement; [place] photorefractive keratectomy; LASIK laser assisted in situ keratomileusis; HTN hypertension; DM diabetes mellitus; COPD chronic obstructive pulmonary disease

## 2020-09-09 NOTE — Assessment & Plan Note (Signed)
Subfoveal drusen limits acuity

## 2020-09-09 NOTE — Assessment & Plan Note (Signed)
Continuously improving and stable macula yet subfoveal drusen from ARMD limits acuity

## 2020-09-09 NOTE — Assessment & Plan Note (Signed)
Maintains compliance currently, this decreases macular hypoxia at night

## 2020-09-22 ENCOUNTER — Telehealth: Payer: Self-pay | Admitting: Nurse Practitioner

## 2020-09-22 NOTE — Telephone Encounter (Signed)
Left message for patient to call back and schedule Medicare Annual Wellness Visit (AWV) either virtually or in office.   Last AWV 08/06/19  please schedule at anytime with [facility]    This should be a 45 minute visit.

## 2020-09-27 ENCOUNTER — Other Ambulatory Visit: Payer: Self-pay | Admitting: Cardiovascular Disease

## 2020-09-28 NOTE — Telephone Encounter (Signed)
86.6kg, 17m, SCR 1.44 08/25/20, lovw/kelly 08/25/20. Pt requests refill of eliquis 2.5mg  but they can qualify for 5mg . Routing to pharmd pool

## 2020-09-28 NOTE — Telephone Encounter (Signed)
At last refill noted that SCr fluctuates regularly above/below 1.5.  Because he is 10, will err of the side of caution and keep dose at 2.5 mg

## 2020-10-11 ENCOUNTER — Other Ambulatory Visit: Payer: Self-pay | Admitting: Cardiovascular Disease

## 2020-10-13 ENCOUNTER — Telehealth: Payer: Self-pay | Admitting: Nurse Practitioner

## 2020-10-13 NOTE — Telephone Encounter (Signed)
Left message for patient to call back and schedule Medicare Annual Wellness Visit (AWV) either virtually or in office.   Last AWV 08/06/19 please schedule at anytime with [facility]    This should be a 45 minute visit.

## 2020-10-19 DIAGNOSIS — G4733 Obstructive sleep apnea (adult) (pediatric): Secondary | ICD-10-CM | POA: Diagnosis not present

## 2020-11-17 DIAGNOSIS — R350 Frequency of micturition: Secondary | ICD-10-CM | POA: Diagnosis not present

## 2020-11-18 ENCOUNTER — Other Ambulatory Visit: Payer: Self-pay | Admitting: Nurse Practitioner

## 2020-11-18 ENCOUNTER — Other Ambulatory Visit: Payer: Self-pay | Admitting: Cardiovascular Disease

## 2020-12-10 ENCOUNTER — Telehealth: Payer: Self-pay | Admitting: Nurse Practitioner

## 2020-12-10 NOTE — Telephone Encounter (Signed)
Left message for patient to call back and schedule Medicare Annual Wellness Visit (AWV) either virtually or in office.  Left both my jabber number [phone] and office number    Last AWV 08/06/19  please schedule at anytime with [facility]    This should be a 45 minute visit.

## 2021-01-07 ENCOUNTER — Other Ambulatory Visit: Payer: Self-pay | Admitting: Cardiovascular Disease

## 2021-01-10 ENCOUNTER — Other Ambulatory Visit: Payer: Self-pay | Admitting: Surgery

## 2021-01-10 ENCOUNTER — Telehealth: Payer: Self-pay | Admitting: *Deleted

## 2021-01-10 DIAGNOSIS — K4090 Unilateral inguinal hernia, without obstruction or gangrene, not specified as recurrent: Secondary | ICD-10-CM | POA: Diagnosis not present

## 2021-01-10 NOTE — Telephone Encounter (Signed)
   [facility] Pre-operative Risk Assessment    Patient Name: Mario Palmer  DOB: 09-Jan-1941 MRN: [id number]     Request for surgical clearance:  What type of surgery is being performed? Inguinal Hernia repair  When is this surgery scheduled? TBD   What type of clearance is required (medical clearance vs. Pharmacy clearance to hold med vs. Both)?  Both   Are there any medications that need to be held prior to surgery and how long? Eliquis 2.5 mg  Practice name and name of physician performing surgery? [facility] ; Dr Coralie Keens  What is the office phone number? [phone]   7.   What is the office fax number? Flasher MA  8.   Anesthesia type (None, local, MAC, general) ? general   Mario Palmer 01/10/2021, 4:34 PM  _________________________________________________________________   (provider comments below)

## 2021-01-11 NOTE — Telephone Encounter (Signed)
Clinical pharmacist to review Eliquis 

## 2021-01-12 NOTE — Telephone Encounter (Signed)
I s/w the pt today and he has been advised of needing an appt for pre op clearance. Pt has been scheduled to see Coletta Memos, FNP 01/26/21 @ 2:15 at the [facility] office. I will forward notes to FNP for appt. Will send FYI to surgeon's office pt has appt. Pt would also like to be put on a wait list for possible sooner appt. Pt added to wait list.

## 2021-01-12 NOTE — Telephone Encounter (Signed)
   Name: Mario Palmer  DOB: 03/27/1941  MRN: [id number]  Primary Cardiologist: [name], MD  Chart reviewed as part of pre-operative protocol coverage. Because of Mario Palmer's past medical history and time since last visit, he will require a follow-up visit in order to better assess preoperative cardiovascular risk.  Pre-op covering staff: - Please schedule appointment and call patient to inform them. If patient already had an upcoming appointment within acceptable timeframe, please add "pre-op clearance" to the appointment notes so provider is aware. - Please contact requesting surgeon's office via preferred method (i.e, phone, fax) to inform them of need for appointment prior to surgery.  If applicable, this message will also be routed to pharmacy pool and/or primary cardiologist for input on holding anticoagulant/antiplatelet agent as requested below so that this information is available to the clearing provider at time of patient's appointment.   [place], [place]  01/12/2021, 12:51 PM

## 2021-01-12 NOTE — Telephone Encounter (Signed)
Patient with diagnosis of afib on Eliquis for anticoagulation.    Procedure: inguinal hernia repair Date of procedure: TBD  CHA2DS2-VASc Score = 3  This indicates a 3.2% annual risk of stroke. The patient's score is based upon: CHF History: 0 HTN History: 1 Diabetes History: 0 Stroke History: 0 Vascular Disease History: 0 Age Score: 2 Gender Score: 0   CrCl 39mL/min Platelet count 302K  Per office protocol, patient can hold Eliquis for 2-3 days prior to procedure.

## 2021-01-18 DIAGNOSIS — G4733 Obstructive sleep apnea (adult) (pediatric): Secondary | ICD-10-CM | POA: Diagnosis not present

## 2021-01-23 NOTE — Progress Notes (Signed)
Cardiology Clinic Note   Patient Name: Mario Palmer Date of Encounter: 01/26/2021  Primary Care Provider:  [name], [place] Primary Cardiologist:  [name], MD  Patient Profile    Mario Palmer 80 year old male presents the clinic today for follow-up evaluation of his HTN, HLD, and paroxysmal atrial fibrillation.  He also n presents for preoperative cardiac evaluation for inguinal hernia repair.  Past Medical History    Past Medical History:  Diagnosis Date   Arthritis    Cancer ([place])    skin cancer   Dysrhythmia    afib   GERD (gastroesophageal reflux disease)    High cholesterol    History of kidney stones    Hx of echocardiogram 12/2010   EF 55%, There is Stage 1 diastolic dyfunction. Normal LV filling pressure. Aortic sclerosis with mild AI, mild TR   Hypertension    Palpitation    with ecotopy   Sleep apnea    Past Surgical History:  Procedure Laterality Date   CARDIOVERSION N/A 09/12/2016   Procedure: CARDIOVERSION;  Surgeon: Troy Sine, MD;  Location: [facility] ENDOSCOPY;  Service: Cardiovascular;  Laterality: N/A;   EYE SURGERY Bilateral    cataract   INGUINAL HERNIA REPAIR Left 11/28/2018   Procedure: LAPAROSCOPIC  LEFT INGUINAL HERNIA WITH MESH;  Surgeon: Coralie Keens, MD;  Location: [facility];  Service: General;  Laterality: Left;   MOHS SURGERY Left    skin cancer remover from left side   RETINAL DETACHMENT SURGERY Right    RETINAL LASER PROCEDURE Right 04/2018   SKIN CANCER EXCISION  07/2018   TONSILLECTOMY     UMBILICAL HERNIA REPAIR N/A 11/28/2018   Procedure: OPEN UMBILICAL HERNIA REPAIR WITH MESH;  Surgeon: Coralie Keens, MD;  Location: [facility];  Service: General;  Laterality: N/A;    Allergies  No Known Allergies  History of Present Illness    Mario Palmer has a PMH of essential hypertension, paroxysmal atrial fibrillation on apixaban, OSA on CPAP, mixed hyperlipidemia, GERD, and atrial ectopy.  He was unable to  tolerate simvastatin due to arthralgias.  Echocardiogram 115 showed normal systolic function with an EF of 55-60%, mild aortic and mitral insufficiency, and left atrium moderate dilation.  He was diagnosed with OSA in 2010.  He was prescribed CPAP therapy and noticed significant improvement with his nocturia and sleep.  His EKG 2018 showed atrial fibrillation.  It was unclear how long he had been in atrial fibrillation.  He was started on apixaban twice daily at that time and his amlodipine was discontinued.  He was started on Cardizem 180.  He underwent echocardiogram 06/23/2016 which showed an EF of 55-60%.  He was trialed on amiodarone and his Bystolic was decreased to 5 mg.  He noted that his blood pressure was slightly increased with his reduction in Bystolic.  He underwent successful DCCV 08/18/2016.  He reported improved energy with normal sinus rhythm.  He did continue to note occasional episodes of palpitation that were rare.  He reported use of a stationary bike and routine exercising for about 40 minutes or more without chest pain or shortness of breath.  He underwent laparoscopic double hernia  repair 8/20 by Dr. Ninfa Linden.  He developed postoperative ileus which required hospitalization.  He was seen by Dr. Claiborne Billings 9/20 and denied chest discomfort or awareness of irregular rhythm.  He did note intermittent night cramping at night.  He remained compliant with his CPAP therapy.  He was seen in follow-up by  Dr. Claiborne Billings on 08/25/2020.  During that time he denied chest pain shortness of breath.  He denied anginal symptoms.  He reported no irregular heartbeats.  He continued to be compliant with his CPAP.  He presents the clinic today for follow-up evaluation and preoperative cardiac evaluation.  He states he feels well.  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  He also uses a stationary bike for 45 minutes most days of the week.  His biggest complaint is with his arthritic  knees.  He has been using some topical ointment which provides short-term relief.  We discussed viscous injections that he may be able to get from orthopedics.  He has not noticed any irregular or accelerated heartbeats since his cardioversion.  He denies bleeding issues.  I will send his preoperative cardiac evaluation to Dr. Ninfa Linden, give him salty 6 diet sheet, have him continue his physical activity, and plan follow-up for 6 months.  Today he denies chest pain, shortness of breath, lower extremity edema, fatigue, palpitations, melena, hematuria, hemoptysis, diaphoresis, weakness, presyncope, syncope, orthopnea, and PND.    Home Medications    Prior to Admission medications   Medication Sig Start Date End Date Taking? Authorizing Provider  amiodarone (PACERONE) 100 MG tablet Take 1 tablet (100 mg total) by mouth daily. 08/25/20   Troy Sine, MD  amiodarone (PACERONE) 200 MG tablet TAKE ONE TABLET ONE DAY AND ONE-HALF TABLET THE NEXT ALTERNATING 11/18/20   Troy Sine, MD  apixaban (ELIQUIS) 2.5 MG TABS tablet TAKE ONE TABLET BY MOUTH TWICE DAILY 09/28/20   Troy Sine, MD  atorvastatin (LIPITOR) 20 MG tablet TAKE ONE TABLET BY MOUTH ONE TIME DAILY 06/11/20   Troy Sine, MD  Cholecalciferol (VITAMIN D) 2000 UNITS tablet Take 2,000 Units by mouth daily.    [provider]  diltiazem (CARDIZEM CD) 120 MG 24 hr capsule TAKE ONE CAPSULE BY MOUTH DAILY AT BEDTIME 01/07/21   Troy Sine, MD  lisinopril (ZESTRIL) 40 MG tablet TAKE ONE TABLET BY MOUTH ONE TIME DAILY  *office visit needed for further refills* 02/25/20   Troy Sine, MD  Multiple Vitamins-Minerals (MULTIVITAMIN ADULT PO) Take 1 tablet by mouth daily.    [provider]  nebivolol (BYSTOLIC) 10 MG tablet TAKE ONE TABLET BY MOUTH ONE TIME DAILY 11/18/20   [name], FNP  NON FORMULARY CPAP therapy    [provider]  Saccharomyces boulardii (PROBIOTIC) 250 MG CAPS Take 1 capsule by mouth  daily.    [provider]  sodium chloride (OCEAN) 0.65 % SOLN nasal spray Place 1 spray into both nostrils as needed for congestion.    [provider]  spironolactone (ALDACTONE) 25 MG tablet TAKE ONE TABLET BY MOUTH ONE TIME DAILY 07/19/20   Troy Sine, MD  tamsulosin (FLOMAX) 0.4 MG CAPS capsule Take 0.4 mg by mouth daily. Patient not taking: Reported on 08/25/2020 07/10/18   [provider]    Family History    History reviewed. No pertinent family history. He indicated that his mother is deceased. He indicated that his father is deceased. He indicated that his maternal grandmother is deceased. He indicated that his maternal grandfather is deceased. He indicated that his paternal grandmother is deceased. He indicated that his paternal grandfather is deceased.  Social History    Social History   Socioeconomic History   Marital status: Married    Spouse name: Not on file   Number  of children: Not on file   Years of education: Not on file   Highest education level: Not on file  Occupational History   Occupation: retired  Tobacco Use   Smoking status: Former    Types: Cigarettes   Smokeless tobacco: Never   Tobacco comments:    quit smoking in 1982  Vaping Use   Vaping Use: Never used  Substance and Sexual Activity   Alcohol use: No   Drug use: No   Sexual activity: Yes  Other Topics Concern   Not on file  Social History Narrative   Not on file   Social Determinants of Health   Financial Resource Strain: Not on file  Food Insecurity: Not on file  Transportation Needs: Not on file  Physical Activity: Not on file  Stress: Not on file  Social Connections: Not on file  Intimate Partner Violence: Not on file     Review of Systems    General:  No chills, fever, night sweats or weight changes.  Cardiovascular:  No chest pain, dyspnea on exertion, edema, orthopnea, palpitations, paroxysmal nocturnal dyspnea. Dermatological: No rash,  lesions/masses Respiratory: No cough, dyspnea Urologic: No hematuria, dysuria Abdominal:   No nausea, vomiting, diarrhea, bright red blood per rectum, melena, or hematemesis Neurologic:  No visual changes, wkns, changes in mental status. All other systems reviewed and are otherwise negative except as noted above.  Physical Exam    VS:  BP 124/74   Pulse (!) 56   Ht 5\' 10"  (1.778 m)   Wt 194 lb (88 kg)   SpO2 97%   BMI 27.84 kg/m  , BMI Body mass index is 27.84 kg/m. GEN: Well nourished, well developed, in no acute distress. HEENT: normal. Neck: Supple, no JVD, carotid bruits, or masses. Cardiac: RRR, no murmurs, rubs, or gallops. No clubbing, cyanosis, edema.  Radials/DP/PT 2+ and equal bilaterally.  Respiratory:  Respirations regular and unlabored, clear to auscultation bilaterally. GI: Soft, nontender, nondistended, BS + x 4. MS: no deformity or atrophy. Skin: warm and dry, no rash. Neuro:  Strength and sensation are intact. Psych: Normal affect.  Accessory Clinical Findings    Recent Labs: 08/25/2020: ALT 22; BUN 23; Creatinine, Ser 1.44; Hemoglobin 12.5; Platelets 302; Potassium 4.6; Sodium 139; TSH 1.990   Recent Lipid Panel    Component Value Date/Time   CHOL 146 08/25/2020 1609   TRIG 155 (H) 08/25/2020 1609   HDL 48 08/25/2020 1609   CHOLHDL 3.0 08/25/2020 1609   CHOLHDL 3.0 06/07/2016 1030   VLDL 28 06/07/2016 1030   LDLCALC 71 08/25/2020 1609    ECG personally reviewed by me today-sinus bradycardia with first-degree AV block 56 bpm- No acute changes  Echocardiogram 06/23/2016  Study Conclusions   - Left ventricle: The cavity size was normal. Systolic function was    normal. The estimated ejection fraction was in the range of 55%    to 60%. Wall motion was normal; there were no regional wall    motion abnormalities.  - Aortic valve: Trileaflet; normal thickness, mildly calcified    leaflets. There was mild regurgitation. Regurgitation pressure     half-time: 662 ms.  - Mitral valve: Mild focal calcification of the anterior leaflet    (medial segment(s)). There was trivial regurgitation.  - Left atrium: The atrium was severely dilated.  - Right ventricle: The cavity size was mildly dilated. Wall    thickness was normal.  - Right atrium: The atrium was moderately dilated.  - Pulmonic valve: There  was trivial regurgitation.   Assessment & Plan   1.  Essential hypertension-BP today 124/74.  Well-controlled at home. Continue spironolactone, diltiazem, lisinopril Heart healthy low-sodium diet-salty 6 given Increase physical activity as tolerated  Paroxysmal atrial fibrillation-EKG today shows sinus bradycardia with first-degree AV block.  Denies recent episodes of accelerated or irregular heartbeat.  Reports compliance with apixaban and denies bleeding issues. Continue diltiazem, amiodarone, apixaban, nebivolol Heart healthy low-sodium diet-salty 6 given Increase physical activity as tolerated  Hyperlipidemia-08/25/2020: Cholesterol, Total 146; HDL 48; LDL Chol Calc (NIH) 71; Triglycerides 155 Continue atorvastatin Heart healthy low-sodium diet-salty 6 given Increase physical activity as tolerated  Obstructive sleep apnea-reports compliance with CPAP.  Waking up well rested. Continue CPAP use  Preoperative cardiac evaluation-inguinal hernia repair, [place] surgery, Dr. Coralie Keens, fax #[phone]    Primary Cardiologist: [name], MD  Chart reviewed as part of pre-operative protocol coverage. Given past medical history and time since last visit, based on ACC/AHA guidelines, Teige R[place]ree Clover would be at acceptable risk for the planned procedure without further cardiovascular testing.   Patient with diagnosis of afib on Eliquis for anticoagulation.     Procedure: inguinal hernia repair Date of procedure: TBD   CHA2DS2-VASc Score = 3  This indicates a 3.2% annual risk of stroke. The patient's score is  based upon: CHF History: 0 HTN History: 1 Diabetes History: 0 Stroke History: 0 Vascular Disease History: 0 Age Score: 2 Gender Score: 0    CrCl 14mL/min Platelet count 302K   Per office protocol, patient can hold Eliquis for 2-3 days prior to procedure.  Patient was advised that if he develops new symptoms prior to surgery to contact our office to arrange a follow-up appointment.  He verbalized understanding.  I will route this recommendation to the requesting party via Epic fax function and remove from pre-op pool.  Please call with questions.    Disposition: Follow-up with Dr. Claiborne Billings in 6 months.   Jossie Ng. Samiksha Pellicano NP-C    01/26/2021, 2:45 PM [facility] [address] Office [phone] Fax [phone]  Notice: This dictation was prepared with Dragon dictation along with smaller phrase technology. Any transcriptional errors that result from this process are unintentional and may not be corrected upon review.  I spent 14 minutes examining this patient, reviewing medications, and using patient centered shared decision making involving her cardiac care.  Prior to her visit I spent greater than 20 minutes reviewing her past medical history,  medications, and prior cardiac tests.

## 2021-01-26 ENCOUNTER — Encounter: Payer: Self-pay | Admitting: General Practice

## 2021-01-26 ENCOUNTER — Other Ambulatory Visit: Payer: Self-pay

## 2021-01-26 ENCOUNTER — Ambulatory Visit: Payer: Medicare Other | Admitting: General Practice

## 2021-01-26 VITALS — BP 124/74 | HR 56 | Ht 70.0 in | Wt 194.0 lb

## 2021-01-26 DIAGNOSIS — E782 Mixed hyperlipidemia: Secondary | ICD-10-CM

## 2021-01-26 DIAGNOSIS — G4733 Obstructive sleep apnea (adult) (pediatric): Secondary | ICD-10-CM

## 2021-01-26 DIAGNOSIS — Z9989 Dependence on other enabling machines and devices: Secondary | ICD-10-CM | POA: Diagnosis not present

## 2021-01-26 DIAGNOSIS — I480 Paroxysmal atrial fibrillation: Secondary | ICD-10-CM | POA: Diagnosis not present

## 2021-01-26 DIAGNOSIS — Z01810 Encounter for preprocedural cardiovascular examination: Secondary | ICD-10-CM

## 2021-01-26 DIAGNOSIS — I10 Essential (primary) hypertension: Secondary | ICD-10-CM

## 2021-01-26 NOTE — Patient Instructions (Signed)
Medication Instructions:  The current medical regimen is effective;  continue present plan and medications as directed. Please refer to the Current Medication list given to you today.  *If you need a refill on your cardiac medications before your next appointment, please call your pharmacy*  Lab Work:   Testing/Procedures:  NONE    NONE  Special Instructions PLEASE READ AND FOLLOW SALTY 6-ATTACHED-1,800mg  daily  PLEASE MAINTAIN PHYSICAL ACTIVITY AS TOLERATED  CLEARED FOR Inguinal Hernia repair SURGERY  JOINT [place], [place]  Follow-Up: Your next appointment:  6 month(s) In Person with You may see [name], MD IF UNAVAILABLE Mario CLEAVER, FNP-C  or one of the following Advanced Practice Providers on your designated Care Team:  Almyra Deforest, PA-C  Fabian Sharp, [place] or  Roby Lofts, PA-C    Please call our office 2 months in advance to schedule this appointment   At [facility], you and your health needs are our priority.  As part of our continuing mission to provide you with exceptional heart care, we have created designated Provider Care Teams.  These Care Teams include your primary Cardiologist (physician) and Advanced Practice Providers (APPs -  Physician Assistants and Nurse Practitioners) who all work together to provide you with the care you need, when you need it.  We recommend signing up for the patient portal called "MyChart".  Sign up information is provided on this After Visit Summary.  MyChart is used to connect with patients for Virtual Visits (Telemedicine).  Patients are able to view lab/test results, encounter notes, upcoming appointments, etc.  Non-urgent messages can be sent to your provider as well.   To learn more about what you can do with MyChart, go to [email or web address].              6 SALTY THINGS TO AVOID     1,800MG  DAILY

## 2021-02-02 NOTE — Progress Notes (Addendum)
COVID swab appointment: n/a  COVID Vaccine Completed: yes x2 Date COVID Vaccine completed: Has received booster: COVID vaccine manufacturer: [place]   Date of COVID positive in last 90 days: no  PCP - [name], FNP Cardiologist - [name], MD  Cardiac clearance 01/26/21 by Coletta Memos in Epic  Chest x-ray - n/a EKG - 01/26/21 Epic Stress Test - n/a ECHO - 2018 Cardiac Cath - n/a Pacemaker/ICD device last checked: n/a Spinal Cord Stimulator: n/a  Sleep Study - yes  CPAP - yes every night  Fasting Blood Sugar - n/a Checks Blood Sugar _____ times a day  Blood Thinner Instructions: Eliquis, 2-3 days before surgery Aspirin Instructions: Last Dose: 02/07/21  Activity level: Can go up a flight of stairs and perform activities of daily living without stopping and without symptoms of chest pain or shortness of breath.    Anesthesia review: HTN, a fib, OSA  Patient denies shortness of breath, fever, cough and chest pain at PAT appointment   Patient verbalized understanding of instructions that were given to them at the PAT appointment. Patient was also instructed that they will need to review over the PAT instructions again at home before surgery.

## 2021-02-02 NOTE — Patient Instructions (Addendum)
DUE TO COVID-19 ONLY ONE VISITOR IS ALLOWED TO COME WITH YOU AND STAY IN THE WAITING ROOM ONLY DURING PRE OP AND PROCEDURE.   **NO VISITORS ARE ALLOWED IN THE SHORT STAY AREA OR RECOVERY ROOM!!**       Your procedure is scheduled on: 02/10/21   Report to [facility] Main Entrance    Report to admitting at 8:45 AM   Call this number if you have problems the morning of surgery [phone]   Do not eat food :After Midnight.   May have liquids until 8:00 AM day of surgery  CLEAR LIQUID DIET  Foods Allowed                                                                     Foods Excluded  Water, Black Coffee and tea (no milk or creamer)           liquids that you cannot  Plain Jell-O in any flavor  (No red)                                    see through such as: Fruit ices (not with fruit pulp)                                            milk, soups, orange juice              Iced Popsicles (No red)                                               All solid food                                   Apple juices  Sports drinks like Gatorade (No red) Lightly seasoned clear broth or consume(fat free) Sugar    Oral Hygiene is also important to reduce your risk of infection.                                    Remember - BRUSH YOUR TEETH THE MORNING OF SURGERY WITH YOUR REGULAR TOOTHPASTE   Take these medicines the morning of surgery with A SIP OF WATER: Amiodarone, Lipitor, Flomax                              You may not have any metal on your body including jewelry, and body piercing             Do not wear lotions, powders, cologne, or deodorant              Men may shave face and neck.   Do not bring valuables to the hospital. [facility] IS NOT  RESPONSIBLE   FOR VALUABLES.    Patients discharged on the day of surgery will not be allowed to drive home.   Special Instructions: Bring a copy of your healthcare power of attorney and living will documents         the  day of surgery if you haven't scanned them before.              Please read over the following fact sheets you were given: IF YOU HAVE QUESTIONS ABOUT YOUR PRE-OP INSTRUCTIONS PLEASE CALL [facility] - Preparing for Surgery Before surgery, you can play an important role.  Because skin is not sterile, your skin needs to be as free of germs as possible.  You can reduce the number of germs on your skin by washing with CHG (chlorahexidine gluconate) soap before surgery.  CHG is an antiseptic cleaner which kills germs and bonds with the skin to continue killing germs even after washing. Please DO NOT use if you have an allergy to CHG or antibacterial soaps.  If your skin becomes reddened/irritated stop using the CHG and inform your nurse when you arrive at Short Stay. Do not shave (including legs and underarms) for at least 48 hours prior to the first CHG shower.  You may shave your face/neck.  Please follow these instructions carefully:  1.  Shower with CHG Soap the night before surgery and the  morning of surgery.  2.  If you choose to wash your hair, wash your hair first as usual with your normal  shampoo.  3.  After you shampoo, rinse your hair and body thoroughly to remove the shampoo.                             4.  Use CHG as you would any other liquid soap.  You can apply chg directly to the skin and wash.  Gently with a scrungie or clean washcloth.  5.  Apply the CHG Soap to your body ONLY FROM THE NECK DOWN.   Do   not use on face/ open                           Wound or open sores. Avoid contact with eyes, ears mouth and   genitals (private parts).                       Wash face,  Genitals (private parts) with your normal soap.             6.  Wash thoroughly, paying special attention to the area where your    surgery  will be performed.  7.  Thoroughly rinse your body with warm water from the neck down.  8.  DO NOT shower/wash with your normal soap after using and  rinsing off the CHG Soap.                9.  Pat yourself dry with a clean towel.            10.  Wear clean pajamas.            11.  Place clean sheets on your bed the night of your first shower and do not  sleep with pets. Day of Surgery : Do not apply any lotions/deodorants the morning of surgery.  Please wear clean clothes to the hospital/surgery  center.  FAILURE TO FOLLOW THESE INSTRUCTIONS MAY RESULT IN THE CANCELLATION OF YOUR SURGERY  PATIENT SIGNATURE_________________________________  NURSE SIGNATURE__________________________________  ________________________________________________________________________

## 2021-02-03 ENCOUNTER — Other Ambulatory Visit: Payer: Self-pay

## 2021-02-03 ENCOUNTER — Encounter (HOSPITAL_COMMUNITY)
Admission: RE | Admit: 2021-02-03 | Discharge: 2021-02-03 | Disposition: A | Discharge status: Home / Self Care | Payer: Medicare Other | Source: Ambulatory Visit | Attending: Surgery | Admitting: Surgery

## 2021-02-03 ENCOUNTER — Encounter (HOSPITAL_COMMUNITY): Payer: Self-pay

## 2021-02-03 ENCOUNTER — Telehealth: Payer: Self-pay | Admitting: *Deleted

## 2021-02-03 VITALS — BP 116/68 | HR 59 | Temp 97.6°F | Resp 18 | Ht 70.0 in | Wt 198.3 lb

## 2021-02-03 DIAGNOSIS — Z01812 Encounter for preprocedural laboratory examination: Secondary | ICD-10-CM | POA: Diagnosis not present

## 2021-02-03 DIAGNOSIS — I480 Paroxysmal atrial fibrillation: Secondary | ICD-10-CM | POA: Diagnosis not present

## 2021-02-03 DIAGNOSIS — I10 Essential (primary) hypertension: Secondary | ICD-10-CM | POA: Insufficient documentation

## 2021-02-03 LAB — CBC
HCT: 38.4 % — ABNORMAL LOW (ref 39.0–52.0)
Hemoglobin: 12.7 g/dL — ABNORMAL LOW (ref 13.0–17.0)
MCH: 32 pg (ref 26.0–34.0)
MCHC: 33.1 g/dL (ref 30.0–36.0)
MCV: 96.7 fL (ref 80.0–100.0)
Platelets: 218 10*3/uL (ref 150–400)
RBC: 3.97 MIL/uL — ABNORMAL LOW (ref 4.22–5.81)
RDW: 12.6 % (ref 11.5–15.5)
WBC: 6.7 10*3/uL (ref 4.0–10.5)
nRBC: 0 % (ref 0.0–0.2)

## 2021-02-03 LAB — BASIC METABOLIC PANEL
Anion gap: 4 — ABNORMAL LOW (ref 5–15)
BUN: 30 mg/dL — ABNORMAL HIGH (ref 8–23)
CO2: 27 mmol/L (ref 22–32)
Calcium: 9.5 mg/dL (ref 8.9–10.3)
Chloride: 107 mmol/L (ref 98–111)
Creatinine, Ser: 1.49 mg/dL — ABNORMAL HIGH (ref 0.61–1.24)
GFR, Estimated: 47 mL/min — ABNORMAL LOW (ref >60)
Glucose, Bld: 71 mg/dL (ref 70–99)
Potassium: 4.9 mmol/L (ref 3.5–5.1)
Sodium: 138 mmol/L (ref 135–145)

## 2021-02-03 NOTE — Telephone Encounter (Signed)
Called patient to come by on Monday 11/7 to bring in his CPAP machine and chip so that I can download it before his planned surgery on 11/10. He states that he will call me back once he returns home from his pre op visit to confirm a time.

## 2021-02-04 NOTE — Anesthesia Preprocedure Evaluation (Addendum)
Anesthesia Evaluation  Patient identified by MRN, date of birth, ID band Patient awake    Reviewed: Allergy & Precautions, H&P , NPO status , Patient's Chart, lab work & pertinent test results, reviewed documented beta blocker date and time   Airway Mallampati: II  TM Distance: >3 FB Neck ROM: Full    Dental no notable dental hx. (+) Teeth Intact, Dental Advisory Given   Pulmonary sleep apnea , former smoker,    Pulmonary exam normal breath sounds clear to auscultation       Cardiovascular hypertension, Pt. on medications and Pt. on home beta blockers + dysrhythmias Atrial Fibrillation  Rhythm:Regular Rate:Normal     Neuro/Psych negative neurological ROS  negative psych ROS   GI/Hepatic Neg liver ROS, GERD  Medicated,  Endo/Other  negative endocrine ROS  Renal/GU negative Renal ROS  negative genitourinary   Musculoskeletal  (+) Arthritis , Osteoarthritis,    Abdominal   Peds  Hematology negative hematology ROS (+)   Anesthesia Other Findings   Reproductive/Obstetrics negative OB ROS                           Anesthesia Physical Anesthesia Plan  ASA: 3  Anesthesia Plan: General   Post-op Pain Management:  Regional for Post-op pain   Induction: Intravenous  PONV Risk Score and Plan: 3 and Ondansetron and Dexamethasone  Airway Management Planned: Oral ETT  Additional Equipment:   Intra-op Plan:   Post-operative Plan: Extubation in OR  Informed Consent: I have reviewed the patients History and Physical, chart, labs and discussed the procedure including the risks, benefits and alternatives for the proposed anesthesia with the patient or authorized representative who has indicated his/her understanding and acceptance.     Dental advisory given  Plan Discussed with: CRNA  Anesthesia Plan Comments: (See PAT note 02/03/21, Konrad Felix Ward, PA-C)       Anesthesia Quick  Evaluation

## 2021-02-04 NOTE — Progress Notes (Signed)
Anesthesia Chart Review   Case: [id number] Date/Time: 02/10/21 1045   Procedure: HERNIA REPAIR RIGHT INGUINAL (Right)   Anesthesia type: General   Pre-op diagnosis: RIGHT INGUINAL HERNIA   Location: [facility] ROOM 01 / [facility]   Surgeons: Coralie Keens, MD       DISCUSSION:80 y.o. former smoker with h/o HTN, GERD, sleep apnea, a-fib (on Eliquis), right inguinal hernia scheduled for above procedure 02/10/21 with Dr. Coralie Keens.   Pt last seen by cardiology 01/26/2021. Per OV note, "Chart reviewed as part of pre-operative protocol coverage. Given past medical history and time since last visit, based on ACC/AHA guidelines, Mario Palmer would be at acceptable risk for the planned procedure without further cardiovascular testing.   Patient with diagnosis of afib on Eliquis for anticoagulation.   Procedure: inguinal hernia repair Date of procedure: TBD CHA2DS2-VASc Score = 3  This indicates a 3.2% annual risk of stroke. The patient's score is based upon: CHF History: 0 HTN History: 1 Diabetes History: 0 Stroke History: 0 Vascular Disease History: 0 Age Score: 2 Gender Score: 0  CrCl 74mL/min Platelet count 302K Per office protocol, patient can hold Eliquis for 2-3 days prior to procedure. Anticipate pt can proceed with planned procedure barring acute status change."  Anticipate pt can proceed with planned procedure barring acute status change.   VS: BP 116/68   Pulse (!) 59   Temp 36.4 C (Oral)   Resp 18   Ht 5\' 10"  (1.778 m)   Wt 90 kg   SpO2 97%   BMI 28.45 kg/m   PROVIDERS: [name], FNP is PCP   [name], MD is Cardiologist  LABS: Labs reviewed: Acceptable for surgery. (all labs ordered are listed, but only abnormal results are displayed)  Labs Reviewed  BASIC METABOLIC PANEL - Abnormal; Notable for the following components:      Result Value   BUN 30 (*)    Creatinine, Ser 1.49 (*)    GFR, Estimated 47 (*)    Anion gap 4 (*)    All other  components within normal limits  CBC - Abnormal; Notable for the following components:   RBC 3.97 (*)    Hemoglobin 12.7 (*)    HCT 38.4 (*)    All other components within normal limits     IMAGES:   EKG: 01/26/2021 Rate 56 bpm  Sinus bradycardia with 1st degree AV block   CV: Echo 06/23/2016 Study Conclusions   - Left ventricle: The cavity size was normal. Systolic function was    normal. The estimated ejection fraction was in the range of 55%    to 60%. Wall motion was normal; there were no regional wall    motion abnormalities.  - Aortic valve: Trileaflet; normal thickness, mildly calcified    leaflets. There was mild regurgitation. Regurgitation pressure    half-time: 662 ms.  - Mitral valve: Mild focal calcification of the anterior leaflet    (medial segment(s)). There was trivial regurgitation.  - Left atrium: The atrium was severely dilated.  - Right ventricle: The cavity size was mildly dilated. Wall    thickness was normal.  - Right atrium: The atrium was moderately dilated.  - Pulmonic valve: There was trivial regurgitation.  Past Medical History:  Diagnosis Date   Arthritis    Cancer ([place])    skin cancer   Dysrhythmia    afib   GERD (gastroesophageal reflux disease)    High cholesterol    History of kidney stones  Hx of echocardiogram 12/2010   EF 55%, There is Stage 1 diastolic dyfunction. Normal LV filling pressure. Aortic sclerosis with mild AI, mild TR   Hypertension    Palpitation    with ecotopy   Sleep apnea     Past Surgical History:  Procedure Laterality Date   CARDIOVERSION N/A 09/12/2016   Procedure: CARDIOVERSION;  Surgeon: Troy Sine, MD;  Location: [facility] ENDOSCOPY;  Service: Cardiovascular;  Laterality: N/A;   EYE SURGERY Bilateral    cataract   INGUINAL HERNIA REPAIR Left 11/28/2018   Procedure: LAPAROSCOPIC  LEFT INGUINAL HERNIA WITH MESH;  Surgeon: Coralie Keens, MD;  Location: [place];  Service: General;  Laterality: Left;    MOHS SURGERY Left    skin cancer remover from left side   RETINAL DETACHMENT SURGERY Right    RETINAL LASER PROCEDURE Right 04/2018   SKIN CANCER EXCISION  07/2018   TONSILLECTOMY     UMBILICAL HERNIA REPAIR N/A 11/28/2018   Procedure: OPEN UMBILICAL HERNIA REPAIR WITH MESH;  Surgeon: Coralie Keens, MD;  Location: [place];  Service: General;  Laterality: N/A;    MEDICATIONS:  amiodarone (PACERONE) 100 MG tablet   amiodarone (PACERONE) 200 MG tablet   apixaban (ELIQUIS) 2.5 MG TABS tablet   atorvastatin (LIPITOR) 20 MG tablet   Carboxymethylcell-Hypromellose (GENTEAL) 0.25-0.3 % GEL   Cholecalciferol (VITAMIN D) 2000 UNITS tablet   diltiazem (CARDIZEM CD) 120 MG 24 hr capsule   lisinopril (ZESTRIL) 40 MG tablet   Multiple Vitamins-Minerals (MULTIVITAMIN ADULT PO)   nebivolol (BYSTOLIC) 10 MG tablet   NON FORMULARY   Saccharomyces boulardii (PROBIOTIC) 250 MG CAPS   sodium chloride (OCEAN) 0.65 % SOLN nasal spray   spironolactone (ALDACTONE) 25 MG tablet   tamsulosin (FLOMAX) 0.4 MG CAPS capsule   No current facility-administered medications for this encounter.     Mario Felix Ward, PA-C [facility] [phone]

## 2021-02-07 ENCOUNTER — Telehealth: Payer: Self-pay | Admitting: *Deleted

## 2021-02-07 NOTE — Telephone Encounter (Signed)
Patient brought his CPAP machine in to the office today for me to download. He is scheduled to have hernia surgery on this Thursday 02/10/21. AHI is 2.4. no changes needed to his CPAP pressure. Copy of the download report given to the patient to take to his surgeon.

## 2021-02-09 NOTE — H&P (Signed)
MRN: [id number] DOB: [birth year]  Subjective   Chief Complaint: Follow-up   History of Present Illness: Mario Palmer is a 80 y.o. male who is seen  for a right inguinal hernia  I performed a laparoscopic left inguinal hernia on him in the past as well as an umbilical hernia repair with mesh. Recently, he has developed a bulge in his right inguinal area. It is causing mild discomfort. He has no nausea or vomiting. He reports that the hernia will reduce but is getting bigger. He is currently on Eliquis. He is followed closely by his cardiologist. He has otherwise been doing well and has had no issues from his other hernia sites.  Review of Systems: A complete review of systems was obtained from the patient. I have reviewed this information and discussed as appropriate with the patient. See HPI as well for other ROS.  ROS   Medical History: Past Medical History:  Diagnosis Date   Arrhythmia   Hypertension   Sleep apnea   There is no problem list on file for this patient.  Past Surgical History:  Procedure Laterality Date   HERNIA REPAIR    No Known Allergies  Current Outpatient Medications on File Prior to Visit  Medication Sig Dispense Refill   apixaban (ELIQUIS) 2.5 mg tablet Take 1 tablet by mouth 2 (two) times daily   atorvastatin (LIPITOR) 20 MG tablet Take 1 tablet by mouth once daily   lisinopriL (ZESTRIL) 40 MG tablet TAKE ONE TABLET BY MOUTH ONE TIME DAILY *office visit needed for further refills*   diltiazem (CARDIZEM) 120 MG tablet Take by mouth   No current facility-administered medications on file prior to visit.   Family History  Problem Relation Age of Onset   High blood pressure (Hypertension) Mother   Diabetes Father    Social History   Tobacco Use  Smoking Status Former Smoker   Quit date: 1980   Years since quitting: 42.8  Smokeless Tobacco Never Used    Social History   Socioeconomic History   Marital status: Married  Tobacco  Use   Smoking status: Former Smoker  Quit date: 1980  Years since quitting: 42.8   Smokeless tobacco: Never Used  Substance and Sexual Activity   Alcohol use: Never   Drug use: Never   Objective:   Vitals:   BP: 126/80  Pulse: 63  Temp: 36.5 C (97.7 F)  SpO2: 98%  Weight: 88.5 kg (195 lb 3.2 oz)  Height: 177.8 cm (5\' 10" )   Body mass index is 28.01 kg/m.  Physical Exam   He appears well on exam  He has a small, easily reducible, mildly tender right inguinal hernia. There is no evidence of recurrent left inguinal hernia or umbilical hernia  Lungs clear CV RRR Skin withou erythema Neuro grossly intact  Labs, Imaging and Diagnostic Testing:  Assessment and Plan:  Diagnoses and all orders for this visit:  Right inguinal hernia    At this point I discussed hernia repair with him in detail including his wife. I have reviewed his notes in the electronic medical records. He is eager to have the hernia fixed. Given his previous laparoscopic repair, I would need repair of this right sided hernia open with mesh. He will need to come off of his Eliquis 2 days preoperatively. We will also need to get cardiac clearance. I again explained the surgical procedure in detail. We discussed the risk which includes but not limited to bleeding, infection, injury  to surrounding structures, use of mesh, hernia recurrence, postoperative recovery, etc. Again, surgery will be scheduled once he has been cleared by cardiology.

## 2021-02-10 ENCOUNTER — Encounter (HOSPITAL_COMMUNITY)
Admission: RE | Disposition: A | Discharge status: Home / Self Care | Payer: Self-pay | Source: Home / Self Care | Attending: Surgery

## 2021-02-10 ENCOUNTER — Encounter (HOSPITAL_COMMUNITY): Payer: Self-pay | Admitting: Surgery

## 2021-02-10 ENCOUNTER — Ambulatory Visit (HOSPITAL_COMMUNITY)
Admission: RE | Admit: 2021-02-10 | Discharge: 2021-02-10 | Disposition: A | Discharge status: Home / Self Care | Payer: Medicare Other | Attending: Surgery | Admitting: Surgery

## 2021-02-10 ENCOUNTER — Ambulatory Visit (HOSPITAL_COMMUNITY): Payer: Medicare Other | Admitting: Physician Assistant

## 2021-02-10 ENCOUNTER — Ambulatory Visit (HOSPITAL_COMMUNITY): Payer: Medicare Other | Admitting: Certified Registered Nurse Anesthetist

## 2021-02-10 DIAGNOSIS — G4730 Sleep apnea, unspecified: Secondary | ICD-10-CM | POA: Diagnosis not present

## 2021-02-10 DIAGNOSIS — M1990 Unspecified osteoarthritis, unspecified site: Secondary | ICD-10-CM | POA: Diagnosis not present

## 2021-02-10 DIAGNOSIS — K4090 Unilateral inguinal hernia, without obstruction or gangrene, not specified as recurrent: Secondary | ICD-10-CM | POA: Insufficient documentation

## 2021-02-10 DIAGNOSIS — I480 Paroxysmal atrial fibrillation: Secondary | ICD-10-CM

## 2021-02-10 DIAGNOSIS — Z87891 Personal history of nicotine dependence: Secondary | ICD-10-CM | POA: Diagnosis not present

## 2021-02-10 DIAGNOSIS — Z7901 Long term (current) use of anticoagulants: Secondary | ICD-10-CM | POA: Insufficient documentation

## 2021-02-10 DIAGNOSIS — I10 Essential (primary) hypertension: Secondary | ICD-10-CM | POA: Insufficient documentation

## 2021-02-10 DIAGNOSIS — G4733 Obstructive sleep apnea (adult) (pediatric): Secondary | ICD-10-CM | POA: Diagnosis not present

## 2021-02-10 DIAGNOSIS — I4891 Unspecified atrial fibrillation: Secondary | ICD-10-CM | POA: Insufficient documentation

## 2021-02-10 DIAGNOSIS — Z9989 Dependence on other enabling machines and devices: Secondary | ICD-10-CM | POA: Diagnosis not present

## 2021-02-10 DIAGNOSIS — G8918 Other acute postprocedural pain: Secondary | ICD-10-CM | POA: Diagnosis not present

## 2021-02-10 HISTORY — PX: INGUINAL HERNIA REPAIR: SHX194

## 2021-02-10 LAB — PROTIME-INR
INR: 1 (ref 0.8–1.2)
Prothrombin Time: 13.1 seconds (ref 11.4–15.2)

## 2021-02-10 LAB — APTT: aPTT: 32 seconds (ref 24–36)

## 2021-02-10 SURGERY — REPAIR, HERNIA, INGUINAL, ADULT
Anesthesia: General | Site: Inguinal | Laterality: Right

## 2021-02-10 MED ORDER — SUCCINYLCHOLINE CHLORIDE 200 MG/10ML IV SOSY
PREFILLED_SYRINGE | INTRAVENOUS | Status: AC
  Filled 2021-02-10: qty 10

## 2021-02-10 MED ORDER — BUPIVACAINE HCL (PF) 0.25 % IJ SOLN
INTRAMUSCULAR | Status: DC | PRN
  Administered 2021-02-10: 10 mL

## 2021-02-10 MED ORDER — CHLORHEXIDINE GLUCONATE 0.12 % MT SOLN
15.0000 mL | Freq: Once | OROMUCOSAL | Status: AC
  Administered 2021-02-10: 15 mL via OROMUCOSAL

## 2021-02-10 MED ORDER — 0.9 % SODIUM CHLORIDE (POUR BTL) OPTIME
TOPICAL | Status: DC | PRN
  Administered 2021-02-10: 1000 mL

## 2021-02-10 MED ORDER — SUGAMMADEX SODIUM 200 MG/2ML IV SOLN
INTRAVENOUS | Status: DC | PRN
  Administered 2021-02-10: 200 mg via INTRAVENOUS

## 2021-02-10 MED ORDER — LACTATED RINGERS IV SOLN: INTRAVENOUS | Status: DC

## 2021-02-10 MED ORDER — PHENYLEPHRINE 40 MCG/ML (10ML) SYRINGE FOR IV PUSH (FOR BLOOD PRESSURE SUPPORT)
PREFILLED_SYRINGE | INTRAVENOUS | Status: DC | PRN
  Administered 2021-02-10: 80 ug via INTRAVENOUS

## 2021-02-10 MED ORDER — TRAMADOL HCL 50 MG PO TABS: 50.0000 mg | ORAL_TABLET | Freq: Four times a day (QID) | ORAL | 0 refills | Status: DC | PRN

## 2021-02-10 MED ORDER — BUPIVACAINE LIPOSOME 1.3 % IJ SUSP
INTRAMUSCULAR | Status: DC | PRN
  Administered 2021-02-10: 10 mL

## 2021-02-10 MED ORDER — DEXAMETHASONE SODIUM PHOSPHATE 10 MG/ML IJ SOLN
INTRAMUSCULAR | Status: DC | PRN
  Administered 2021-02-10: 7 mg via INTRAVENOUS

## 2021-02-10 MED ORDER — OXYCODONE HCL 5 MG PO TABS
ORAL_TABLET | ORAL | Status: AC
  Administered 2021-02-10: 5 mg via ORAL
  Filled 2021-02-10: qty 1

## 2021-02-10 MED ORDER — BUPIVACAINE-EPINEPHRINE (PF) 0.5% -1:200000 IJ SOLN
INTRAMUSCULAR | Status: DC | PRN
  Administered 2021-02-10: 20 mL

## 2021-02-10 MED ORDER — FENTANYL CITRATE PF 50 MCG/ML IJ SOSY
50.0000 ug | PREFILLED_SYRINGE | INTRAMUSCULAR | Status: DC
  Administered 2021-02-10: 50 ug via INTRAVENOUS
  Filled 2021-02-10: qty 2

## 2021-02-10 MED ORDER — FENTANYL CITRATE PF 50 MCG/ML IJ SOSY: 25.0000 ug | PREFILLED_SYRINGE | INTRAMUSCULAR | Status: DC | PRN

## 2021-02-10 MED ORDER — BUPIVACAINE HCL (PF) 0.25 % IJ SOLN
INTRAMUSCULAR | Status: AC
  Filled 2021-02-10: qty 10

## 2021-02-10 MED ORDER — CEFAZOLIN SODIUM-DEXTROSE 2-4 GM/100ML-% IV SOLN
2.0000 g | INTRAVENOUS | Status: AC
  Administered 2021-02-10: 2 g via INTRAVENOUS
  Filled 2021-02-10: qty 100

## 2021-02-10 MED ORDER — ORAL CARE MOUTH RINSE: 15.0000 mL | Freq: Once | OROMUCOSAL | Status: AC

## 2021-02-10 MED ORDER — OXYCODONE HCL 5 MG PO TABS: 5.0000 mg | ORAL_TABLET | ORAL | Status: AC | PRN

## 2021-02-10 MED ORDER — EPHEDRINE 5 MG/ML INJ
INTRAVENOUS | Status: AC
  Filled 2021-02-10: qty 5

## 2021-02-10 MED ORDER — ONDANSETRON HCL 4 MG/2ML IJ SOLN
INTRAMUSCULAR | Status: DC | PRN
  Administered 2021-02-10: 4 mg via INTRAVENOUS

## 2021-02-10 MED ORDER — ALBUMIN HUMAN 5 % IV SOLN
12.5000 g | Freq: Once | INTRAVENOUS | Status: AC
  Administered 2021-02-10: 12.5 g via INTRAVENOUS

## 2021-02-10 MED ORDER — PROPOFOL 10 MG/ML IV BOLUS
INTRAVENOUS | Status: DC | PRN
  Administered 2021-02-10: 20 mg via INTRAVENOUS
  Administered 2021-02-10: 110 mg via INTRAVENOUS
  Administered 2021-02-10: 20 mg via INTRAVENOUS

## 2021-02-10 MED ORDER — SUGAMMADEX SODIUM 500 MG/5ML IV SOLN
INTRAVENOUS | Status: AC
  Filled 2021-02-10: qty 5

## 2021-02-10 MED ORDER — ROCURONIUM BROMIDE 10 MG/ML (PF) SYRINGE
PREFILLED_SYRINGE | INTRAVENOUS | Status: DC | PRN
  Administered 2021-02-10: 20 mg via INTRAVENOUS

## 2021-02-10 MED ORDER — CHLORHEXIDINE GLUCONATE CLOTH 2 % EX PADS: 6.0000 | MEDICATED_PAD | Freq: Once | CUTANEOUS | Status: DC

## 2021-02-10 MED ORDER — FENTANYL CITRATE (PF) 100 MCG/2ML IJ SOLN
INTRAMUSCULAR | Status: AC
  Filled 2021-02-10: qty 2

## 2021-02-10 MED ORDER — ALBUMIN HUMAN 5 % IV SOLN
INTRAVENOUS | Status: AC
  Filled 2021-02-10: qty 250

## 2021-02-10 MED ORDER — FENTANYL CITRATE (PF) 100 MCG/2ML IJ SOLN
INTRAMUSCULAR | Status: DC | PRN
  Administered 2021-02-10 (×2): 50 ug via INTRAVENOUS

## 2021-02-10 MED ORDER — ACETAMINOPHEN 500 MG PO TABS
1000.0000 mg | ORAL_TABLET | ORAL | Status: AC
  Administered 2021-02-10: 1000 mg via ORAL
  Filled 2021-02-10: qty 2

## 2021-02-10 MED ORDER — LIDOCAINE 2% (20 MG/ML) 5 ML SYRINGE
INTRAMUSCULAR | Status: DC | PRN
  Administered 2021-02-10: 20 mg via INTRAVENOUS

## 2021-02-10 MED ORDER — SUCCINYLCHOLINE CHLORIDE 200 MG/10ML IV SOSY
PREFILLED_SYRINGE | INTRAVENOUS | Status: DC | PRN
Start: 2021-02-10 — End: 2021-02-10
  Administered 2021-02-10: 100 mg via INTRAVENOUS

## 2021-02-10 MED ORDER — ROCURONIUM BROMIDE 10 MG/ML (PF) SYRINGE
PREFILLED_SYRINGE | INTRAVENOUS | Status: AC
  Filled 2021-02-10: qty 10

## 2021-02-10 MED ORDER — MIDAZOLAM HCL 2 MG/2ML IJ SOLN
1.0000 mg | INTRAMUSCULAR | Status: DC
  Administered 2021-02-10: 1 mg via INTRAVENOUS
  Filled 2021-02-10: qty 2

## 2021-02-10 MED ORDER — EPHEDRINE SULFATE-NACL 50-0.9 MG/10ML-% IV SOSY
PREFILLED_SYRINGE | INTRAVENOUS | Status: DC | PRN
  Administered 2021-02-10: 5 mg via INTRAVENOUS
  Administered 2021-02-10: 7.5 mg via INTRAVENOUS
  Administered 2021-02-10: 5 mg via INTRAVENOUS

## 2021-02-10 SURGICAL SUPPLY — 35 items
ADH SKN CLS APL DERMABOND .7 (GAUZE/BANDAGES/DRESSINGS) ×1
APL PRP STRL LF DISP 70% ISPRP (MISCELLANEOUS) ×1
BAG COUNTER SPONGE SURGICOUNT (BAG) IMPLANT
BAG SPNG CNTER NS LX DISP (BAG)
BLADE SURG 15 STRL LF DISP TIS (BLADE) ×1 IMPLANT
BLADE SURG 15 STRL SS (BLADE) ×2
CHLORAPREP W/TINT 26 (MISCELLANEOUS) ×2 IMPLANT
DECANTER SPIKE VIAL GLASS SM (MISCELLANEOUS) IMPLANT
DERMABOND ADVANCED (GAUZE/BANDAGES/DRESSINGS) ×1
DERMABOND ADVANCED .7 DNX12 (GAUZE/BANDAGES/DRESSINGS) ×1 IMPLANT
DRAIN PENROSE 0.5X18 (DRAIN) ×2 IMPLANT
DRAPE LAPAROTOMY TRNSV 102X78 (DRAPES) ×2 IMPLANT
ELECT REM PT RETURN 15FT ADLT (MISCELLANEOUS) ×2 IMPLANT
GAUZE SPONGE 4X4 12PLY STRL (GAUZE/BANDAGES/DRESSINGS) IMPLANT
GLOVE SURG ENC MOIS LTX SZ7.5 (GLOVE) ×2 IMPLANT
GOWN STRL REUS W/TWL XL LVL3 (GOWN DISPOSABLE) ×4 IMPLANT
KIT BASIN OR (CUSTOM PROCEDURE TRAY) ×2 IMPLANT
KIT TURNOVER KIT A (KITS) IMPLANT
MESH PARIETEX PROGRIP RIGHT (Mesh General) ×1 IMPLANT
NDL HYPO 25X1 1.5 SAFETY (NEEDLE) ×1 IMPLANT
NEEDLE HYPO 25X1 1.5 SAFETY (NEEDLE) ×2 IMPLANT
PACK BASIC VI WITH GOWN DISP (CUSTOM PROCEDURE TRAY) ×2 IMPLANT
PENCIL SMOKE EVACUATOR (MISCELLANEOUS) IMPLANT
SPONGE T-LAP 4X18 ~~LOC~~+RFID (SPONGE) ×2 IMPLANT
SUT MNCRL AB 4-0 PS2 18 (SUTURE) ×2 IMPLANT
SUT SILK 2 0 SH (SUTURE) ×1 IMPLANT
SUT VIC AB 2-0 CT1 27 (SUTURE) ×2
SUT VIC AB 2-0 CT1 TAPERPNT 27 (SUTURE) IMPLANT
SUT VIC AB 3-0 54XBRD REEL (SUTURE) IMPLANT
SUT VIC AB 3-0 BRD 54 (SUTURE)
SUT VIC AB 3-0 SH 27 (SUTURE)
SUT VIC AB 3-0 SH 27XBRD (SUTURE) IMPLANT
SYR CONTROL 10ML LL (SYRINGE) ×2 IMPLANT
TOWEL OR 17X26 10 PK STRL BLUE (TOWEL DISPOSABLE) ×2 IMPLANT
TOWEL OR NON WOVEN STRL DISP B (DISPOSABLE) ×2 IMPLANT

## 2021-02-10 NOTE — Op Note (Signed)
HERNIA REPAIR RIGHT INGUINAL  Procedure Note  Mario Palmer 02/10/2021   Pre-op Diagnosis: RIGHT INGUINAL HERNIA     Post-op Diagnosis: same  Procedure(s): RIGHT INGUINAL HERNIA REPAIR WITH MESH  Surgeon(s): Coralie Keens, MD  Anesthesia: General  Staff:  Circulator: Bartholomew Boards, RN Scrub Person: Lois Huxley; Albina Billet, CST  Estimated Blood Loss: Minimal               Findings: The patient was found to have a direct right inguinal hernia which was repaired with a large piece of Prolene ProGrip mesh from [employer]  Procedure: The patient was brought to operating identifies correct patient.  He is placed upon the operating table general anesthesia was induced.  His abdomen was prepped and draped in usual sterile fashion.  I made a longitudinal incision with a scalpel in the right inguinal area.  I dissected down through Scarpa's fascia with electrocautery.  The external bleak fascia was then identified and opened toward the internal and external ring.  I controlled the testicular cord and structures with a Penrose drain.  He had a large lipoma of the cord which I excised with the cautery and a 2-0 silk suture.  He had a large direct hernia sac.  I excised part of the sac and reduced all contents back to the abdominal cavity.  I then reinforced the inguinal floor with several 2-0 silk sutures.  I then brought a piece of large Prolene ProGrip mesh from [employer] onto the floor.  I placed against the pubic tubercle and then brought around the cord structures covering the inguinal floor.  I then sutured the mesh in place in several locations with 2-0 Vicryl sutures.  Wide coverage of the inguinal floor as well as the internal ring appeared to be achieved.  I then closed the external bleak fascia over the top of the mesh with a running 2-0 Vicryl suture.  I anesthetized the fascia and subtenons tissue and skin with Marcaine.  He had received a preoperative tap block by  anesthesiology.  I then closed Harper's fascia with interrupted 3-0 Vicryl sutures and closed the skin with running 4-0 Monocryl.  Dermabond was then applied.  The patient tolerated the procedure well.  All the counts were correct at the end of the procedure.  The patient was then extubated in the operating room and taken in stable addition to the recovery room.          Coralie Keens   Date: 02/10/2021  Time: 10:22 AM

## 2021-02-10 NOTE — Anesthesia Procedure Notes (Signed)
Anesthesia Regional Block: TAP block   Pre-Anesthetic Checklist: , timeout performed,  Correct Patient, Correct Site, Correct Laterality,  Correct Procedure, Correct Position, site marked,  Risks and benefits discussed,  Pre-op evaluation,  At surgeon's request and post-op pain management  Laterality: Right  Prep: Maximum Sterile Barrier Precautions used, chloraprep       Needles:  Injection technique: Single-shot  Needle Type: Echogenic Stimulator Needle     Needle Length: 9cm  Needle Gauge: 21     Additional Needles:   Narrative:  Start time: 02/10/2021 9:10 AM End time: 02/10/2021 9:20 AM Injection made incrementally with aspirations every 5 mL.  Performed by: Personally  Anesthesiologist: Roderic Palau, MD

## 2021-02-10 NOTE — Progress Notes (Signed)
Assisted Dr. Roderic Palau  with  Right Tap block. Side rails up, monitors on throughout procedure. See vital signs in flow sheet. Tolerated Procedure well.

## 2021-02-10 NOTE — Anesthesia Procedure Notes (Addendum)
Procedure Name: Intubation Date/Time: 02/10/2021 9:39 AM Performed by: West Pugh, CRNA Pre-anesthesia Checklist: Patient identified, Emergency Drugs available, Suction available, Patient being monitored and Timeout performed Patient Re-evaluated:Patient Re-evaluated prior to induction Oxygen Delivery Method: Circle system utilized Preoxygenation: Pre-oxygenation with 100% oxygen Induction Type: IV induction, Rapid sequence and Cricoid Pressure applied Laryngoscope Size: Mac and 4 Grade View: Grade II Tube type: Oral Tube size: 7.5 mm Number of attempts: 1 Airway Equipment and Method: Stylet Placement Confirmation: ETT inserted through vocal cords under direct vision, positive ETCO2, CO2 detector and breath sounds checked- equal and bilateral Secured at: 23 cm Tube secured with: Tape Dental Injury: Teeth and Oropharynx as per pre-operative assessment

## 2021-02-10 NOTE — Interval H&P Note (Signed)
History and Physical Interval Note: no change in H and P  02/10/2021 8:40 AM  Mario Palmer  has presented today for surgery, with the diagnosis of RIGHT INGUINAL HERNIA.  The various methods of treatment have been discussed with the patient and family. After consideration of risks, benefits and other options for treatment, the patient has consented to  Procedure(s): HERNIA REPAIR RIGHT INGUINAL (Right) as a surgical intervention.  The patient's history has been reviewed, patient examined, no change in status, stable for surgery.  I have reviewed the patient's chart and labs.  Questions were answered to the patient's satisfaction.     Coralie Keens

## 2021-02-10 NOTE — Transfer of Care (Signed)
Immediate Anesthesia Transfer of Care Note  Patient: Mario Palmer  Procedure(s) Performed: HERNIA REPAIR RIGHT INGUINAL (Right: Inguinal)  Patient Location: PACU  Anesthesia Type:General  Level of Consciousness: drowsy and responds to stimulation  Airway & Oxygen Therapy: Patient Spontanous Breathing and Patient connected to face mask oxygen  Post-op Assessment: Report given to RN and Post -op Vital signs reviewed and stable  Post vital signs: Reviewed and stable  Last Vitals:  Vitals Value Taken Time  BP 94/55 02/10/21 1025  Temp    Pulse 63 02/10/21 1028  Resp 13 02/10/21 1028  SpO2 100 % 02/10/21 1028  Vitals shown include unvalidated device data.  Last Pain:  Vitals:   02/10/21 0905  TempSrc:   PainSc: 0-No pain         Complications: No notable events documented.

## 2021-02-10 NOTE — Discharge Instructions (Signed)
[facility] _______[facility]  UMBILICAL OR INGUINAL HERNIA REPAIR: POST OP INSTRUCTIONS  Always review your discharge instruction sheet given to you by the facility where your surgery was performed. IF YOU HAVE DISABILITY OR FAMILY LEAVE FORMS, YOU MUST BRING THEM TO THE OFFICE FOR PROCESSING.   DO NOT GIVE THEM TO YOUR DOCTOR.  1. A  prescription for pain medication may be given to you upon discharge.  Take your pain medication as prescribed, if needed.  If narcotic pain medicine is not needed, then you may take acetaminophen (Tylenol) or ibuprofen (Advil) as needed. 2. Take your usually prescribed medications unless otherwise directed. If you need a refill on your pain medication, please contact your pharmacy.  They will contact our office to request authorization. Prescriptions will not be filled after 5 pm or on week-ends. 3. You should follow a light diet the first 24 hours after arrival home, such as soup and crackers, etc.  Be sure to include lots of fluids daily.  Resume your normal diet the day after surgery. 4.Most patients will experience some swelling and bruising around the umbilicus or in the groin and scrotum.  Ice packs and reclining will help.  Swelling and bruising can take several days to resolve.  6. It is common to experience some constipation if taking pain medication after surgery.  Increasing fluid intake and taking a stool softener (such as Colace) will usually help or prevent this problem from occurring.  A mild laxative (Milk of Magnesia or Miralax) should be taken according to package directions if there are no bowel movements after 48 hours. 7. Unless discharge instructions indicate otherwise, you may remove your bandages 24-48 hours after surgery, and you may shower at that time.  You may have steri-strips (small skin tapes) in place directly over the incision.  These strips should be left on the skin for 7-10 days.  If your surgeon used skin glue on the  incision, you may shower in 24 hours.  The glue will flake off over the next 2-3 weeks.  Any sutures or staples will be removed at the office during your follow-up visit. 8. ACTIVITIES:  You may resume regular (light) daily activities beginning the next day--such as daily self-care, walking, climbing stairs--gradually increasing activities as tolerated.  You may have sexual intercourse when it is comfortable.  Refrain from any heavy lifting or straining until approved by your doctor.  a.You may drive when you are no longer taking prescription pain medication, you can comfortably wear a seatbelt, and you can safely maneuver your car and apply brakes. b.RETURN TO WORK:   _____________________________________________  9.You should see your doctor in the office for a follow-up appointment approximately 2-3 weeks after your surgery.  Make sure that you call for this appointment within a day or two after you arrive home to insure a convenient appointment time. 10.OTHER INSTRUCTIONS: __YOU MAY SHOWER STARTING TOMORROW ICE PACK AND TYLENOL ALSO FOR PAIN NO LIFTING MORE THAN 15 POUNDS FOR 4 WEEKS RESUME BLOOD THINNING MEDICATION TOMORROW MORNING_______________________    _____________________________________  WHEN TO CALL YOUR DOCTOR: Fever over 101.0 Inability to urinate Nausea and/or vomiting Extreme swelling or bruising Continued bleeding from incision. Increased pain, redness, or drainage from the incision  The clinic staff is available to answer your questions during regular business hours.  Please don't hesitate to call and ask to speak to one of the nurses for clinical concerns.  If you have a medical emergency, go to the nearest emergency  room or call 911.  A surgeon from [facility] is always on call at the hospital   [address] [place], [place], [place]  [address] ?  [address], [place], [place]   [address] [phone] ? [phone] ? FAX [phone] Web  site: [email or web address]

## 2021-02-10 NOTE — Anesthesia Postprocedure Evaluation (Signed)
Anesthesia Post Note  Patient: Langley Ingalls Bardsley  Procedure(s) Performed: HERNIA REPAIR RIGHT INGUINAL (Right: Inguinal)     Patient location during evaluation: PACU Anesthesia Type: General and Regional Level of consciousness: awake and alert Pain management: pain level controlled Vital Signs Assessment: post-procedure vital signs reviewed and stable Respiratory status: spontaneous breathing, nonlabored ventilation and respiratory function stable Cardiovascular status: blood pressure returned to baseline and stable Postop Assessment: no apparent nausea or vomiting Anesthetic complications: no   No notable events documented.  Last Vitals:  Vitals:   02/10/21 1209 02/10/21 1215  BP: 117/61 112/63  Pulse: (!) 56 (!) 58  Resp: 18   Temp:    SpO2: 96% 95%    Last Pain:  Vitals:   02/10/21 1209  TempSrc:   PainSc: 0-No pain                 Lauralyn Shadowens,W. EDMOND

## 2021-02-12 ENCOUNTER — Encounter (HOSPITAL_COMMUNITY): Payer: Self-pay | Admitting: Surgery

## 2021-03-07 ENCOUNTER — Other Ambulatory Visit: Payer: Self-pay | Admitting: Cardiovascular Disease

## 2021-03-07 DIAGNOSIS — I10 Essential (primary) hypertension: Secondary | ICD-10-CM

## 2021-04-09 ENCOUNTER — Other Ambulatory Visit: Payer: Self-pay | Admitting: Cardiovascular Disease

## 2021-04-11 NOTE — Telephone Encounter (Signed)
Prescription refill request for Eliquis received. Indication:Afib Last office visit:10/22 Scr:1.4 Age: 81 Weight:90 kg  Prescription refilled

## 2021-04-18 DIAGNOSIS — G4733 Obstructive sleep apnea (adult) (pediatric): Secondary | ICD-10-CM | POA: Diagnosis not present

## 2021-04-22 DIAGNOSIS — H6123 Impacted cerumen, bilateral: Secondary | ICD-10-CM | POA: Diagnosis not present

## 2021-06-02 ENCOUNTER — Telehealth: Payer: Self-pay

## 2021-06-02 ENCOUNTER — Other Ambulatory Visit: Payer: Self-pay | Admitting: Cardiovascular Disease

## 2021-06-02 ENCOUNTER — Other Ambulatory Visit: Payer: Self-pay | Admitting: Nurse Practitioner

## 2021-06-02 DIAGNOSIS — I10 Essential (primary) hypertension: Secondary | ICD-10-CM

## 2021-06-02 NOTE — Telephone Encounter (Signed)
Left pt vm, to give the office a call back to schedule an OV.   ?

## 2021-06-08 ENCOUNTER — Ambulatory Visit (INDEPENDENT_AMBULATORY_CARE_PROVIDER_SITE_OTHER): Payer: Medicare Other | Admitting: Nurse Practitioner

## 2021-06-08 ENCOUNTER — Other Ambulatory Visit: Payer: Self-pay

## 2021-06-08 ENCOUNTER — Encounter: Payer: Self-pay | Admitting: Nurse Practitioner

## 2021-06-08 VITALS — BP 124/80 | HR 54 | Temp 98.3°F | Ht 70.0 in | Wt 193.0 lb

## 2021-06-08 DIAGNOSIS — E782 Mixed hyperlipidemia: Secondary | ICD-10-CM | POA: Diagnosis not present

## 2021-06-08 DIAGNOSIS — I10 Essential (primary) hypertension: Secondary | ICD-10-CM

## 2021-06-08 DIAGNOSIS — G4733 Obstructive sleep apnea (adult) (pediatric): Secondary | ICD-10-CM | POA: Diagnosis not present

## 2021-06-08 DIAGNOSIS — Z9989 Dependence on other enabling machines and devices: Secondary | ICD-10-CM

## 2021-06-08 DIAGNOSIS — Z79899 Other long term (current) drug therapy: Secondary | ICD-10-CM

## 2021-06-08 DIAGNOSIS — Z2821 Immunization not carried out because of patient refusal: Secondary | ICD-10-CM

## 2021-06-08 DIAGNOSIS — I480 Paroxysmal atrial fibrillation: Secondary | ICD-10-CM | POA: Diagnosis not present

## 2021-06-08 LAB — CBC
Hematocrit: 41.2 % (ref 37.5–51.0)
Hemoglobin: 14.2 g/dL (ref 13.0–17.7)
MCH: 32.1 pg (ref 26.6–33.0)
MCHC: 34.5 g/dL (ref 31.5–35.7)
MCV: 93 fL (ref 79–97)
Platelets: 220 10*3/uL (ref 150–450)
RBC: 4.43 x10E6/uL (ref 4.14–5.80)
RDW: 12.4 % (ref 11.6–15.4)
WBC: 6.5 10*3/uL (ref 3.4–10.8)

## 2021-06-08 LAB — CMP14+EGFR
ALT: 22 IU/L (ref 0–44)
AST: 21 IU/L (ref 0–40)
Albumin/Globulin Ratio: 2 (ref 1.2–2.2)
Albumin: 4.5 g/dL (ref 3.6–4.6)
Alkaline Phosphatase: 70 IU/L (ref 44–121)
BUN/Creatinine Ratio: 18 (ref 10–24)
BUN: 27 mg/dL (ref 8–27)
Bilirubin Total: 0.8 mg/dL (ref 0.0–1.2)
CO2: 23 mmol/L (ref 20–29)
Calcium: 10.2 mg/dL (ref 8.6–10.2)
Chloride: 104 mmol/L (ref 96–106)
Creatinine, Ser: 1.47 mg/dL — ABNORMAL HIGH (ref 0.76–1.27)
Globulin, Total: 2.3 g/dL (ref 1.5–4.5)
Glucose: 77 mg/dL (ref 70–99)
Potassium: 5.3 mmol/L — ABNORMAL HIGH (ref 3.5–5.2)
Sodium: 141 mmol/L (ref 134–144)
Total Protein: 6.8 g/dL (ref 6.0–8.5)
eGFR: 47 mL/min/{1.73_m2} — ABNORMAL LOW (ref >59)

## 2021-06-08 LAB — LIPID PANEL
Chol/HDL Ratio: 2.9 ratio (ref 0.0–5.0)
Cholesterol, Total: 166 mg/dL (ref 100–199)
HDL: 58 mg/dL (ref >39)
LDL Chol Calc (NIH): 90 mg/dL (ref 0–99)
Triglycerides: 98 mg/dL (ref 0–149)
VLDL Cholesterol Cal: 18 mg/dL (ref 5–40)

## 2021-06-08 NOTE — Patient Instructions (Signed)

## 2021-06-08 NOTE — Progress Notes (Unsigned)
Rich Brave Llittleton,acting as a Education administrator for [name], FNP.,have documented all relevant documentation on the behalf of [name], FNP,as directed by  [name], FNP while in the presence of [name], [place].   This visit occurred during the SARS-CoV-2 public health emergency.  Safety protocols were in place, including screening questions prior to the visit, additional usage of staff PPE, and extensive cleaning of exam room while observing appropriate contact time as indicated for disinfecting solutions.  Subjective:     Patient ID: Mario Palmer , male    DOB: 12-09-1940 , 81 y.o.   MRN: [id number]   Chief Complaint  Patient presents with   Hypertension    HPI  Patient presents today for a bp check. He reports compliance with his medications. He does not have any questions or concerns at this time.  Continues to be followed by Dr. Claiborne Billings. He has had another hernia surgery, reports was difficult. Exercising daily 2-2.5 miles a day.  He is now wearing hearing aids bilateral  Wt Readings from Last 3 Encounters: 06/08/21 : 193 lb (87.5 kg) 02/10/21 : 198 lb 6.6 oz (90 kg) 02/03/21 : 198 lb 5 oz (90 kg)    Hypertension This is a chronic problem. The current episode started more than 1 year ago. The problem is unchanged. The problem is controlled. Pertinent negatives include no anxiety. There are no associated agents to hypertension. There are no known risk factors for coronary artery disease.    Past Medical History:  Diagnosis Date   Arthritis    Cancer ([place])    skin cancer   Dysrhythmia    afib   GERD (gastroesophageal reflux disease)    High cholesterol    History of kidney stones    Hx of echocardiogram 12/2010   EF 55%, There is Stage 1 diastolic dyfunction. Normal LV filling pressure. Aortic sclerosis with mild AI, mild TR   Hypertension    Palpitation    with ecotopy   Sleep apnea      No family history on file.   Current Outpatient Medications:     amiodarone (PACERONE) 100 MG tablet, Take 1 tablet (100 mg total) by mouth daily., Disp: 90 tablet, Rfl: 3   apixaban (ELIQUIS) 2.5 MG TABS tablet, TAKE ONE TABLET BY MOUTH TWICE DAILY, Disp: 180 tablet, Rfl: 1   atorvastatin (LIPITOR) 20 MG tablet, TAKE ONE TABLET BY MOUTH ONE TIME DAILY, Disp: 90 tablet, Rfl: 3   Cholecalciferol (VITAMIN D) 2000 UNITS tablet, Take 2,000 Units by mouth daily., Disp: , Rfl:    diltiazem (CARDIZEM CD) 120 MG 24 hr capsule, TAKE ONE CAPSULE BY MOUTH DAILY AT BEDTIME, Disp: 90 capsule, Rfl: 1   lisinopril (ZESTRIL) 40 MG tablet, Take 1 tablet (40 mg total) by mouth daily., Disp: 90 tablet, Rfl: 2   Multiple Vitamins-Minerals (MULTIVITAMIN ADULT PO), Take 1 tablet by mouth daily., Disp: , Rfl:    nebivolol (BYSTOLIC) 10 MG tablet, Take 0.5 tablets (5 mg total) by mouth daily., Disp: 90 tablet, Rfl: 1   NON FORMULARY, CPAP therapy, Disp: , Rfl:    Saccharomyces boulardii (PROBIOTIC) 250 MG CAPS, Take 1 capsule by mouth daily., Disp: , Rfl:    sodium chloride (OCEAN) 0.65 % SOLN nasal spray, Place 1 spray into both nostrils as needed for congestion., Disp: , Rfl:    spironolactone (ALDACTONE) 25 MG tablet, TAKE ONE TABLET BY MOUTH ONE TIME DAILY (Patient taking differently: Take 12.5 mg by mouth daily.), Disp: 90  tablet, Rfl: 1   tamsulosin (FLOMAX) 0.4 MG CAPS capsule, Take 0.4 mg by mouth daily., Disp: , Rfl:    traMADol (ULTRAM) 50 MG tablet, Take 1 tablet (50 mg total) by mouth every 6 (six) hours as needed for moderate pain or severe pain., Disp: 20 tablet, Rfl: 0   No Known Allergies   Review of Systems  Constitutional: Negative.   HENT:  Positive for hearing loss.   Eyes: Negative.   Respiratory: Negative.    Cardiovascular: Negative.   Musculoskeletal: Negative.   Skin: Negative.   Psychiatric/Behavioral: Negative.      Today's Vitals   06/08/21 1008  BP: 124/80  Pulse: (!) 54  Temp: 98.3 F (36.8 C)  Weight: 193 lb (87.5 kg)  Height: '5\' 10"'$   (1.778 m)  PainSc: 0-No pain   Body mass index is 27.69 kg/m.  Wt Readings from Last 3 Encounters:  06/08/21 193 lb (87.5 kg)  02/10/21 198 lb 6.6 oz (90 kg)  02/03/21 198 lb 5 oz (90 kg)     Objective:  Physical Exam Vitals reviewed.  Constitutional:      General: He is not in acute distress.    Appearance: Normal appearance.  Cardiovascular:     Rate and Rhythm: Normal rate and regular rhythm.     Pulses: Normal pulses.     Heart sounds: Normal heart sounds. No murmur heard. Pulmonary:     Effort: No respiratory distress.     Breath sounds: Normal breath sounds.  Skin:    General: Skin is warm and dry.     Capillary Refill: Capillary refill takes less than 2 seconds.     Comments: Skin has multiple areas of superficial bruising (elderly)  Neurological:     General: No focal deficit present.     Mental Status: He is alert and oriented to person, place, and time.  Psychiatric:        Mood and Affect: Mood normal.        Behavior: Behavior normal.        Thought Content: Thought content normal.        Judgment: Judgment normal.        Assessment And Plan:     1. Essential hypertension  2. Tetanus, diphtheria, and acellular pertussis (Tdap) vaccination declined  3. Influenza vaccination declined  4. Pneumococcal vaccination declined  5. Herpes zoster vaccination declined     Patient was given opportunity to ask questions. Patient verbalized understanding of the plan and was able to repeat key elements of the plan. All questions were answered to their satisfaction.  Sheppard Evens Llittleton, CMA   I, [place], CMA, have reviewed all documentation for this visit. The documentation on 06/08/21 for the exam, diagnosis, procedures, and orders are all accurate and complete.   IF YOU HAVE BEEN REFERRED TO A SPECIALIST, IT MAY TAKE 1-2 WEEKS TO SCHEDULE/PROCESS THE REFERRAL. IF YOU HAVE NOT HEARD FROM US/SPECIALIST IN TWO WEEKS, PLEASE GIVE [place] A CALL AT  [phone].   THE PATIENT IS ENCOURAGED TO PRACTICE SOCIAL DISTANCING DUE TO THE COVID-19 PANDEMIC.

## 2021-06-14 ENCOUNTER — Telehealth: Payer: Self-pay | Admitting: Nurse Practitioner

## 2021-06-14 NOTE — Telephone Encounter (Signed)
Left message for patient to call back and schedule Medicare Annual Wellness Visit (AWV) either virtually or in office.  Left both my jabber number [phone] and office number  ? ? ?Last AWV ;08/06/19 ?please schedule at anytime with [facility]  ? ? ?This should be a 45 minute visit.  ?

## 2021-06-22 ENCOUNTER — Ambulatory Visit (INDEPENDENT_AMBULATORY_CARE_PROVIDER_SITE_OTHER): Payer: Medicare Other

## 2021-06-22 ENCOUNTER — Other Ambulatory Visit: Payer: Self-pay

## 2021-06-22 VITALS — BP 110/60 | HR 58 | Temp 97.8°F | Ht 67.0 in | Wt 195.4 lb

## 2021-06-22 DIAGNOSIS — Z0000 Encounter for general adult medical examination without abnormal findings: Secondary | ICD-10-CM

## 2021-06-22 NOTE — Patient Instructions (Signed)
Mario Palmer , ?Thank you for taking time to come for your Medicare Wellness Visit. I appreciate your ongoing commitment to your health goals. Please review the following plan we discussed and let me know if I can assist you in the future.  ? ?Screening recommendations/referrals: ?Colonoscopy: not required ?Recommended yearly ophthalmology/optometry visit for glaucoma screening and checkup ?Recommended yearly dental visit for hygiene and checkup ? ?Vaccinations: ?Influenza vaccine: decline ?Pneumococcal vaccine: decline ?Tdap vaccine: due ?Shingles vaccine: decline   ?Covid-19:  07/07/2019, 06/16/2019 ? ?Advanced directives: Please bring a copy of your POA (Power of Attorney) and/or Living Will to your next appointment.  ? ? ?Conditions/risks identified: none ? ?Next appointment: Follow up in one year for your annual wellness visit.  ? ?Preventive Care 83 Years and Older, Male ?Preventive care refers to lifestyle choices and visits with your health care provider that can promote health and wellness. ?What does preventive care include? ?A yearly physical exam. This is also called an annual well check. ?Dental exams once or twice a year. ?Routine eye exams. Ask your health care provider how often you should have your eyes checked. ?Personal lifestyle choices, including: ?Daily care of your teeth and gums. ?Regular physical activity. ?Eating a healthy diet. ?Avoiding tobacco and drug use. ?Limiting alcohol use. ?Practicing safe sex. ?Taking low doses of aspirin every day. ?Taking vitamin and mineral supplements as recommended by your health care provider. ?What happens during an annual well check? ?The services and screenings done by your health care provider during your annual well check will depend on your age, overall health, lifestyle risk factors, and family history of disease. ?Counseling  ?Your health care provider may ask you questions about your: ?Alcohol use. ?Tobacco use. ?Drug use. ?Emotional well-being. ?Home  and relationship well-being. ?Sexual activity. ?Eating habits. ?History of falls. ?Memory and ability to understand (cognition). ?Work and work Statistician. ?Screening  ?You may have the following tests or measurements: ?Height, weight, and BMI. ?Blood pressure. ?Lipid and cholesterol levels. These may be checked every 5 years, or more frequently if you are over 83 years old. ?Skin check. ?Lung cancer screening. You may have this screening every year starting at age 83 if you have a 30-pack-year history of smoking and currently smoke or have quit within the past 15 years. ?Fecal occult blood test (FOBT) of the stool. You may have this test every year starting at age 83. ?Flexible sigmoidoscopy or colonoscopy. You may have a sigmoidoscopy every 5 years or a colonoscopy every 10 years starting at age 83. ?Prostate cancer screening. Recommendations will vary depending on your family history and other risks. ?Hepatitis C blood test. ?Hepatitis B blood test. ?Sexually transmitted disease (STD) testing. ?Diabetes screening. This is done by checking your blood sugar (glucose) after you have not eaten for a while (fasting). You may have this done every 1-3 years. ?Abdominal aortic aneurysm (AAA) screening. You may need this if you are a current or former smoker. ?Osteoporosis. You may be screened starting at age 83 if you are at high risk. ?Talk with your health care provider about your test results, treatment options, and if necessary, the need for more tests. ?Vaccines  ?Your health care provider may recommend certain vaccines, such as: ?Influenza vaccine. This is recommended every year. ?Tetanus, diphtheria, and acellular pertussis (Tdap, Td) vaccine. You may need a Td booster every 10 years. ?Zoster vaccine. You may need this after age 83. ?Pneumococcal 13-valent conjugate (PCV13) vaccine. One dose is recommended after age 83. ?Pneumococcal polysaccharide (  PPSV23) vaccine. One dose is recommended after age 83. ?Talk to  your health care provider about which screenings and vaccines you need and how often you need them. ?This information is not intended to replace advice given to you by your health care provider. Make sure you discuss any questions you have with your health care provider. ?Document Released: 04/16/2015 Document Revised: 12/08/2015 Document Reviewed: 01/19/2015 ?Elsevier Interactive Patient Education ? 2017 [place]. ? ?Fall Prevention in the Home ?Falls can cause injuries. They can happen to people of all ages. There are many things you can do to make your home safe and to help prevent falls. ?What can I do on the outside of my home? ?Regularly fix the edges of walkways and driveways and fix any cracks. ?Remove anything that might make you trip as you walk through a door, such as a raised step or threshold. ?Trim any bushes or trees on the path to your home. ?Use bright outdoor lighting. ?Clear any walking paths of anything that might make someone trip, such as rocks or tools. ?Regularly check to see if handrails are loose or broken. Make sure that both sides of any steps have handrails. ?Any raised decks and porches should have guardrails on the edges. ?Have any leaves, snow, or ice cleared regularly. ?Use sand or salt on walking paths during winter. ?Clean up any spills in your garage right away. This includes oil or grease spills. ?What can I do in the bathroom? ?Use night lights. ?Install grab bars by the toilet and in the tub and shower. Do not use towel bars as grab bars. ?Use non-skid mats or decals in the tub or shower. ?If you need to sit down in the shower, use a plastic, non-slip stool. ?Keep the floor dry. Clean up any water that spills on the floor as soon as it happens. ?Remove soap buildup in the tub or shower regularly. ?Attach bath mats securely with double-sided non-slip rug tape. ?Do not have throw rugs and other things on the floor that can make you trip. ?What can I do in the bedroom? ?Use  night lights. ?Make sure that you have a light by your bed that is easy to reach. ?Do not use any sheets or blankets that are too big for your bed. They should not hang down onto the floor. ?Have a firm chair that has side arms. You can use this for support while you get dressed. ?Do not have throw rugs and other things on the floor that can make you trip. ?What can I do in the kitchen? ?Clean up any spills right away. ?Avoid walking on wet floors. ?Keep items that you use a lot in easy-to-reach places. ?If you need to reach something above you, use a strong step stool that has a grab bar. ?Keep electrical cords out of the way. ?Do not use floor polish or wax that makes floors slippery. If you must use wax, use non-skid floor wax. ?Do not have throw rugs and other things on the floor that can make you trip. ?What can I do with my stairs? ?Do not leave any items on the stairs. ?Make sure that there are handrails on both sides of the stairs and use them. Fix handrails that are broken or loose. Make sure that handrails are as long as the stairways. ?Check any carpeting to make sure that it is firmly attached to the stairs. Fix any carpet that is loose or worn. ?Avoid having throw rugs at the top or  bottom of the stairs. If you do have throw rugs, attach them to the floor with carpet tape. ?Make sure that you have a light switch at the top of the stairs and the bottom of the stairs. If you do not have them, ask someone to add them for you. ?What else can I do to help prevent falls? ?Wear shoes that: ?Do not have high heels. ?Have rubber bottoms. ?Are comfortable and fit you well. ?Are closed at the toe. Do not wear sandals. ?If you use a stepladder: ?Make sure that it is fully opened. Do not climb a closed stepladder. ?Make sure that both sides of the stepladder are locked into place. ?Ask someone to hold it for you, if possible. ?Clearly mark and make sure that you can see: ?Any grab bars or handrails. ?First and last  steps. ?Where the edge of each step is. ?Use tools that help you move around (mobility aids) if they are needed. These include: ?Canes. ?Walkers. ?Scooters. ?Crutches. ?Turn on the lights when you go i

## 2021-06-22 NOTE — Progress Notes (Signed)
?This visit occurred during the SARS-CoV-2 public health emergency.  Safety protocols were in place, including screening questions prior to the visit, additional usage of staff PPE, and extensive cleaning of exam room while observing appropriate contact time as indicated for disinfecting solutions. ? ?Subjective:  ? Mario Palmer is a 81 y.o. male who presents for Medicare Annual/Subsequent preventive examination. ? ?Review of Systems    ? ?Cardiac Risk Factors include: advanced age (>107mn, >>63women);dyslipidemia;hypertension;male gender ? ?   ?Objective:  ?  ?Today's Vitals  ? 06/22/21 1558  ?BP: 110/60  ?Pulse: (!) 58  ?Temp: 97.8 ?F (36.6 ?C)  ?TempSrc: Oral  ?SpO2: 97%  ?Weight: 195 lb 6.4 oz (88.6 kg)  ?Height: '5\' 7"'$  (1.702 m)  ? ?Body mass index is 30.6 kg/m?. ? ? ?  06/22/2021  ?  4:07 PM 02/03/2021  ? 10:32 AM 08/06/2019  ?  9:16 AM 12/07/2018  ? 10:42 AM 12/03/2018  ? 10:29 AM 11/28/2018  ?  7:53 AM 11/21/2018  ?  3:30 PM  ?Advanced Directives  ?Does Patient Have a Medical Advance Directive? Yes Yes Yes  Yes Yes Yes  ?Type of AParamedicof AFairviewLiving will HAlmediaLiving will HCrescent SpringsLiving will HBradleyLiving will HMedfordLiving will HLos BerrosLiving will Healthcare Power of Attorney  ?Copy of HHatchin Chart? No - copy requested No - copy requested No - copy requested  No - copy requested, Physician notified No - copy requested No - copy requested  ? ? ?Current Medications (verified) ?Outpatient Encounter Medications as of 06/22/2021  ?Medication Sig  ? amiodarone (PACERONE) 100 MG tablet Take 1 tablet (100 mg total) by mouth daily.  ? apixaban (ELIQUIS) 2.5 MG TABS tablet TAKE ONE TABLET BY MOUTH TWICE DAILY  ? atorvastatin (LIPITOR) 20 MG tablet TAKE ONE TABLET BY MOUTH ONE TIME DAILY  ? Cholecalciferol (VITAMIN D) 2000 UNITS tablet Take 2,000 Units by  mouth daily.  ? diltiazem (CARDIZEM CD) 120 MG 24 hr capsule TAKE ONE CAPSULE BY MOUTH DAILY AT BEDTIME  ? lisinopril (ZESTRIL) 40 MG tablet Take 1 tablet (40 mg total) by mouth daily.  ? Multiple Vitamins-Minerals (MULTIVITAMIN ADULT PO) Take 1 tablet by mouth daily.  ? nebivolol (BYSTOLIC) 10 MG tablet Take 0.5 tablets (5 mg total) by mouth daily.  ? NON FORMULARY CPAP therapy  ? Saccharomyces boulardii (PROBIOTIC) 250 MG CAPS Take 1 capsule by mouth daily.  ? sodium chloride (OCEAN) 0.65 % SOLN nasal spray Place 1 spray into both nostrils as needed for congestion.  ? spironolactone (ALDACTONE) 25 MG tablet TAKE ONE TABLET BY MOUTH ONE TIME DAILY (Patient taking differently: Take 12.5 mg by mouth daily.)  ? tamsulosin (FLOMAX) 0.4 MG CAPS capsule Take 0.4 mg by mouth daily.  ? traMADol (ULTRAM) 50 MG tablet Take 1 tablet (50 mg total) by mouth every 6 (six) hours as needed for moderate pain or severe pain. (Patient not taking: Reported on 06/22/2021)  ? ?No facility-administered encounter medications on file as of 06/22/2021.  ? ? ?Allergies (verified) ?Patient has no known allergies.  ? ?History: ?Past Medical History:  ?Diagnosis Date  ? Arthritis   ? Cancer ([facility]   ? skin cancer  ? Dysrhythmia   ? afib  ? GERD (gastroesophageal reflux disease)   ? High cholesterol   ? History of kidney stones   ? Hx of echocardiogram 12/2010  ? EF 55%,  There is Stage 1 diastolic dyfunction. Normal LV filling pressure. Aortic sclerosis with mild AI, mild TR  ? Hypertension   ? Palpitation   ? with ecotopy  ? Sleep apnea   ? ?Past Surgical History:  ?Procedure Laterality Date  ? CARDIOVERSION N/A 09/12/2016  ? Procedure: CARDIOVERSION;  Surgeon: Troy Sine, MD;  Location[facility] ENDOSCOPY;  Service: Cardiovascular;  Laterality: N/A;  ? EYE SURGERY Bilateral   ? cataract  ? INGUINAL HERNIA REPAIR Left 11/28/2018  ? Procedure: LAPAROSCOPIC  LEFT INGUINAL HERNIA WITH MESH;  Surgeon: Coralie Keens, MD;  Location: [place];  Service:  General;  Laterality: Left;  ? INGUINAL HERNIA REPAIR Right 02/10/2021  ? Procedure: HERNIA REPAIR RIGHT INGUINAL;  Surgeon: Coralie Keens, MD;  Location: [facility] ORS;  Service: General;  Laterality: Right;  ? MOHS SURGERY Left   ? skin cancer remover from left side  ? RETINAL DETACHMENT SURGERY Right   ? RETINAL LASER PROCEDURE Right 04/2018  ? SKIN CANCER EXCISION  07/2018  ? TONSILLECTOMY    ? UMBILICAL HERNIA REPAIR N/A 11/28/2018  ? Procedure: OPEN UMBILICAL HERNIA REPAIR WITH MESH;  Surgeon: Coralie Keens, MD;  Location: [place];  Service: General;  Laterality: N/A;  ? ?History reviewed. No pertinent family history. ?Social History  ? ?Socioeconomic History  ? Marital status: Married  ?  Spouse name: Not on file  ? Number of children: Not on file  ? Years of education: Not on file  ? Highest education level: Not on file  ?Occupational History  ? Occupation: retired  ?Tobacco Use  ? Smoking status: Former  ?  Types: Cigarettes  ? Smokeless tobacco: Never  ? Tobacco comments:  ?  quit smoking in 1982  ?Vaping Use  ? Vaping Use: Never used  ?Substance and Sexual Activity  ? Alcohol use: No  ? Drug use: No  ? Sexual activity: Yes  ?Other Topics Concern  ? Not on file  ?Social History Narrative  ? Not on file  ? ?Social Determinants of Health  ? ?Financial Resource Strain: Low Risk   ? Difficulty of Paying Living Expenses: Not hard at all  ?Food Insecurity: No Food Insecurity  ? Worried About Charity fundraiser in the Last Year: Never true  ? Ran Out of Food in the Last Year: Never true  ?Transportation Needs: No Transportation Needs  ? Lack of Transportation (Medical): No  ? Lack of Transportation (Non-Medical): No  ?Physical Activity: Sufficiently Active  ? Days of Exercise per Week: 6 days  ? Minutes of Exercise per Session: 90 min  ?Stress: No Stress Concern Present  ? Feeling of Stress : Not at all  ?Social Connections: Not on file  ? ? ?Tobacco Counseling ?Counseling given: Not Answered ?Tobacco comments:  quit smoking in 1982 ? ? ?Clinical Intake: ? ?Pre-visit preparation completed: Yes ? ?Pain : No/denies pain ? ?  ? ?Nutritional Status: BMI > 30  Obese ?Nutritional Risks: None ?Diabetes: No ? ?How often do you need to have someone help you when you read instructions, pamphlets, or other written materials from your doctor or pharmacy?: 1 - Never ?What is the last grade level you completed in school?: 12th grade ? ?Diabetic? no ? ?Interpreter Needed?: No ? ?Information entered by :: NAllen LPN ? ? ?Activities of Daily Living ? ?  06/22/2021  ?  4:08 PM 02/03/2021  ? 10:33 AM  ?In your present state of health, do you have any difficulty performing the following  activities:  ?Hearing? 1   ?Comment hearing aides   ?Vision? 0   ?Difficulty concentrating or making decisions? 0   ?Walking or climbing stairs? 0   ?Dressing or bathing? 0   ?Doing errands, shopping? 0 0  ?Preparing Food and eating ? N   ?Using the Toilet? N   ?In the past six months, have you accidently leaked urine? N   ?Do you have problems with loss of bowel control? N   ?Managing your Medications? N   ?Managing your Finances? N   ?Housekeeping or managing your Housekeeping? N   ? ? ?Patient Care Team: ?[name], FNP as PCP - General (General Practice) ?Troy Sine, MD as PCP - Cardiology (Cardiology) ?Clent Jacks, MD as Consulting Physician (Ophthalmology) ?Rankin, Clent Demark, MD as Consulting Physician (Ophthalmology) ? ?Indicate any recent Medical Services you may have received from other than [facility] providers in the past year (date may be approximate). ? ?   ?Assessment:  ? This is a routine wellness examination for Zayne. ? ?Hearing/Vision screen ?Vision Screening - Comments:: Regular eye exams, Dr. Zadie Rhine ? ?Dietary issues and exercise activities discussed: ?Current Exercise Habits: Home exercise routine, Type of exercise: walking;Other - see comments (stationary bike), Time (Minutes): > 60, Frequency (Times/Week): 6, Weekly Exercise  (Minutes/Week): 0 ? ? Goals Addressed   ? ?  ?  ?  ?  ? This Visit's Progress  ?  Patient Stated     ?  06/22/2021, wants to lose weight ?  ? ?  ? ?Depression Screen ? ?  06/22/2021  ?  4:08 PM 06/08/2021  ? 10:05 AM 5/5/2

## 2021-06-23 ENCOUNTER — Ambulatory Visit: Payer: Medicare Other

## 2021-07-14 ENCOUNTER — Other Ambulatory Visit: Payer: Self-pay | Admitting: Cardiovascular Disease

## 2021-07-17 DIAGNOSIS — G4733 Obstructive sleep apnea (adult) (pediatric): Secondary | ICD-10-CM | POA: Diagnosis not present

## 2021-07-25 ENCOUNTER — Other Ambulatory Visit: Payer: Self-pay | Admitting: Cardiovascular Disease

## 2021-09-13 ENCOUNTER — Other Ambulatory Visit: Payer: Self-pay | Admitting: Cardiovascular Disease

## 2021-09-13 NOTE — Telephone Encounter (Signed)
Discussed with York Pellant D. Will keep pt on the current dose because pt Scr is close to 1.5

## 2021-09-13 NOTE — Telephone Encounter (Addendum)
Prescription refill request for Eliquis received.  Indication: afib  Last office visit: [place], 01/26/2021 Scr: 1.47, 06/08/2021 Age: 81 yo  Weight: 88.6 kg   Per dosing criteria. Pt qualifies for a dose change.

## 2021-09-15 ENCOUNTER — Encounter (INDEPENDENT_AMBULATORY_CARE_PROVIDER_SITE_OTHER): Payer: Self-pay | Admitting: Ophthalmology

## 2021-09-15 ENCOUNTER — Ambulatory Visit (INDEPENDENT_AMBULATORY_CARE_PROVIDER_SITE_OTHER): Payer: Medicare Other | Admitting: Ophthalmology

## 2021-09-15 DIAGNOSIS — Z9989 Dependence on other enabling machines and devices: Secondary | ICD-10-CM

## 2021-09-15 DIAGNOSIS — H353112 Nonexudative age-related macular degeneration, right eye, intermediate dry stage: Secondary | ICD-10-CM | POA: Diagnosis not present

## 2021-09-15 DIAGNOSIS — H35371 Puckering of macula, right eye: Secondary | ICD-10-CM | POA: Diagnosis not present

## 2021-09-15 DIAGNOSIS — G4733 Obstructive sleep apnea (adult) (pediatric): Secondary | ICD-10-CM | POA: Diagnosis not present

## 2021-09-15 DIAGNOSIS — H35372 Puckering of macula, left eye: Secondary | ICD-10-CM

## 2021-09-15 NOTE — Assessment & Plan Note (Signed)
Minor nondistorted and observe

## 2021-09-15 NOTE — Patient Instructions (Signed)

## 2021-09-15 NOTE — Assessment & Plan Note (Signed)
Compliant with CPAP, continue to minimize macular hypoxia

## 2021-09-15 NOTE — Progress Notes (Signed)
09/15/2021     CHIEF COMPLAINT Patient presents for  Chief Complaint  Patient presents with   Retina Follow Up      HISTORY OF PRESENT ILLNESS: Mario Palmer is a 81 y.o. male who presents to the clinic today for:   HPI     Retina Follow Up           Diagnosis: ERM    Laterality: right eye   Onset: 1 year ago         Comments   1 yr fu OU OCT. Patient states vision is stable and unchanged since last visit. Denies any new floaters or FOL. Patient reports he was last seen by Dr. Elliot Dally 2-3 years ago.      Last edited by Laurin Coder on 09/15/2021 10:30 AM.      Referring physician: Clent Jacks, MD [address] [place],  [place] [address]  HISTORICAL INFORMATION:   Selected notes from the MEDICAL RECORD NUMBER    Lab Results  Component Value Date   HGBA1C 5.6 08/25/2020     CURRENT MEDICATIONS: No current outpatient medications on file. (Ophthalmic Drugs)   No current facility-administered medications for this visit. (Ophthalmic Drugs)   Current Outpatient Medications (Other)  Medication Sig   amiodarone (PACERONE) 100 MG tablet Take 1 tablet (100 mg total) by mouth daily.   atorvastatin (LIPITOR) 20 MG tablet TAKE ONE TABLET BY MOUTH ONE TIME DAILY   CARTIA XT 120 MG 24 hr capsule TAKE ONE CAPSULE BY MOUTH DAILY AT BEDTIME   Cholecalciferol (VITAMIN D) 2000 UNITS tablet Take 2,000 Units by mouth daily.   ELIQUIS 2.5 MG TABS tablet TAKE ONE TABLET BY MOUTH TWICE DAILY   lisinopril (ZESTRIL) 40 MG tablet Take 1 tablet (40 mg total) by mouth daily.   Multiple Vitamins-Minerals (MULTIVITAMIN ADULT PO) Take 1 tablet by mouth daily.   nebivolol (BYSTOLIC) 10 MG tablet Take 0.5 tablets (5 mg total) by mouth daily.   NON FORMULARY CPAP therapy   Saccharomyces boulardii (PROBIOTIC) 250 MG CAPS Take 1 capsule by mouth daily.   sodium chloride (OCEAN) 0.65 % SOLN nasal spray Place 1 spray into both nostrils as needed for congestion.    spironolactone (ALDACTONE) 25 MG tablet TAKE ONE TABLET BY MOUTH ONE TIME DAILY   tamsulosin (FLOMAX) 0.4 MG CAPS capsule Take 0.4 mg by mouth daily.   traMADol (ULTRAM) 50 MG tablet Take 1 tablet (50 mg total) by mouth every 6 (six) hours as needed for moderate pain or severe pain. (Patient not taking: Reported on 06/22/2021)   No current facility-administered medications for this visit. (Other)      REVIEW OF SYSTEMS: ROS   Negative for: Constitutional, Gastrointestinal, Neurological, Skin, Genitourinary, Musculoskeletal, HENT, Endocrine, Cardiovascular, Eyes, Respiratory, Psychiatric, Allergic/Imm, Heme/Lymph Last edited by Hurman Horn, MD on 09/15/2021 10:46 AM.       ALLERGIES No Known Allergies  PAST MEDICAL HISTORY Past Medical History:  Diagnosis Date   Arthritis    Cancer ([place])    skin cancer   Dysrhythmia    afib   GERD (gastroesophageal reflux disease)    High cholesterol    History of kidney stones    Hx of echocardiogram 12/2010   EF 55%, There is Stage 1 diastolic dyfunction. Normal LV filling pressure. Aortic sclerosis with mild AI, mild TR   Hypertension    Palpitation    with ecotopy   Sleep apnea    Past  Surgical History:  Procedure Laterality Date   CARDIOVERSION N/A 09/12/2016   Procedure: CARDIOVERSION;  Surgeon: Troy Sine, MD;  Location: [facility] ENDOSCOPY;  Service: Cardiovascular;  Laterality: N/A;   EYE SURGERY Bilateral    cataract   INGUINAL HERNIA REPAIR Left 11/28/2018   Procedure: LAPAROSCOPIC  LEFT INGUINAL HERNIA WITH MESH;  Surgeon: Coralie Keens, MD;  Location: [place];  Service: General;  Laterality: Left;   INGUINAL HERNIA REPAIR Right 02/10/2021   Procedure: HERNIA REPAIR RIGHT INGUINAL;  Surgeon: Coralie Keens, MD;  Location: [facility];  Service: General;  Laterality: Right;   MOHS SURGERY Left    skin cancer remover from left side   RETINAL DETACHMENT SURGERY Right    RETINAL LASER PROCEDURE Right 04/2018   SKIN CANCER  EXCISION  07/2018   TONSILLECTOMY     UMBILICAL HERNIA REPAIR N/A 11/28/2018   Procedure: OPEN UMBILICAL HERNIA REPAIR WITH MESH;  Surgeon: Coralie Keens, MD;  Location: [place];  Service: General;  Laterality: N/A;    FAMILY HISTORY History reviewed. No pertinent family history.  SOCIAL HISTORY Social History   Tobacco Use   Smoking status: Former    Types: Cigarettes   Smokeless tobacco: Never   Tobacco comments:    quit smoking in 1982  Vaping Use   Vaping Use: Never used  Substance Use Topics   Alcohol use: No   Drug use: No         OPHTHALMIC EXAM:  Base Eye Exam     Visual Acuity (ETDRS)       Right Left   Dist [place] 20/25 -2 20/20         Tonometry (Tonopen, 10:31 AM)       Right Left   Pressure 19 16         Pupils       Pupils Dark Light Shape React APD   Right PERRL 4 3 Round Brisk None   Left PERRL 4 3 Round Brisk None         Visual Fields (Counting fingers)       Left Right    Full Full         Extraocular Movement       Right Left    Full Full         Neuro/Psych     Oriented x3: Yes   Mood/Affect: Normal         Dilation     Both eyes: 1.0% Mydriacyl, 2.5% Phenylephrine @ 10:31 AM           Slit Lamp and Fundus Exam     External Exam       Right Left   External Normal Normal         Slit Lamp Exam       Right Left   Lids/Lashes Normal Normal   Conjunctiva/Sclera White and quiet White and quiet   Cornea Clear Clear   Anterior Chamber Deep and quiet Deep and quiet   Iris Round and reactive Round and reactive   Lens Posterior chamber intraocular lens Posterior chamber intraocular lens   Anterior Vitreous Normal Normal         Fundus Exam       Right Left   Posterior Vitreous Vitrectomized Posterior vitreous detachment   Disc Normal Normal   C/D Ratio 0.25 0.2   Macula Normal Normal   Vessels Normal Normal   Periphery Normal Normal  IMAGING AND PROCEDURES  Imaging and  Procedures for 09/15/21  OCT, Retina - OU - Both Eyes       Right Eye Quality was good. Scan locations included subfoveal. Central Foveal Thickness: 435. Progression has improved. Findings include no IRF, no SRF, retinal drusen .   Left Eye Quality was good. Scan locations included subfoveal. Central Foveal Thickness: 349. Progression has been stable. Findings include abnormal foveal contour, retinal drusen , epiretinal membrane.   Notes OD, now 3.5 years status post vitrectomy membrane peel for epiretinal membrane, with improved anatomy.  Much less thickening OD.  OS technically ERM present nasally, not distorting will observe OS             ASSESSMENT/PLAN:  Epiretinal membrane, left eye Minor nondistorted and observe  Intermediate stage nonexudative age-related macular degeneration of right eye The nature of age--related macular degeneration was discussed with the patient as well as the distinction between dry and wet types. Checking an Amsler Grid daily with advice to return immediately should a distortion develop, was given to the patient. The patient 's smoking status now and in the past was determined and advice based on the AREDS study was provided regarding the consumption of antioxidant supplements. AREDS 2 vitamin formulation was recommended. Consumption of dark leafy vegetables and fresh fruits of various colors was recommended. Treatment modalities for wet macular degeneration particularly the use of intravitreal injections of anti-blood vessel growth factors was discussed with the patient. Avastin, Lucentis, and Eylea are the available options. On occasion, therapy includes the use of photodynamic therapy and thermal laser. Stressed to the patient do not rub eyes.  Patient was advised to check Amsler Grid daily and return immediately if changes are noted. Instructions on using the grid were given to the patient. All patient questions were answered.Subfoveal reason, no  change over time no sign of CNVM  OSA on CPAP Compliant with CPAP, continue to minimize macular hypoxia     ICD-10-CM   1. Right epiretinal membrane  H35.371 OCT, Retina - OU - Both Eyes    2. Epiretinal membrane, left eye  H35.372     3. Intermediate stage nonexudative age-related macular degeneration of right eye  H35.3112     4. OSA on CPAP  G47.33    Z99.89       1.  OD looks great with continual macular thickening improving post vitrectomy membrane peel from 2019.  Subfoveal drusenoid appearance however limits acuity of intermediate AMD.  2.  OS with nondistorted epiretinal membrane continue to observe  3.  Patient to use AREDS 2 formulation  Ophthalmic Meds Ordered this visit:  No orders of the defined types were placed in this encounter.      No follow-ups on file.  Patient Instructions  The nature of age--related macular degeneration was discussed with the patient as well as the distinction between dry and wet types. Checking an Amsler Grid daily with advice to return immediately should a distortion develop, was given to the patient. The patient 's smoking status now and in the past was determined and advice based on the AREDS study was provided regarding the consumption of antioxidant supplements. AREDS 2 vitamin formulation was recommended. Consumption of dark leafy vegetables and fresh fruits of various colors was recommended. Treatment modalities for wet macular degeneration particularly the use of intravitreal injections of anti-blood vessel growth factors was discussed with the patient. Avastin, Lucentis, and Eylea are the available options. On occasion, therapy includes the use of photodynamic  therapy and thermal laser. Stressed to the patient do not rub eyes.  Patient was advised to check Amsler Grid daily and return immediately if changes are noted. Instructions on using the grid were given to the patient. All patient questions were answered.    Explained the  diagnoses, plan, and follow up with the patient and they expressed understanding.  Patient expressed understanding of the importance of proper follow up care.   Clent Demark Ziah Leandro M.D. Diseases & Surgery of the Retina and Vitreous Retina & Diabetic [place] 09/15/21     Abbreviations: M myopia (nearsighted); A astigmatism; H hyperopia (farsighted); P presbyopia; Mrx spectacle prescription;  CTL contact lenses; OD right eye; OS left eye; OU both eyes  XT exotropia; ET esotropia; PEK punctate epithelial keratitis; PEE punctate epithelial erosions; DES dry eye syndrome; MGD meibomian gland dysfunction; ATs artificial tears; PFAT's preservative free artificial tears; [place] nuclear sclerotic cataract; PSC posterior subcapsular cataract; ERM epi-retinal membrane; PVD posterior vitreous detachment; RD retinal detachment; DM diabetes mellitus; DR diabetic retinopathy; NPDR non-proliferative diabetic retinopathy; PDR proliferative diabetic retinopathy; CSME clinically significant macular edema; DME diabetic macular edema; dbh dot blot hemorrhages; CWS cotton wool spot; POAG primary open angle glaucoma; C/D cup-to-disc ratio; HVF humphrey visual field; GVF goldmann visual field; OCT optical coherence tomography; IOP intraocular pressure; BRVO Branch retinal vein occlusion; CRVO central retinal vein occlusion; CRAO central retinal artery occlusion; BRAO branch retinal artery occlusion; RT retinal tear; SB scleral buckle; PPV pars plana vitrectomy; VH Vitreous hemorrhage; PRP panretinal laser photocoagulation; IVK intravitreal kenalog; VMT vitreomacular traction; MH Macular hole;  NVD neovascularization of the disc; NVE neovascularization elsewhere; AREDS age related eye disease study; ARMD age related macular degeneration; POAG primary open angle glaucoma; EBMD epithelial/anterior basement membrane dystrophy; ACIOL anterior chamber intraocular lens; IOL intraocular lens; PCIOL posterior chamber intraocular lens;  Phaco/IOL phacoemulsification with intraocular lens placement; [place] photorefractive keratectomy; LASIK laser assisted in situ keratomileusis; HTN hypertension; DM diabetes mellitus; COPD chronic obstructive pulmonary disease

## 2021-09-15 NOTE — Assessment & Plan Note (Addendum)
The nature of age--related macular degeneration was discussed with the patient as well as the distinction between dry and wet types. Checking an Amsler Grid daily with advice to return immediately should a distortion develop, was given to the patient. The patient 's smoking status now and in the past was determined and advice based on the AREDS study was provided regarding the consumption of antioxidant supplements. AREDS 2 vitamin formulation was recommended. Consumption of dark leafy vegetables and fresh fruits of various colors was recommended. Treatment modalities for wet macular degeneration particularly the use of intravitreal injections of anti-blood vessel growth factors was discussed with the patient. Avastin, Lucentis, and Eylea are the available options. On occasion, therapy includes the use of photodynamic therapy and thermal laser. Stressed to the patient do not rub eyes.  Patient was advised to check Amsler Grid daily and return immediately if changes are noted. Instructions on using the grid were given to the patient. All patient questions were answered.Subfoveal reason, no change over time no sign of CNVM

## 2021-10-10 ENCOUNTER — Other Ambulatory Visit: Payer: Self-pay | Admitting: Cardiovascular Disease

## 2021-10-12 ENCOUNTER — Other Ambulatory Visit: Payer: Self-pay | Admitting: Cardiovascular Disease

## 2021-10-16 DIAGNOSIS — G4733 Obstructive sleep apnea (adult) (pediatric): Secondary | ICD-10-CM | POA: Diagnosis not present

## 2021-11-10 ENCOUNTER — Other Ambulatory Visit: Payer: Self-pay | Admitting: Cardiovascular Disease

## 2021-11-16 DIAGNOSIS — R350 Frequency of micturition: Secondary | ICD-10-CM | POA: Diagnosis not present

## 2021-12-14 ENCOUNTER — Ambulatory Visit (INDEPENDENT_AMBULATORY_CARE_PROVIDER_SITE_OTHER): Payer: Medicare Other | Admitting: Nurse Practitioner

## 2021-12-14 ENCOUNTER — Encounter: Payer: Self-pay | Admitting: Nurse Practitioner

## 2021-12-14 VITALS — BP 118/80 | HR 68 | Temp 98.1°F | Ht 67.0 in | Wt 189.4 lb

## 2021-12-14 DIAGNOSIS — Z9989 Dependence on other enabling machines and devices: Secondary | ICD-10-CM | POA: Diagnosis not present

## 2021-12-14 DIAGNOSIS — Z974 Presence of external hearing-aid: Secondary | ICD-10-CM

## 2021-12-14 DIAGNOSIS — Z2821 Immunization not carried out because of patient refusal: Secondary | ICD-10-CM | POA: Diagnosis not present

## 2021-12-14 DIAGNOSIS — I480 Paroxysmal atrial fibrillation: Secondary | ICD-10-CM | POA: Diagnosis not present

## 2021-12-14 DIAGNOSIS — R972 Elevated prostate specific antigen [PSA]: Secondary | ICD-10-CM

## 2021-12-14 DIAGNOSIS — E782 Mixed hyperlipidemia: Secondary | ICD-10-CM

## 2021-12-14 DIAGNOSIS — I119 Hypertensive heart disease without heart failure: Secondary | ICD-10-CM | POA: Diagnosis not present

## 2021-12-14 DIAGNOSIS — I10 Essential (primary) hypertension: Secondary | ICD-10-CM | POA: Diagnosis not present

## 2021-12-14 DIAGNOSIS — G4733 Obstructive sleep apnea (adult) (pediatric): Secondary | ICD-10-CM | POA: Diagnosis not present

## 2021-12-14 DIAGNOSIS — Z79899 Other long term (current) drug therapy: Secondary | ICD-10-CM

## 2021-12-14 NOTE — Progress Notes (Signed)
I,Mario Palmer,acting as a Education administrator for Mario Brine, FNP.,have documented all relevant documentation on the behalf of Mario Brine, FNP,as directed by  Mario Brine, FNP while in the presence of Mario Palmer, [place].    Subjective:     Patient ID: Mario Palmer , male    DOB: 04/24/1940 , 81 y.o.   MRN: [id number]   Chief Complaint  Patient presents with   Hypertension    HPI  Patient presents today for a bp check. He reports compliance with his medications. He does not have any questions or concerns at this time.  Continues to be followed by Dr. Claiborne Billings. He has had another inguinal hernia repair surgery on November 10th.  Reports this surgery was more difficult than the previous.  Exercising daily 2-2.5 miles a day.  He is now wearing hearing aids bilateral      Hypertension This is a chronic problem. The current episode started more than 1 year ago. The problem is unchanged. The problem is controlled. Pertinent negatives include no anxiety. There are no associated agents to hypertension. There are no known risk factors for coronary artery disease.     Past Medical History:  Diagnosis Date   Arthritis    Cancer ([place])    skin cancer   Dysrhythmia    afib   GERD (gastroesophageal reflux disease)    High cholesterol    History of kidney stones    Hx of echocardiogram 12/2010   EF 55%, There is Stage 1 diastolic dyfunction. Normal LV filling pressure. Aortic sclerosis with mild AI, mild TR   Hypertension    Palpitation    with ecotopy   Sleep apnea      History reviewed. No pertinent family history.   Current Outpatient Medications:    amiodarone (PACERONE) 100 MG tablet, Take 1 tablet (100 mg total) by mouth daily., Disp: 90 tablet, Rfl: 3   atorvastatin (LIPITOR) 20 MG tablet, TAKE ONE TABLET BY MOUTH ONE TIME DAILY **SCHEDULE OFFICE VISIT FOR FURTHER REFILLS**, Disp: 30 tablet, Rfl: 0   CARTIA XT 120 MG 24 hr capsule, TAKE ONE CAPSULE BY MOUTH DAILY AT BEDTIME,  Disp: 90 capsule, Rfl: 1   Cholecalciferol (VITAMIN D) 2000 UNITS tablet, Take 2,000 Units by mouth daily., Disp: , Rfl:    ELIQUIS 2.5 MG TABS tablet, TAKE ONE TABLET BY MOUTH TWICE DAILY, Disp: 180 tablet, Rfl: 0   lisinopril (ZESTRIL) 40 MG tablet, Take 1 tablet (40 mg total) by mouth daily., Disp: 90 tablet, Rfl: 2   Multiple Vitamins-Minerals (MULTIVITAMIN ADULT PO), Take 1 tablet by mouth daily., Disp: , Rfl:    nebivolol (BYSTOLIC) 10 MG tablet, Take 0.5 tablets (5 mg total) by mouth daily., Disp: 90 tablet, Rfl: 1   NON FORMULARY, CPAP therapy, Disp: , Rfl:    Saccharomyces boulardii (PROBIOTIC) 250 MG CAPS, Take 1 capsule by mouth daily., Disp: , Rfl:    spironolactone (ALDACTONE) 25 MG tablet, TAKE ONE TABLET BY MOUTH ONE TIME DAILY, Disp: 90 tablet, Rfl: 0   tamsulosin (FLOMAX) 0.4 MG CAPS capsule, Take 0.4 mg by mouth daily., Disp: , Rfl:    sodium chloride (OCEAN) 0.65 % SOLN nasal spray, Place 1 spray into both nostrils as needed for congestion. (Patient not taking: Reported on 12/14/2021), Disp: , Rfl:    traMADol (ULTRAM) 50 MG tablet, Take 1 tablet (50 mg total) by mouth every 6 (six) hours as needed for moderate pain or severe pain. (Patient not taking: Reported on 06/22/2021),  Disp: 20 tablet, Rfl: 0   No Known Allergies   Review of Systems  Constitutional: Negative.   HENT:  Positive for hearing loss (bilateral).   Respiratory: Negative.    Gastrointestinal: Negative.   Musculoskeletal: Negative.   Skin: Negative.   Allergic/Immunologic: Negative.   Neurological: Negative.   Hematological: Negative.   Psychiatric/Behavioral: Negative.       Today's Vitals   12/14/21 1035  BP: 118/80  Pulse: 68  Temp: 98.1 F (36.7 C)  SpO2: 98%  Weight: 189 lb 6.4 oz (85.9 kg)  Height: _0  (1.702 m)  PainSc: 0-No pain   Body mass index is 29.66 kg/m.  Wt Readings from Last 3 Encounters:  12/14/21 189 lb 6.4 oz (85.9 kg)  06/22/21 195 lb 6.4 oz (88.6 kg)  06/08/21 193 lb  (87.5 kg)    Objective:  Physical Exam      Assessment And Plan:     1. Hypertensive heart disease without heart failure Comments: Blood pressure is well controlled, continue yearly f/u with Dr. Claiborne Billings - CMP14+EGFR  2. Paroxysmal atrial fibrillation (HCC) Comments: Continue f/u with Dr. Claiborne Billings  3. Mixed hyperlipidemia Comments: Cholesterol levels are stable, continue statin, tolerating well - CMP14+EGFR - Lipid panel  4. Elevated PSA Comments: Recent visit with Urologist had digital prostate exam according to notes doing well  - PSA  5. OSA on CPAP Comments: Continue with CPAP improves quality of life  6. Influenza vaccination declined Patient declined influenza vaccination at this time. Patient is aware that influenza vaccine prevents illness in 70% of healthy people, and reduces hospitalizations to 30-70% in elderly. This vaccine is recommended annually. Education has been provided regarding the importance of this vaccine but patient still declined. Advised may receive this vaccine at local pharmacy or Health Dept.or vaccine clinic. Aware to provide a copy of the vaccination record if obtained from local pharmacy or Health Dept.  Pt is willing to accept risk associated with refusing vaccination.   7. Tetanus, diphtheria, and acellular pertussis (Tdap) vaccination declined  8. Herpes zoster vaccination declined Declines shingrix, educated on disease process and is aware if he changes his mind to notify office   9. Other long term (current) drug therapy - CBC  10. Uses hearing aid Comments: Bilateral hearing aid use    Patient was given opportunity to ask questions. Patient verbalized understanding of the plan and was able to repeat key elements of the plan. All questions were answered to their satisfaction.  Mario Brine, FNP   I, Mario Brine, FNP, have reviewed all documentation for this visit. The documentation on 12/14/21 for the exam, diagnosis, procedures, and orders  are all accurate and complete.   IF YOU HAVE BEEN REFERRED TO A SPECIALIST, IT MAY TAKE 1-2 WEEKS TO SCHEDULE/PROCESS THE REFERRAL. IF YOU HAVE NOT HEARD FROM US/SPECIALIST IN TWO WEEKS, PLEASE GIVE [place] A CALL AT [phone].   THE PATIENT IS ENCOURAGED TO PRACTICE SOCIAL DISTANCING DUE TO THE COVID-19 PANDEMIC.

## 2021-12-14 NOTE — Patient Instructions (Signed)
Hypertension, Adult ?Hypertension is another name for high blood pressure. High blood pressure forces your heart to work harder to pump blood. This can cause problems over time. ?There are two numbers in a blood pressure reading. There is a top number (systolic) over a bottom number (diastolic). It is best to have a blood pressure that is below 120/80. ?What are the causes? ?The cause of this condition is not known. Some other conditions can lead to high blood pressure. ?What increases the risk? ?Some lifestyle factors can make you more likely to develop high blood pressure: ?Smoking. ?Not getting enough exercise or physical activity. ?Being overweight. ?Having too much fat, sugar, calories, or salt (sodium) in your diet. ?Drinking too much alcohol. ?Other risk factors include: ?Having any of these conditions: ?Heart disease. ?Diabetes. ?High cholesterol. ?Kidney disease. ?Obstructive sleep apnea. ?Having a family history of high blood pressure and high cholesterol. ?Age. The risk increases with age. ?Stress. ?What are the signs or symptoms? ?High blood pressure may not cause symptoms. Very high blood pressure (hypertensive crisis) may cause: ?Headache. ?Fast or uneven heartbeats (palpitations). ?Shortness of breath. ?Nosebleed. ?Vomiting or feeling like you may vomit (nauseous). ?Changes in how you see. ?Very bad chest pain. ?Feeling dizzy. ?Seizures. ?How is this treated? ?This condition is treated by making healthy lifestyle changes, such as: ?Eating healthy foods. ?Exercising more. ?Drinking less alcohol. ?Your doctor may prescribe medicine if lifestyle changes do not help enough and if: ?Your top number is above 130. ?Your bottom number is above 80. ?Your personal target blood pressure may vary. ?Follow these instructions at home: ?Eating and drinking ? ?If told, follow the DASH eating plan. To follow this plan: ?Fill one half of your plate at each meal with fruits and vegetables. ?Fill one fourth of your plate  at each meal with whole grains. Whole grains include whole-wheat pasta, brown rice, and whole-grain bread. ?Eat or drink low-fat dairy products, such as skim milk or low-fat yogurt. ?Fill one fourth of your plate at each meal with low-fat (lean) proteins. Low-fat proteins include fish, chicken without skin, eggs, beans, and tofu. ?Avoid fatty meat, cured and processed meat, or chicken with skin. ?Avoid pre-made or processed food. ?Limit the amount of salt in your diet to less than 1,500 mg each day. ?Do not drink alcohol if: ?Your doctor tells you not to drink. ?You are pregnant, may be pregnant, or are planning to become pregnant. ?If you drink alcohol: ?Limit how much you have to: ?0-1 drink a day for women. ?0-2 drinks a day for men. ?Know how much alcohol is in your drink. In the [place] one drink equals one 12 oz bottle of beer (355 mL), one 5 oz glass of wine (148 mL), or one 1? oz glass of hard liquor (44 mL). ?Lifestyle ? ?Work with your doctor to stay at a healthy weight or to lose weight. Ask your doctor what the best weight is for you. ?Get at least 30 minutes of exercise that causes your heart to beat faster (aerobic exercise) most days of the week. This may include walking, swimming, or biking. ?Get at least 30 minutes of exercise that strengthens your muscles (resistance exercise) at least 3 days a week. This may include lifting weights or doing Pilates. ?Do not smoke or use any products that contain nicotine or tobacco. If you need help quitting, ask your doctor. ?Check your blood pressure at home as told by your doctor. ?Keep all follow-up visits. ?Medicines ?Take over-the-counter and prescription medicines   only as told by your doctor. Follow directions carefully. ?Do not skip doses of blood pressure medicine. The medicine does not work as well if you skip doses. Skipping doses also puts you at risk for problems. ?Ask your doctor about side effects or reactions to medicines that you should watch  for. ?Contact a doctor if: ?You think you are having a reaction to the medicine you are taking. ?You have headaches that keep coming back. ?You feel dizzy. ?You have swelling in your ankles. ?You have trouble with your vision. ?Get help right away if: ?You get a very bad headache. ?You start to feel mixed up (confused). ?You feel weak or numb. ?You feel faint. ?You have very bad pain in your: ?Chest. ?Belly (abdomen). ?You vomit more than once. ?You have trouble breathing. ?These symptoms may be an emergency. Get help right away. Call 911. ?Do not wait to see if the symptoms will go away. ?Do not drive yourself to the hospital. ?Summary ?Hypertension is another name for high blood pressure. ?High blood pressure forces your heart to work harder to pump blood. ?For most people, a normal blood pressure is less than 120/80. ?Making healthy choices can help lower blood pressure. If your blood pressure does not get lower with healthy choices, you may need to take medicine. ?This information is not intended to replace advice given to you by your health care provider. Make sure you discuss any questions you have with your health care provider. ?Document Revised: 01/06/2021 Document Reviewed: 01/06/2021 ?[employer] Patient Education ? 2023 [employer] ? ?

## 2021-12-15 ENCOUNTER — Other Ambulatory Visit: Payer: Self-pay | Admitting: Cardiovascular Disease

## 2021-12-15 LAB — LIPID PANEL
Chol/HDL Ratio: 2.7 ratio (ref 0.0–5.0)
Cholesterol, Total: 149 mg/dL (ref 100–199)
HDL: 56 mg/dL (ref >39)
LDL Chol Calc (NIH): 72 mg/dL (ref 0–99)
Triglycerides: 115 mg/dL (ref 0–149)
VLDL Cholesterol Cal: 21 mg/dL (ref 5–40)

## 2021-12-15 LAB — CBC
Hematocrit: 40.6 % (ref 37.5–51.0)
Hemoglobin: 13.6 g/dL (ref 13.0–17.7)
MCH: 31.1 pg (ref 26.6–33.0)
MCHC: 33.5 g/dL (ref 31.5–35.7)
MCV: 93 fL (ref 79–97)
Platelets: 215 10*3/uL (ref 150–450)
RBC: 4.37 x10E6/uL (ref 4.14–5.80)
RDW: 12.1 % (ref 11.6–15.4)
WBC: 7.9 10*3/uL (ref 3.4–10.8)

## 2021-12-15 LAB — CMP14+EGFR
ALT: 16 IU/L (ref 0–44)
AST: 18 IU/L (ref 0–40)
Albumin/Globulin Ratio: 1.7 (ref 1.2–2.2)
Albumin: 4.2 g/dL (ref 3.7–4.7)
Alkaline Phosphatase: 70 IU/L (ref 44–121)
BUN/Creatinine Ratio: 19 (ref 10–24)
BUN: 28 mg/dL — ABNORMAL HIGH (ref 8–27)
Bilirubin Total: 0.6 mg/dL (ref 0.0–1.2)
CO2: 22 mmol/L (ref 20–29)
Calcium: 9.9 mg/dL (ref 8.6–10.2)
Chloride: 106 mmol/L (ref 96–106)
Creatinine, Ser: 1.51 mg/dL — ABNORMAL HIGH (ref 0.76–1.27)
Globulin, Total: 2.5 g/dL (ref 1.5–4.5)
Glucose: 84 mg/dL (ref 70–99)
Potassium: 5 mmol/L (ref 3.5–5.2)
Sodium: 142 mmol/L (ref 134–144)
Total Protein: 6.7 g/dL (ref 6.0–8.5)
eGFR: 46 mL/min/{1.73_m2} — ABNORMAL LOW (ref >59)

## 2021-12-15 LAB — PSA: Prostate Specific Ag, Serum: 11.1 ng/mL — ABNORMAL HIGH (ref 0.0–4.0)

## 2022-01-10 ENCOUNTER — Other Ambulatory Visit: Payer: Self-pay | Admitting: Cardiovascular Disease

## 2022-01-10 DIAGNOSIS — I480 Paroxysmal atrial fibrillation: Secondary | ICD-10-CM

## 2022-01-10 NOTE — Telephone Encounter (Signed)
Prescription refill request for Eliquis received. Indication: atrial fibriliation Last office visit:01/26/2022 Coletta Memos NP, Dr. Claiborne Billings) Scr: 1.51 (12/14/2021)  Age: 81 Weight:85.9 kg    Appropriate dose. RX renewed for 3 months sent to the pharmacy.

## 2022-01-14 DIAGNOSIS — G4733 Obstructive sleep apnea (adult) (pediatric): Secondary | ICD-10-CM | POA: Diagnosis not present

## 2022-01-21 ENCOUNTER — Other Ambulatory Visit: Payer: Self-pay | Admitting: Cardiovascular Disease

## 2022-02-27 ENCOUNTER — Other Ambulatory Visit: Payer: Self-pay | Admitting: Cardiovascular Disease

## 2022-03-03 ENCOUNTER — Ambulatory Visit: Payer: Medicare Other | Attending: Cardiovascular Disease | Admitting: Cardiovascular Disease

## 2022-03-03 ENCOUNTER — Encounter: Payer: Self-pay | Admitting: Cardiovascular Disease

## 2022-03-03 DIAGNOSIS — I480 Paroxysmal atrial fibrillation: Secondary | ICD-10-CM

## 2022-03-03 DIAGNOSIS — I10 Essential (primary) hypertension: Secondary | ICD-10-CM | POA: Diagnosis not present

## 2022-03-03 DIAGNOSIS — G4733 Obstructive sleep apnea (adult) (pediatric): Secondary | ICD-10-CM

## 2022-03-03 DIAGNOSIS — R972 Elevated prostate specific antigen [PSA]: Secondary | ICD-10-CM | POA: Diagnosis not present

## 2022-03-03 DIAGNOSIS — E782 Mixed hyperlipidemia: Secondary | ICD-10-CM

## 2022-03-03 DIAGNOSIS — Z7901 Long term (current) use of anticoagulants: Secondary | ICD-10-CM | POA: Diagnosis not present

## 2022-03-03 NOTE — Progress Notes (Signed)
Patient ID: Mario Palmer, male   DOB: [birth year], 81 y.o.   MRN: [id number]        Primary M.D.: Dr. Bryon Lions  HPI: Mario Palmer is a 81 y.o. male who presents for an 58 month follow-up evaluation.  Mario Palmer has a history of hypertension, obstructive sleep apnea on CPAP therapy, GERD, and palpitations with atrial ectopy,. 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 He has history of hyperlipidemia and has been on simvastatin and had intermittently stopped this secondary to arthralgias.  An echo Doppler study in January 2015 revealed a normal systolic function with ejection fraction of 55-60%. There was evidence for mild aortic and mitral insufficiency. His left atrium was moderately dilated.  Mario Palmer was diagnosed with obstructive sleep apnea in 2010. He had mild sleep apnea with events worse in the supine position and during REM sleep. He has been on CPAP therapy since that time and initially he was started on an 8 cm water pressure but also this was later titrated to 10 cm.  He admits to 100% compliance.  His initial machine malfunctioned in 2017 and  He received a new AirSense 10 CPAP unit.  He has noticed significant improvement in this new machine.  Compared to his old one.  He admits to 100% compliance.  He goes to bed between 9:30 9:45 AM and wakes up once around 2:30 to the bathroom and typically sleeps 9-10 hours per night.  A download was obtained from his new machine from 05/07/2015 through 06/05/2015.  This revealed 100% compliance.  He is averaging 9 hours and 53 minutes per night.  He is set at a 10 cm water pressure.  His AHI was excellent at 1.1.  He uses a fullface mask.  There was no leak.    When I saw him in March 2018 his ECG demonstrated atrial fibrillation.  He was in this of questionable duration.  At that time, I initiated anticoagulation witheliquis  5 mg twice a day.  I discontinued amlodipine and started Cardizem CD 180 mg.  He underwent an  echo Doppler study on 06/23/2016 which showed an EF of 55-60%.  He did not have wall motion abnormalities.  There was mild aortic sclerosis with mildAR and mild focal calcification of the anterior mitral valve leaflet with trivial MR.  His right ventricle is mildly dilated.  His left atrium was severely dilated and measured 64 mm parasternally.  I initiated therapy with amiodarone as an antiarrhythmic agent, with the greatest likelihood for pharmacologic success.  I decrease Bystolic to 5 mg.  He started amiodarone at 200 mg first week and ultimately titrated this to 200 mg twice a day.  He has felt improved with control his ventricular rate.  He notices that his blood pressure is slightly increased on the reduced dose of Bystolic.  He admitted to 100% compliance with CPAP therapy.  He underwent successful DC cardioversion by me on 08/18/2016 with recurrence of sinus rhythm.  He has noticed improved energy since restoration of sinus rhythm.  He is unaware of any significant ectopy but does note a rare palpitation.  He has been exercising and pedals on a stationary bike for at least 40 minutes without chest pain or shortness of breath.  When I saw him in September 2018 he was unaware of recurrent atrial fibrillation.  He does admit to occasional palpitations at night.  Apparently, he had been on Bystolic 10 mg but inadvertently when he called  his primary physician Bystolic was renewed at 5 mg instead of the 10 mg dose.  The palpitations seem to have occurred on the reduced dose.  I last saw him in December and resume bystolic at 10 mg.  This has improved his palpitations and blood pressure.  He continues to use CPAP with 100% compliance.   A download from August 21 through 12/20/2016 revealed 100% of use with an average sleep duration of 9 hours and 11 minutes.  His AHI is excellent at 1.7 on a 10 cm set pressure.  His amiodarone dose was reduced in December and he now is on 200 mg daily, in addition to Cartia  240 mg, lisinopril 40 mg in addition to spironolactone 25 mg daily for hypertension.  He called the office with complaints of his tongue becoming exceedingly dry.  After several hours of CPAP use.  He has a fullface mask.  He breathes through his mouth and remotely had used a nasal mask was switched to fullface mask due to oral venting.  He had complained of a very dry mouth, making it difficult for him to continue to sleep.  He was worked in and seen as an add-on one month ago.  At that time, we readjusted his medication.  We had subsequent telephone conversations with additional adjustment.  He went to his DME company and they adjusted his tube temperature to 70.    He underwent colonoscopy and endoscopy by Dr. Earlean Shawl and was found to have 9 polyps.  Apparently the biopsy was benign.  When I last saw him early April 2019 he was continuing to have difficulty with his new CPAP machine.  Subsequently he saw [facility] as his DME company.  He was given a chinstrap.  Addition, he was advised by his daughter-in-law's dentist to use Somnifix to assist his mouth breathing.  He feels since implementing this mouth taped, he has noticed marked improvement in his sleep.  He is now sleeping at least 9 hours.  His sleep is restorative.  He has noticed his blood pressure now is significantly more controlled.  He brought with him blood pressure readings from home and his systolic blood pressures have ranged from in the low to mid 90s to the 120s.  He has been unaware of any recurrent episodes of atrial fibrillation.  He denies episodes of chest pain.  He continues to be on anticoagulation for PAF.  He had follow-up laboratory in April which showed a total cholesterol 188 triglycerides 190 LDL 93 HDL 57.  PSA was increased at 6.2.  Creatinine was 1.32.  He had previously seen Dr. Bryon Lions for primary care but apparently her office is no longer taking [employer].    When I saw him in August 2019,  he was maintaining sinus rhythm on amiodarone 200 mg daily in addition to Bystolic 5 mg.  He also was on lisinopril 40 mg in addition to spironolactone for hypertension.  He had been on low-dose simvastatin and particularly with its interaction with Cardizem and an LDL of 93 I suggested he discontinue simvastatin and started him on atorvastatin 20 mg.  He tells me he had retinal surgery in January.  He had undergone excision of a squamous cell cancer from his abdomen and was told of clear margins.  He fell on his face after he tripped on his shoelace resulting in significant ecchymoses to his nose and under his eyes bilaterally.  He continues to use CPAP.  During that  evaluation, I recommended he discontinue aspirin but continue Eliquis.  He had undergone  laparoscopic double hernia surgery on November 28, 2018 by Dr. Ninfa Linden.  He subsequently required hospitalization for development of a postoperative ileus.  When I saw him in September 2020 he denied any chest pain or awareness of any abnormal rhythm.  He did have some intermittent leg cramping at night.  He continues to be compliant with CPAP therapy.  During that evaluation with his renal insufficiency I suggested slight reduction of spironolactone to 12.5 mg daily.  He continues to be compliant with CPAP therapy.  I saw him for follow-up evaluation in July 2021 and over the prior 10 months he had remained stable and denied chest pain, PND, orthopnea.  His pulse rate had been in the 50s.  During that evaluation, ECG revealed sinus bradycardia and I recommended he reduce his amiodarone from 200 mg daily to 100 mg alternating with 200 mg every other day.  He had continued to use CPAP therapy and a download revealed 100% compliance with an AHI of 1.2/h at 10 cm water pressure. He has continued to be on anticoagulation.    I saw him in October 2021 at which time he continued to feel well and denied any orthostatic symptoms.  He continues to be on diltiazem 120  mg at bedtime, lisinopril 40 mg daily in addition to spironolactone 25 mg daily for blood pressure control.  He was on atorvastatin for lipid therapy.  He continued to use CPAP therapy.  A new download was obtained from September 18 through January 18, 2020 which confirmed 100% compliance with average use at 7 hours and 17 minutes.  At 10 cm water pressure AHI is 2.5/h.  He was tolerating low-dose Eliquis due to his age and creatinine most recently 1.58.  I last saw him on Aug 25, 2020 at which time he felt well.  He denied any chest pain, shortness of breath, or anginal symptoms.  He was unaware of any heart rate irregularity.  He continues to use CPAP with excellent compliance.  A download was obtained from March 15 through July 14, 2020 which shows 100% use with average use of 5 hours and 45 minutes.  His CPAP set up date was January 2017 and as result he has a 3G wireless system.  Unfortunately in mid April 2022 with [employer] upgrade, only 5G units are able to be continued to be downloadable wirelessly.  On his download at 10 cm set pressure AHI was 7.1.  He does not have a CPAP auto unit but has a ResMed air sense 10 CPAP device.  During that evaluation, his ECG showed sinus bradycardia at 57 bpm.  His machine was no longer while or less and he qualified for new machine.  In the interim I recommended changing his pressure setting from a set pressure of 10 to a set pressure of 20 cm of water since he did not have an auto mode CPAP unit.  Since I last saw him, he has felt well.  He is walking 2 miles at least 4 days/week on concrete.  He denies any chest pain or shortness of breath.  He is still using his old CPAP machine.  He brought this with him today and we were able to obtain a download off his card.  At 12 cm, AHI is 1.0.  He typically goes to bed around 9:30 PM and wakes up around 7.  However CPAP usage was only 4 hours and  38 minutes per night.  He is followed by urology with elevated PSA.  He is unaware  of any recurrent rhythm disturbance.  He presents for cardiology/sleep evaluation.  Past Medical History:  Diagnosis Date   Arthritis    Cancer ([place])    skin cancer   Dysrhythmia    afib   GERD (gastroesophageal reflux disease)    High cholesterol    History of kidney stones    Hx of echocardiogram 12/2010   EF 55%, There is Stage 1 diastolic dyfunction. Normal LV filling pressure. Aortic sclerosis with mild AI, mild TR   Hypertension    Palpitation    with ecotopy   Sleep apnea     Past Surgical History:  Procedure Laterality Date   CARDIOVERSION N/A 09/12/2016   Procedure: CARDIOVERSION;  Surgeon: Troy Sine, MD;  Location: [facility] ENDOSCOPY;  Service: Cardiovascular;  Laterality: N/A;   EYE SURGERY Bilateral    cataract   INGUINAL HERNIA REPAIR Left 11/28/2018   Procedure: LAPAROSCOPIC  LEFT INGUINAL HERNIA WITH MESH;  Surgeon: Coralie Keens, MD;  Location: [place];  Service: General;  Laterality: Left;   INGUINAL HERNIA REPAIR Right 02/10/2021   Procedure: HERNIA REPAIR RIGHT INGUINAL;  Surgeon: Coralie Keens, MD;  Location: [facility] ORS;  Service: General;  Laterality: Right;   MOHS SURGERY Left    skin cancer remover from left side   RETINAL DETACHMENT SURGERY Right    RETINAL LASER PROCEDURE Right 04/2018   SKIN CANCER EXCISION  07/2018   TONSILLECTOMY     UMBILICAL HERNIA REPAIR N/A 11/28/2018   Procedure: OPEN UMBILICAL HERNIA REPAIR WITH MESH;  Surgeon: Coralie Keens, MD;  Location: [place];  Service: General;  Laterality: N/A;    No Known Allergies  Current Outpatient Medications  Medication Sig Dispense Refill   amiodarone (PACERONE) 100 MG tablet Take 1 tablet (100 mg total) by mouth daily. 90 tablet 3   apixaban (ELIQUIS) 2.5 MG TABS tablet Take 1 tablet (2.5 mg total) by mouth 2 (two) times daily. Please call office for an appointment. 180 tablet 0   atorvastatin (LIPITOR) 20 MG tablet Take 1 tablet (20 mg total) by mouth daily. SCHEDULE OFFICE VISIT FOR  FUTURE REFILLS. 30 tablet 0   CARTIA XT 120 MG 24 hr capsule TAKE ONE CAPSULE BY MOUTH DAILY AT BEDTIME 90 capsule 1   Cholecalciferol (VITAMIN D) 2000 UNITS tablet Take 2,000 Units by mouth daily.     Multiple Vitamins-Minerals (MULTIVITAMIN ADULT PO) Take 1 tablet by mouth daily.     nebivolol (BYSTOLIC) 10 MG tablet Take 0.5 tablets (5 mg total) by mouth daily. 90 tablet 1   NON FORMULARY CPAP therapy     Saccharomyces boulardii (PROBIOTIC) 250 MG CAPS Take 1 capsule by mouth daily.     sodium chloride (OCEAN) 0.65 % SOLN nasal spray Place 1 spray into both nostrils as needed for congestion.     spironolactone (ALDACTONE) 25 MG tablet TAKE ONE TABLET BY MOUTH ONE TIME DAILY (Patient taking differently: 12.5 mg daily.) 30 tablet 2   tamsulosin (FLOMAX) 0.4 MG CAPS capsule Take 0.4 mg by mouth daily.     lisinopril (ZESTRIL) 40 MG tablet TAKE ONE TABLET BY MOUTH ONE TIME DAILY 90 tablet 3   traMADol (ULTRAM) 50 MG tablet Take 1 tablet (50 mg total) by mouth every 6 (six) hours as needed for moderate pain or severe pain. 20 tablet 0   No current facility-administered medications for this visit.  Social History   Socioeconomic History   Marital status: Married    Spouse name: Not on file   Number of children: Not on file   Years of education: Not on file   Highest education level: Not on file  Occupational History   Occupation: retired  Tobacco Use   Smoking status: Former    Types: Cigarettes   Smokeless tobacco: Never   Tobacco comments:    quit smoking in 1982  Vaping Use   Vaping Use: Never used  Substance and Sexual Activity   Alcohol use: No   Drug use: No   Sexual activity: Yes  Other Topics Concern   Not on file  Social History Narrative   Not on file   Social Determinants of Health   Financial Resource Strain: Low Risk  (06/22/2021)   Overall Financial Resource Strain (CARDIA)    Difficulty of Paying Living Expenses: Not hard at all  Food Insecurity: No Food  Insecurity (06/22/2021)   Hunger Vital Sign    Worried About Running Out of Food in the Last Year: Never true    [place] in the Last Year: Never true  Transportation Needs: No Transportation Needs (06/22/2021)   PRAPARE - Hydrologist (Medical): No    Lack of Transportation (Non-Medical): No  Physical Activity: Sufficiently Active (06/22/2021)   Exercise Vital Sign    Days of Exercise per Week: 6 days    Minutes of Exercise per Session: 90 min  Stress: No Stress Concern Present (06/22/2021)   [place]    Feeling of Stress : Not at all  Social Connections: Not on file  Intimate Partner Violence: Not At Risk (08/01/2018)   Humiliation, Afraid, Rape, and Kick questionnaire    Fear of Current or Ex-Partner: No    Emotionally Abused: No    Physically Abused: No    Sexually Abused: No    No family history on file.  Both parents are deceased.  Social history is notable in that he is married; 2 children and 2 grandchildren. 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 There is no tobacco or alcohol use.  ROS General: Negative; No fevers, chills, or night sweats;  HEENT: Negative; No changes in vision or hearing, sinus congestion, difficulty swallowing Pulmonary: Negative; No cough, wheezing, shortness of breath, hemoptysis Cardiovascular: No chest pain or recent palpitations GI: Negative; No nausea, vomiting, diarrhea, or abdominal pain GU: PSA elevation, followed by urology  Musculoskeletal: Negative; no myalgias, joint pain, or weakness Hematologic/Oncology: Negative; no easy bruising, bleeding Endocrine: Negative; no heat/cold intolerance; no diabetes Neuro: Negative; no changes in balance, headaches Skin: Negative; No rashes or skin lesions Psychiatric: Negative; No behavioral problems, depression Sleep: Positive for obstructive sleep apnea on CPAP therapy.  Occasional leg  cramps at night the urge to move without definitive restless leg syndrome; no snoring, daytime sleepiness, hypersomnolence, bruxism, hypnogognic hallucinations, no cataplexy Other comprehensive 14 point system review is negative.  PE: BP 132/68 (BP Location: Left Arm, Patient Position: Sitting, Cuff Size: Large)   Pulse (!) 53   Ht _0  (1.778 m)   Wt 193 lb (87.5 kg)   SpO2 99%   BMI 27.69 kg/m    Repeat blood pressure by me 118/70  Wt Readings from Last 3 Encounters:  09/22/16 192 lb (87.1 kg)  09/12/16 198 lb (89.8 kg)  08/18/16 194 lb (88 kg)   General:  Alert, oriented, no distress.  Skin: normal turgor, no rashes, warm and dry HEENT: Normocephalic, atraumatic. Pupils equal round and reactive to light; sclera anicteric; extraocular muscles intact;  Nose without nasal septal hypertrophy Mouth/Parynx benign; Mallinpatti scale 3 Neck: No JVD, no carotid bruits; normal carotid upstroke Lungs: clear to ausculatation and percussion; no wheezing or rales Chest wall: without tenderness to palpitation Heart: PMI not displaced, RRR, s1 s2 normal, 1/6 systolic murmur, no diastolic murmur, no rubs, gallops, thrills, or heaves Abdomen: soft, nontender; no hepatosplenomehaly, BS+; abdominal aorta nontender and not dilated by palpation. Back: no CVA tenderness Pulses 2+ Musculoskeletal: full range of motion, normal strength, no joint deformities Extremities: no clubbing cyanosis or edema, Homan's sign negative  Neurologic: grossly nonfocal; Cranial nerves grossly wnl Psychologic: Normal mood and affect    March 03, 2022 ECG (independently read by me): Sinus bradycardia at 53, 1st degree AV bl;ock; PR 218 msec  Aug 25, 2020 ECG (independently read by me): Sinus bradycardia at 57, borderline 1st degree AV block    October 2021 ECG (independently read by me): Sinus Bradycardia at 54; 1st degree AV block; PR 210 msec; QTc 440 msec  October 13, 2019 ECG (independently read by me): Sinus  bradycardia at 51 bpm, first-degree AV block with PR interval 226 ms.Marland Kitchen  QTc interval 440 ms.  December 30, 2018 ECG (independently read by me): Sinus bradycardia at 55 bpm.  First-degree AV block with a.PR interval 230 msec.  Early transition.  March 2020ECG (independently read by me): Normal sinus rhythm at 62 bpm.  PR interval 200 ms.  QTc interval 444 ms.  No ectopy.  November 28, 2017 ECG (independently read by me): Sinus Bradycardia at 51, degree AV block.  No ectopy.  April 2019 ECG (independently read by me): Sinus bradycardia at 49 bpm with mild sinus arrhythmia.  First-degree AV block with a paratubal 2 to 2 ms.  No ST segment changes.  06/01/2017 ECG (independently read by me):Sinus bradycardia 58 bpm.  First-degree AV block.  05/11/2017 ECG (independently read by me): Sinus bradycardia at 67 bpm.  First degree AV block with a PR interval 214 ms.  QTC normal 434 ms.  December 2018 ECG (independently read by me):  Normal sinus rhythm at 63 bpm. PR interval 220m.  QTc nterval 466 ms. No significant ST-T changes.  September 2018 ECG (independently read by me): Sinus bradycardia 52 bpm.  QTC increased at 487 ms.  No CMV ST-T change.  June 2018 ECG (independently read by me): Normal sinus rhythm at 62 bpm.  Left axis deviation.  QTc interval 479 ms.  No significant ST-T changes  May 2018 ECG (independently read by me): atrial flib  /flutter with coarse laboratory wave.  07/13/2016 ECG (independently read by me): Atrial fibrillation with a ventricular rate in the 50s.  Mild RV conduction delay.  March 2018 ECG (independently read by me): Atrial fibrillation at 63 bpm.  QTc interval 427 ms.  March 2016 ECG (independently read by me): Sinus bradycardia 59 bpm.  Mild RV conduction delay.  Normal intervals.  March 2015 ECG: Normal sinus rhythm at 60 beats per minute. No ectopy. Nonspecific T changes in lead 3.  02/04/2013 ECG: Sinus bradycardia with PACs.  In the office today I obtained  a new download from October 29, 2017 through November 27, 2017.  During this time he has been using some to fix on a daily basis.  Compliance is 100%.  He is averaging 9 hours and 19  minutes of sleep per night.  At a 10 cm set pressure, AHI is excellent at 1.2.  There is no leak.  LABS:     Latest Ref Rng & Units 12/14/2021   11:09 AM 06/08/2021   10:49 AM 02/03/2021   10:12 AM  BMP  Glucose 70 - 99 mg/dL 84  77  71   BUN 8 - 27 mg/dL _0 Creatinine 0.76 - 1.27 mg/dL 1.51  1.47  1.49   BUN/Creat Ratio 10 - _1 Sodium 134 - 144 mmol/L 142  141  138   Potassium 3.5 - 5.2 mmol/L 5.0  5.3  4.9   Chloride 96 - 106 mmol/L 106  104  107   CO2 20 - 29 mmol/L _2 Calcium 8.6 - 10.2 mg/dL 9.9  10.2  9.5        Latest Ref Rng & Units 12/14/2021   11:09 AM 06/08/2021   10:49 AM 08/25/2020    4:09 PM  Hepatic Function  Total Protein 6.0 - 8.5 g/dL 6.7  6.8  6.7   Albumin 3.7 - 4.7 g/dL 4.2  4.5  4.4   AST 0 - 40 IU/L _3 ALT 0 - 44 IU/L _4 Alk Phosphatase 44 - 121 IU/L 70  70  65   Total Bilirubin 0.0 - 1.2 mg/dL 0.6  0.8  0.7       Latest Ref Rng & Units 12/14/2021   11:09 AM 06/08/2021   10:49 AM 02/03/2021   10:12 AM  CBC  WBC 3.4 - 10.8 x10E3/uL 7.9  6.5  6.7   Hemoglobin 13.0 - 17.7 g/dL 13.6  14.2  12.7   Hematocrit 37.5 - 51.0 % 40.6  41.2  38.4   Platelets 150 - 450 x10E3/uL 215  220  218    Lab Results  Component Value Date   MCV 93 12/14/2021   MCV 93 06/08/2021   MCV 96.7 02/03/2021   Lab Results  Component Value Date   TSH 1.990 08/25/2020     BNP No results found for: "PROBNP"  Lipid Panel     Component Value Date/Time   CHOL 149 12/14/2021 1109   TRIG 115 12/14/2021 1109   HDL 56 12/14/2021 1109   CHOLHDL 2.7 12/14/2021 1109   CHOLHDL 3.0 06/07/2016 1030   VLDL 28 06/07/2016 1030   LDLCALC 72 12/14/2021 1109    RADIOLOGY: No results found.  IMPRESSION:  1. Essential hypertension   2. OSA on CPAP   3.  Paroxysmal atrial fibrillation (HCC)   4. Elevated PSA   5. Anticoagulated   6. Mixed hyperlipidemia     ASSESSMENT AND PLAN: Mario Palmer is a young appearing 81year-old  gentleman who has a history of hypertension, obstructive sleep apnea, palpitations with atrial flutter/atrial fibrillation and hyperlipidemia.  He developed atrial fibrillation/flutter and after titration of amiodarone underwent successful DC cardioversion on 08/18/2016 with resolution of atrial flutter and restoration of sinus rhythm.  He has been on chronic eliquis for anticoagulation and denies bleeding.  Over the years his dose of amiodarone has slightly been reduced and when last seen, he was changed to 100 mg alternating with 200 mg.  Presently, he continues to feel well.  He is walking at least 2 miles 4 days/week on concrete.  He denies chest pain, exertional dyspnea, or  recurrent palpitations.  He continues to be on amiodarone 100 mg daily and Eliquis reduced dose 2.5 mg twice daily.  He is on nebivolol 5 mg daily, lisinopril 40 mg, Cartia XT 120 mg and spironolactone 25 mg daily for hypertension.  He is on tamsulosin followed by urology and has elevated PSA, most recently 11.1 on December 14, 2021.  He continues to use CPAP with excellent compliance however I discussed with him optimal sleep duration with CPAP is for the nights entirety at 7 and 8 hours if at all possible.  He qualifies for a new CPAP machine with his previous CPAP unit not being in auto mode and with set up date on April 16, 2015.  I will order him a new ResMed AirSense 11 CPAP auto unit and will set a pressure range of 11 to 16 cm of water.  Blood pressure today is stable on his current regimen.  He will be seeing Zenovia Jarred, FNP for follow-up who is his primary provider.  I will see him within 3 months of his obtaining a new CPAP unit.   Troy Sine, MD, [facility]  03/11/2022 11:52 AM

## 2022-03-03 NOTE — Patient Instructions (Signed)
Medication Instructions:  The current medical regimen is effective;  continue present plan and medications.  *If you need a refill on your cardiac medications before your next appointment, please call your pharmacy*  Follow-Up: At [facility], you and your health needs are our priority.  As part of our continuing mission to provide you with exceptional heart care, we have created designated Provider Care Teams.  These Care Teams include your primary Cardiologist (physician) and Advanced Practice Providers (APPs -  Physician Assistants and Nurse Practitioners) who all work together to provide you with the care you need, when you need it.  We recommend signing up for the patient portal called "MyChart".  Sign up information is provided on this After Visit Summary.  MyChart is used to connect with patients for Virtual Visits (Telemedicine).  Patients are able to view lab/test results, encounter notes, upcoming appointments, etc.  Non-urgent messages can be sent to your provider as well.   To learn more about what you can do with MyChart, go to [email or web address].    Your next appointment:   4-5 month(s)  The format for your next appointment:   In Person  Provider:   [name], MD

## 2022-03-10 ENCOUNTER — Other Ambulatory Visit: Payer: Self-pay | Admitting: Cardiovascular Disease

## 2022-03-10 DIAGNOSIS — I10 Essential (primary) hypertension: Secondary | ICD-10-CM

## 2022-03-11 ENCOUNTER — Encounter: Payer: Self-pay | Admitting: Cardiovascular Disease

## 2022-03-29 ENCOUNTER — Other Ambulatory Visit: Payer: Self-pay | Admitting: Cardiovascular Disease

## 2022-04-01 ENCOUNTER — Other Ambulatory Visit: Payer: Self-pay | Admitting: Cardiovascular Disease

## 2022-04-14 DIAGNOSIS — G4733 Obstructive sleep apnea (adult) (pediatric): Secondary | ICD-10-CM | POA: Diagnosis not present

## 2022-04-18 ENCOUNTER — Telehealth: Payer: Self-pay | Admitting: *Deleted

## 2022-04-18 NOTE — Telephone Encounter (Signed)
Order for replacement CPAP machine sent to [facility] via [facility] portal.

## 2022-04-25 ENCOUNTER — Other Ambulatory Visit: Payer: Self-pay | Admitting: Cardiovascular Disease

## 2022-04-25 DIAGNOSIS — I480 Paroxysmal atrial fibrillation: Secondary | ICD-10-CM

## 2022-04-25 NOTE — Telephone Encounter (Signed)
Prescription refill request for Eliquis received. Indication:Afib  Last office visit: 03/03/22 [facility] Billings)  Scr: 1.51 (12/14/21)  Age: 82 Weight: 87.5kg  Appropriate dose and refill sent to requested pharmacy.

## 2022-04-30 ENCOUNTER — Other Ambulatory Visit: Payer: Self-pay | Admitting: Cardiovascular Disease

## 2022-05-11 ENCOUNTER — Telehealth: Payer: Self-pay | Admitting: Cardiovascular Disease

## 2022-05-11 NOTE — Telephone Encounter (Signed)
*  STAT* If patient is at the pharmacy, call can be transferred to refill team.   1. Which medications need to be refilled? (please list name of each medication and dose if known)   nebivolol (BYSTOLIC) 10 MG tablet   2. Which pharmacy/location (including street and city if local pharmacy) is medication to be sent to?  [place], [address]   3. Do they need a 30 day or 90 day supply?   90 day  Caller stated patient is changing pharmacy from [facility] to [facility].  Caller still has some of this medication.

## 2022-05-19 DIAGNOSIS — G4733 Obstructive sleep apnea (adult) (pediatric): Secondary | ICD-10-CM | POA: Diagnosis not present

## 2022-05-29 ENCOUNTER — Other Ambulatory Visit: Payer: Self-pay | Admitting: Cardiovascular Disease

## 2022-06-17 DIAGNOSIS — G4733 Obstructive sleep apnea (adult) (pediatric): Secondary | ICD-10-CM | POA: Diagnosis not present

## 2022-06-21 ENCOUNTER — Telehealth: Payer: Self-pay | Admitting: Cardiovascular Disease

## 2022-06-21 NOTE — Telephone Encounter (Signed)
*  STAT* If patient is at the pharmacy, call can be transferred to refill team.   1. Which medications need to be refilled? (please list name of each medication and dose if known) nebivolol (BYSTOLIC) 10 MG tablet   2. Which pharmacy/location (including street and city if local pharmacy) is medication to be sent to?   Take 0.5 tablets (5 mg total) by mouth daily.    3. Do they need a 30 day or 90 day supply? 90 day

## 2022-06-23 MED ORDER — NEBIVOLOL HCL 10 MG PO TABS: 5.0000 mg | ORAL_TABLET | Freq: Every day | ORAL | 3 refills | Status: DC

## 2022-06-23 NOTE — Telephone Encounter (Signed)
Refill of Bystolic has been sent to [facility].

## 2022-06-29 ENCOUNTER — Ambulatory Visit: Payer: Medicare Other | Admitting: Nurse Practitioner

## 2022-07-12 ENCOUNTER — Ambulatory Visit: Payer: Medicare Other | Attending: Cardiovascular Disease | Admitting: Cardiovascular Disease

## 2022-07-12 ENCOUNTER — Encounter: Payer: Self-pay | Admitting: Cardiovascular Disease

## 2022-07-12 VITALS — BP 112/74 | HR 67 | Ht 70.0 in | Wt 191.4 lb

## 2022-07-12 DIAGNOSIS — Z5181 Encounter for therapeutic drug level monitoring: Secondary | ICD-10-CM

## 2022-07-12 DIAGNOSIS — Z79899 Other long term (current) drug therapy: Secondary | ICD-10-CM

## 2022-07-12 DIAGNOSIS — I10 Essential (primary) hypertension: Secondary | ICD-10-CM

## 2022-07-12 DIAGNOSIS — G4733 Obstructive sleep apnea (adult) (pediatric): Secondary | ICD-10-CM | POA: Diagnosis not present

## 2022-07-12 DIAGNOSIS — I4892 Unspecified atrial flutter: Secondary | ICD-10-CM | POA: Diagnosis not present

## 2022-07-12 DIAGNOSIS — Z7901 Long term (current) use of anticoagulants: Secondary | ICD-10-CM | POA: Diagnosis not present

## 2022-07-12 DIAGNOSIS — N1831 Chronic kidney disease, stage 3a: Secondary | ICD-10-CM

## 2022-07-12 DIAGNOSIS — E782 Mixed hyperlipidemia: Secondary | ICD-10-CM | POA: Diagnosis not present

## 2022-07-12 DIAGNOSIS — R972 Elevated prostate specific antigen [PSA]: Secondary | ICD-10-CM

## 2022-07-12 LAB — COMPREHENSIVE METABOLIC PANEL

## 2022-07-12 LAB — CBC
MCHC: 33.5 g/dL (ref 31.5–35.7)
Platelets: 225 10*3/uL (ref 150–450)
RDW: 12.7 % (ref 11.6–15.4)

## 2022-07-12 LAB — LIPID PANEL

## 2022-07-12 LAB — PSA

## 2022-07-12 MED ORDER — AMIODARONE HCL 200 MG PO TABS: ORAL_TABLET | ORAL | 3 refills | Status: DC

## 2022-07-12 NOTE — Patient Instructions (Signed)
Medication Instructions:  INCREASE amiodarone to 200 mg (twice daily) for 1 week, then decrease back to 200 mg daily   *If you need a refill on your cardiac medications before your next appointment, please call your pharmacy*   Lab Work: CMET, MAG, CBC, TSH, PSA, LIPID today   If you have labs (blood work) drawn today and your tests are completely normal, you will receive your results only by: MyChart Message (if you have MyChart) OR A paper copy in the mail If you have any lab test that is abnormal or we need to change your treatment, we will call you to review the results.   Testing/Procedures: Echocardiogram - Your physician has requested that you have an echocardiogram. Echocardiography is a painless test that uses sound waves to create images of your heart. It provides your doctor with information about the size and shape of your heart and how well your heart's chambers and valves are working. This procedure takes approximately one hour. There are no restrictions for this procedure.     Follow-Up: At [facility], you and your health needs are our priority.  As part of our continuing mission to provide you with exceptional heart care, we have created designated Provider Care Teams.  These Care Teams include your primary Cardiologist (physician) and Advanced Practice Providers (APPs -  Physician Assistants and Nurse Practitioners) who all work together to provide you with the care you need, when you need it.  We recommend signing up for the patient portal called "MyChart".  Sign up information is provided on this After Visit Summary.  MyChart is used to connect with patients for Virtual Visits (Telemedicine).  Patients are able to view lab/test results, encounter notes, upcoming appointments, etc.  Non-urgent messages can be sent to your provider as well.   To learn more about what you can do with MyChart, go to [email or web address].    Your next appointment:   3  month(s)  Provider:   Nicki Guadalajara, MD     Other Instructions Referral to AFIB CLINIC- they will contact you to be seen.

## 2022-07-12 NOTE — Progress Notes (Signed)
Patient ID: Mario Palmer, male   DOB: [birth year], 82 y.o.   MRN: [id number]        Primary M.D.: Dr. Velna Hatchet  HPI: Mario Palmer is a 82 y.o. male who presents for an 5 month follow-up cardiology and sleep evaluation after receiving a new CPAP device.  Mario Palmer has a history of hypertension, obstructive sleep apnea on CPAP therapy, GERD, and palpitations with atrial ectopy,. 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 He has history of hyperlipidemia and has been on simvastatin and had intermittently stopped this secondary to arthralgias.  An echo Doppler study in January 2015 revealed a normal systolic function with ejection fraction of 55-60%. There was evidence for mild aortic and mitral insufficiency. His left atrium was moderately dilated.  Mario Palmer was diagnosed with obstructive sleep apnea in 2010. He had mild sleep apnea with events worse in the supine position and during REM sleep. He has been on CPAP therapy since that time and initially he was started on an 8 cm water pressure but also this was later titrated to 10 cm.  He admits to 100% compliance.  His initial machine malfunctioned in 2017 and  He received a new AirSense 10 CPAP unit.  He has noticed significant improvement in this new machine.  Compared to his old one.  He admits to 100% compliance.  He goes to bed between 9:30 9:45 AM and wakes up once around 2:30 to the bathroom and typically sleeps 9-10 hours per night.  A download was obtained from his new machine from 05/07/2015 through 06/05/2015.  This revealed 100% compliance.  He is averaging 9 hours and 53 minutes per night.  He is set at a 10 cm water pressure.  His AHI was excellent at 1.1.  He uses a fullface mask.  There was no leak.    When I saw him in March 2018 his ECG demonstrated atrial fibrillation.  He was in this of questionable duration.  At that time, I initiated anticoagulation witheliquis  5 mg twice a day.  I discontinued  amlodipine and started Cardizem CD 180 mg.  He underwent an echo Doppler study on 06/23/2016 which showed an EF of 55-60%.  He did not have wall motion abnormalities.  There was mild aortic sclerosis with mildAR and mild focal calcification of the anterior mitral valve leaflet with trivial MR.  His right ventricle is mildly dilated.  His left atrium was severely dilated and measured 64 mm parasternally.  I initiated therapy with amiodarone as an antiarrhythmic agent, with the greatest likelihood for pharmacologic success.  I decrease Bystolic to 5 mg.  He started amiodarone at 200 mg first week and ultimately titrated this to 200 mg twice a day.  He has felt improved with control his ventricular rate.  He notices that his blood pressure is slightly increased on the reduced dose of Bystolic.  He admitted to 100% compliance with CPAP therapy.  He underwent successful DC cardioversion by me on 08/18/2016 with recurrence of sinus rhythm.  He has noticed improved energy since restoration of sinus rhythm.  He is unaware of any significant ectopy but does note a rare palpitation.  He has been exercising and pedals on a stationary bike for at least 40 minutes without chest pain or shortness of breath.  When I saw him in September 2018 he was unaware of recurrent atrial fibrillation.  He does admit to occasional palpitations at night.  Apparently, he had been  on Bystolic 10 mg but inadvertently when he called his primary physician Bystolic was renewed at 5 mg instead of the 10 mg dose.  The palpitations seem to have occurred on the reduced dose.  I last saw him in December and resume bystolic at 10 mg.  This has improved his palpitations and blood pressure.  He continues to use CPAP with 100% compliance.   A download from August 21 through 12/20/2016 revealed 100% of use with an average sleep duration of 9 hours and 11 minutes.  His AHI is excellent at 1.7 on a 10 cm set pressure.  His amiodarone dose was reduced in  December and he now is on 200 mg daily, in addition to Cartia 240 mg, lisinopril 40 mg in addition to spironolactone 25 mg daily for hypertension.  He called the office with complaints of his tongue becoming exceedingly dry.  After several hours of CPAP use.  He has a fullface mask.  He breathes through his mouth and remotely had used a nasal mask was switched to fullface mask due to oral venting.  He had complained of a very dry mouth, making it difficult for him to continue to sleep.  He was worked in and seen as an add-on one month ago.  At that time, we readjusted his medication.  We had subsequent telephone conversations with additional adjustment.  He went to his DME company and they adjusted his tube temperature to 70.    He underwent colonoscopy and endoscopy by Dr. Kinnie Scales and was found to have 9 polyps.  Apparently the biopsy was benign.  When I last saw him early April 2019 he was continuing to have difficulty with his new CPAP machine.  Subsequently he saw [facility] as his DME company.  He was given a chinstrap.  Addition, he was advised by his daughter-in-law's dentist to use Somnifix to assist his mouth breathing.  He feels since implementing this mouth taped, he has noticed marked improvement in his sleep.  He is now sleeping at least 9 hours.  His sleep is restorative.  He has noticed his blood pressure now is significantly more controlled.  He brought with him blood pressure readings from home and his systolic blood pressures have ranged from in the low to mid 90s to the 120s.  He has been unaware of any recurrent episodes of atrial fibrillation.  He denies episodes of chest pain.  He continues to be on anticoagulation for PAF.  He had follow-up laboratory in April which showed a total cholesterol 188 triglycerides 190 LDL 93 HDL 57.  PSA was increased at 6.2.  Creatinine was 1.32.  He had previously seen Dr. Velna Hatchet for primary care but apparently her office is no longer taking  [employer].    When I saw him in August 2019, he was maintaining sinus rhythm on amiodarone 200 mg daily in addition to Bystolic 5 mg.  He also was on lisinopril 40 mg in addition to spironolactone for hypertension.  He had been on low-dose simvastatin and particularly with its interaction with Cardizem and an LDL of 93 I suggested he discontinue simvastatin and started him on atorvastatin 20 mg.  He tells me he had retinal surgery in January.  He had undergone excision of a squamous cell cancer from his abdomen and was told of clear margins.  He fell on his face after he tripped on his shoelace resulting in significant ecchymoses to his nose and under his eyes bilaterally.  He continues to use CPAP.  During that evaluation, I recommended he discontinue aspirin but continue Eliquis.  He had undergone  laparoscopic double hernia surgery on November 28, 2018 by Dr. Magnus Ivan.  He subsequently required hospitalization for development of a postoperative ileus.  When I saw him in September 2020 he denied any chest pain or awareness of any abnormal rhythm.  He did have some intermittent leg cramping at night.  He continues to be compliant with CPAP therapy.  During that evaluation with his renal insufficiency I suggested slight reduction of spironolactone to 12.5 mg daily.  He continues to be compliant with CPAP therapy.  I saw him for follow-up evaluation in July 2021 and over the prior 10 months he had remained stable and denied chest pain, PND, orthopnea.  His pulse rate had been in the 50s.  During that evaluation, ECG revealed sinus bradycardia and I recommended he reduce his amiodarone from 200 mg daily to 100 mg alternating with 200 mg every other day.  He had continued to use CPAP therapy and a download revealed 100% compliance with an AHI of 1.2/h at 10 cm water pressure. He has continued to be on anticoagulation.    I saw him in October 2021 at which time he continued to feel well and  denied any orthostatic symptoms.  He continues to be on diltiazem 120 mg at bedtime, lisinopril 40 mg daily in addition to spironolactone 25 mg daily for blood pressure control.  He was on atorvastatin for lipid therapy.  He continued to use CPAP therapy.  A new download was obtained from September 18 through January 18, 2020 which confirmed 100% compliance with average use at 7 hours and 17 minutes.  At 10 cm water pressure AHI is 2.5/h.  He was tolerating low-dose Eliquis due to his age and creatinine most recently 1.58.  I  saw him on Aug 25, 2020 at which time he felt well.  He denied any chest pain, shortness of breath, or anginal symptoms.  He was unaware of any heart rate irregularity.  He continues to use CPAP with excellent compliance.  A download was obtained from March 15 through July 14, 2020 which shows 100% use with average use of 5 hours and 45 minutes.  His CPAP set up date was January 2017 and as result he has a 3G wireless system.  Unfortunately in mid April 2022 with [employer] upgrade, only 5G units are able to be continued to be downloadable wirelessly.  On his download at 10 cm set pressure AHI was 7.1.  He does not have a CPAP auto unit but has a ResMed air sense 10 CPAP device.  During that evaluation, his ECG showed sinus bradycardia at 57 bpm.  His machine was no longer while or less and he qualified for new machine.  In the interim I recommended changing his pressure setting from a set pressure of 10 to a set pressure of 20 cm of water since he did not have an auto mode CPAP unit.  I last saw him on March 03, 2022.  He continued to feel well and was walking 2 miles at least 4 days/week on concrete.  He denies any chest pain or shortness of breath.  His blood pressure was stable.  His ECG showed sinus bradycardia at 53 bpm with first-degree AV block and PR interval 218 ms.  He is still using his old CPAP machine.  He brought this with him today and we were able to  obtain a download off his  card.  At 12 cm, AHI is 1.0.  He typically goes to bed around 9:30 PM and wakes up around 7.  However CPAP usage was only 4 hours and 38 minutes per night.  He is followed by urology with elevated PSA.  He is unaware of any recurrent rhythm disturbance.  He qualified for a new machine, and an order was placed for him to receive a ResMed AirSense 11 AutoSet unit.  Since I last saw him, Mario Palmer received a new ResMed AirSense 11 AutoSet unit with set up date on May 19, 2022 and [employer] as his DME company.  A download was obtained from February 16 through June 17, 2022.  Compliance is excellent with 100% use.  Average use is 9 hours and 4 minutes.  At a pressure range of 11 to 16 cm of water, AHI was excellent at 1.41 events per hour.  95th percentile pressure was 13.9 with maximum average pressure at 15.1.  Presently, he feels well.  He is unaware of any rhythm disturbance.  He denies any shortness of breath or palpitations.  He presents for evaluation.  Past Medical History:  Diagnosis Date   Arthritis    Cancer (HCC)    skin cancer   Dysrhythmia    afib   GERD (gastroesophageal reflux disease)    High cholesterol    History of kidney stones    Hx of echocardiogram 12/2010   EF 55%, There is Stage 1 diastolic dyfunction. Normal LV filling pressure. Aortic sclerosis with mild AI, mild TR   Hypertension    Palpitation    with ecotopy   Sleep apnea     Past Surgical History:  Procedure Laterality Date   CARDIOVERSION N/A 09/12/2016   Procedure: CARDIOVERSION;  Surgeon: Lennette Bihari, MD;  Location: [facility] ENDOSCOPY;  Service: Cardiovascular;  Laterality: N/A;   EYE SURGERY Bilateral    cataract   INGUINAL HERNIA REPAIR Left 11/28/2018   Procedure: LAPAROSCOPIC  LEFT INGUINAL HERNIA WITH MESH;  Surgeon: Abigail Miyamoto, MD;  Location: [facility] OR;  Service: General;  Laterality: Left;   INGUINAL HERNIA REPAIR Right 02/10/2021   Procedure: HERNIA REPAIR RIGHT INGUINAL;  Surgeon: Abigail Miyamoto, MD;  Location: [facility] ORS;  Service: General;  Laterality: Right;   MOHS SURGERY Left    skin cancer remover from left side   RETINAL DETACHMENT SURGERY Right    RETINAL LASER PROCEDURE Right 04/2018   SKIN CANCER EXCISION  07/2018   TONSILLECTOMY     UMBILICAL HERNIA REPAIR N/A 11/28/2018   Procedure: OPEN UMBILICAL HERNIA REPAIR WITH MESH;  Surgeon: Abigail Miyamoto, MD;  Location: [facility] OR;  Service: General;  Laterality: N/A;    No Known Allergies  Current Outpatient Medications  Medication Sig Dispense Refill   amiodarone (PACERONE) 100 MG tablet Take 1 tablet (100 mg total) by mouth daily. 90 tablet 3   apixaban (ELIQUIS) 2.5 MG TABS tablet Take 1 tablet (2.5 mg total) by mouth 2 (two) times daily. Please call office for an appointment. 180 tablet 0   atorvastatin (LIPITOR) 20 MG tablet Take 1 tablet (20 mg total) by mouth daily. SCHEDULE OFFICE VISIT FOR FUTURE REFILLS. 30 tablet 0   CARTIA XT 120 MG 24 hr capsule TAKE ONE CAPSULE BY MOUTH DAILY AT BEDTIME 90 capsule 1   Cholecalciferol (VITAMIN D) 2000 UNITS tablet Take 2,000 Units by mouth daily.     Multiple Vitamins-Minerals (MULTIVITAMIN ADULT PO) Take 1 tablet by  mouth daily.     nebivolol (BYSTOLIC) 10 MG tablet Take 0.5 tablets (5 mg total) by mouth daily. 90 tablet 1   NON FORMULARY CPAP therapy     Saccharomyces boulardii (PROBIOTIC) 250 MG CAPS Take 1 capsule by mouth daily.     sodium chloride (OCEAN) 0.65 % SOLN nasal spray Place 1 spray into both nostrils as needed for congestion.     spironolactone (ALDACTONE) 25 MG tablet TAKE ONE TABLET BY MOUTH ONE TIME DAILY (Patient taking differently: 12.5 mg daily.) 30 tablet 2   tamsulosin (FLOMAX) 0.4 MG CAPS capsule Take 0.4 mg by mouth daily.     lisinopril (ZESTRIL) 40 MG tablet TAKE ONE TABLET BY MOUTH ONE TIME DAILY 90 tablet 3   traMADol (ULTRAM) 50 MG tablet Take 1 tablet (50 mg total) by mouth every 6 (six) hours as needed for moderate pain or severe pain. 20  tablet 0   No current facility-administered medications for this visit.    Social History   Socioeconomic History   Marital status: Married    Spouse name: Not on file   Number of children: Not on file   Years of education: Not on file   Highest education level: Not on file  Occupational History   Occupation: retired  Tobacco Use   Smoking status: Former    Types: Cigarettes   Smokeless tobacco: Never   Tobacco comments:    quit smoking in 1982  Vaping Use   Vaping Use: Never used  Substance and Sexual Activity   Alcohol use: No   Drug use: No   Sexual activity: Yes  Other Topics Concern   Not on file  Social History Narrative   Not on file   Social Determinants of Health   Financial Resource Strain: Low Risk  (06/22/2021)   Overall Financial Resource Strain (CARDIA)    Difficulty of Paying Living Expenses: Not hard at all  Food Insecurity: No Food Insecurity (06/22/2021)   Hunger Vital Sign    Worried About Running Out of Food in the Last Year: Never true    Ran Out of Food in the Last Year: Never true  Transportation Needs: No Transportation Needs (06/22/2021)   PRAPARE - Administrator, Civil Service (Medical): No    Lack of Transportation (Non-Medical): No  Physical Activity: Sufficiently Active (06/22/2021)   Exercise Vital Sign    Days of Exercise per Week: 6 days    Minutes of Exercise per Session: 90 min  Stress: No Stress Concern Present (06/22/2021)   [employer] of Occupational Health - Occupational Stress Questionnaire    Feeling of Stress : Not at all  Social Connections: Not on file  Intimate Partner Violence: Not At Risk (08/01/2018)   Humiliation, Afraid, Rape, and Kick questionnaire    Fear of Current or Ex-Partner: No    Emotionally Abused: No    Physically Abused: No    Sexually Abused: No    No family history on file.  Both parents are deceased.  Social history is notable in that he is married; 2 children and 2  grandchildren. 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 There is no tobacco or alcohol use.  ROS General: Negative; No fevers, chills, or night sweats;  HEENT: Negative; No changes in vision or hearing, sinus congestion, difficulty swallowing Pulmonary: Negative; No cough, wheezing, shortness of breath, hemoptysis Cardiovascular: No chest pain or recent palpitations GI: Negative; No nausea, vomiting, diarrhea, or abdominal pain  GU: PSA elevation, followed by urology  Musculoskeletal: Negative; no myalgias, joint pain, or weakness Hematologic/Oncology: Negative; no easy bruising, bleeding Endocrine: Negative; no heat/cold intolerance; no diabetes Neuro: Negative; no changes in balance, headaches Skin: Negative; No rashes or skin lesions Psychiatric: Negative; No behavioral problems, depression Sleep: Positive for obstructive sleep apnea on CPAP therapy.  Occasional leg cramps at night the urge to move without definitive restless leg syndrome; no snoring, daytime sleepiness, hypersomnolence, bruxism, hypnogognic hallucinations, no cataplexy Other comprehensive 14 point system review is negative.  PE: BP 132/68 (BP Location: Left Arm, Patient Position: Sitting, Cuff Size: Large)   Pulse (!) 53   Ht 5\' 10"  (1.778 m)   Wt 193 lb (87.5 kg)   SpO2 99%   BMI 27.69 kg/m    Repeat blood pressure by me was 114/70.  Wt Readings from Last 3 Encounters:  09/22/16 192 lb (87.1 kg)  09/12/16 198 lb (89.8 kg)  08/18/16 194 lb (88 kg)   General: Alert, oriented, no distress.  Skin: normal turgor, no rashes, warm and dry HEENT: Normocephalic, atraumatic. Pupils equal round and reactive to light; sclera anicteric; extraocular muscles intact;  Nose without nasal septal hypertrophy Mouth/Parynx benign; Mallinpatti scale 3 Neck: No JVD, no carotid bruits; normal carotid upstroke Lungs: clear to ausculatation and percussion; no wheezing or rales Chest wall: without tenderness to  palpitation Heart: PMI not displaced, RRR, s1 s2 normal, 1/6 systolic murmur, no diastolic murmur, no rubs, gallops, thrills, or heaves Abdomen: soft, nontender; no hepatosplenomehaly, BS+; abdominal aorta nontender and not dilated by palpation. Back: no CVA tenderness Pulses 2+ Musculoskeletal: full range of motion, normal strength, no joint deformities Extremities: no clubbing cyanosis or edema, Homan's sign negative  Neurologic: grossly nonfocal; Cranial nerves grossly wnl Psychologic: Normal mood and affect   April 10, 2024ECG (independently read by me): Atrial flutter at 67; QTc 435 msec   March 03, 2022 ECG (independently read by me): Sinus bradycardia at 53, 1st degree AV bl;ock; PR 218 msec  Aug 25, 2020 ECG (independently read by me): Sinus bradycardia at 57, borderline 1st degree AV block    October 2021 ECG (independently read by me): Sinus Bradycardia at 54; 1st degree AV block; PR 210 msec; QTc 440 msec  October 13, 2019 ECG (independently read by me): Sinus bradycardia at 51 bpm, first-degree AV block with PR interval 226 ms.Marland Kitchen  QTc interval 440 ms.  December 30, 2018 ECG (independently read by me): Sinus bradycardia at 55 bpm.  First-degree AV block with a.PR interval 230 msec.  Early transition.  March 2020ECG (independently read by me): Normal sinus rhythm at 62 bpm.  PR interval 200 ms.  QTc interval 444 ms.  No ectopy.  November 28, 2017 ECG (independently read by me): Sinus Bradycardia at 51, degree AV block.  No ectopy.  April 2019 ECG (independently read by me): Sinus bradycardia at 49 bpm with mild sinus arrhythmia.  First-degree AV block with a paratubal 2 to 2 ms.  No ST segment changes.  06/01/2017 ECG (independently read by me):Sinus bradycardia 58 bpm.  First-degree AV block.  05/11/2017 ECG (independently read by me): Sinus bradycardia at 67 bpm.  First degree AV block with a PR interval 214 ms.  QTC normal 434 ms.  December 2018 ECG (independently read by  me):  Normal sinus rhythm at 63 bpm. PR interval .  QTc nterval 466 ms. No significant ST-T changes.  September 2018 ECG (independently read by me): Sinus bradycardia 52 bpm.  QTC increased at 487 ms.  No CMV ST-T change.  June 2018 ECG (independently read by me): Normal sinus rhythm at 62 bpm.  Left axis deviation.  QTc interval 479 ms.  No significant ST-T changes  May 2018 ECG (independently read by me): atrial flib  /flutter with coarse laboratory wave.  07/13/2016 ECG (independently read by me): Atrial fibrillation with a ventricular rate in the 50s.  Mild RV conduction delay.  March 2018 ECG (independently read by me): Atrial fibrillation at 63 bpm.  QTc interval 427 ms.  March 2016 ECG (independently read by me): Sinus bradycardia 59 bpm.  Mild RV conduction delay.  Normal intervals.  March 2015 ECG: Normal sinus rhythm at 60 beats per minute. No ectopy. Nonspecific T changes in lead 3.  02/04/2013 ECG: Sinus bradycardia with PACs.  In the office today I obtained a new download from October 29, 2017 through November 27, 2017.  During this time he has been using some to fix on a daily basis.  Compliance is 100%.  He is averaging 9 hours and 19 minutes of sleep per night.  At a 10 cm set pressure, AHI is excellent at 1.2.  There is no leak.  LABS:     Latest Ref Rng & Units 12/14/2021   11:09 AM 06/08/2021   10:49 AM 02/03/2021   10:12 AM  BMP  Glucose 70 - 99 mg/dL 84  77  71   BUN 8 - 27 mg/dL 28  27  30    Creatinine 0.76 - 1.27 mg/dL 1.61  0.96  0.45   BUN/Creat Ratio 10 - 24 19  18     Sodium 134 - 144 mmol/L 142  141  138   Potassium 3.5 - 5.2 mmol/L 5.0  5.3  4.9   Chloride 96 - 106 mmol/L 106  104  107   CO2 20 - 29 mmol/L 22  23  27    Calcium 8.6 - 10.2 mg/dL 9.9  40.9  9.5        Latest Ref Rng & Units 12/14/2021   11:09 AM 06/08/2021   10:49 AM 08/25/2020    4:09 PM  Hepatic Function  Total Protein 6.0 - 8.5 g/dL 6.7  6.8  6.7   Albumin 3.7 - 4.7 g/dL 4.2  4.5  4.4    AST 0 - 40 IU/L 18  21  20    ALT 0 - 44 IU/L 16  22  22    Alk Phosphatase 44 - 121 IU/L 70  70  65   Total Bilirubin 0.0 - 1.2 mg/dL 0.6  0.8  0.7       Latest Ref Rng & Units 12/14/2021   11:09 AM 06/08/2021   10:49 AM 02/03/2021   10:12 AM  CBC  WBC 3.4 - 10.8 x10E3/uL 7.9  6.5  6.7   Hemoglobin 13.0 - 17.7 g/dL 81.1  91.4  78.2   Hematocrit 37.5 - 51.0 % 40.6  41.2  38.4   Platelets 150 - 450 x10E3/uL 215  220  218    Lab Results  Component Value Date   MCV 93 12/14/2021   MCV 93 06/08/2021   MCV 96.7 02/03/2021   Lab Results  Component Value Date   TSH 1.990 08/25/2020     BNP No results found for: "PROBNP"  Lipid Panel     Component Value Date/Time   CHOL 149 12/14/2021 1109   TRIG 115 12/14/2021 1109   HDL 56 12/14/2021 1109   CHOLHDL  2.7 12/14/2021 1109   CHOLHDL 3.0 06/07/2016 1030   VLDL 28 06/07/2016 1030   LDLCALC 72 12/14/2021 1109    RADIOLOGY: No results found.  IMPRESSION:  1. Essential hypertension   2. OSA on CPAP   3. Paroxysmal atrial fibrillation (HCC)   4. Elevated PSA   5. Anticoagulated   6. Mixed hyperlipidemia     ASSESSMENT AND PLAN: Mario Palmer is a young appearing 82 year-old  gentleman who has a history of hypertension, obstructive sleep apnea, palpitations with remote atrial flutter/atrial fibrillation and hyperlipidemia.  He developed atrial fibrillation/flutter and after titration of amiodarone underwent successful DC cardioversion on 08/18/2016 with resolution of atrial flutter and restoration of sinus rhythm.  He has been on chronic eliquis for anticoagulation and denies bleeding.  Over the years his dose of amiodarone has slightly been reduced and when seen in May 2022 he was changed to 100 mg alternating with 200 mg.  Recently, he has been on amiodarone 100 mg daily and reduced dose Eliquis to 2.5 mg twice a day due to his age greater than 80 and creatinine at 1.51.  Presently, he is unaware of any rhythm  disturbance.  However, his ECG today verifies that he is back in atrial flutter with controlled ventricular rate with heart rate at 67 bpm.  He is unaware of duration of his atrial flutter.  He has been on amiodarone 100 mg daily.  He is fasting today and I will check laboratory with a comprehensive metabolic panel, magnesium level, CBC, TSH, lipid studies, and with his history of significant prior PSA elevation most recently at 11.1 on December 14, 2021 I will recheck this as well and have recommended follow-up urologic evaluation.  With his recurrent atrial flutter, I am recommending he undergo an echo Doppler evaluation.  I will further titrate his amiodarone and have recommended for the next week he increase this to 200 mg twice a day and then decrease to 200 mg daily.  Hopefully the increased amiodarone will convert him to sinus rhythm.  I will also refer him to atrial fibrillation clinic.  He does not pharmacologically cardiovert he most likely will require another cardioversion.  With his atrial flutter he may also be a candidate for atrial flutter ablation.  Laboratory done today will allow us to monitor amiodarone therapy.  Blood pressure today is stable on his current regimen of Cartia XT 120 mg, lisinopril 40 mg, nebivolol 5 mg and spironolactone 25 mg daily.  He continues to be on low-dose apixaban 2.5 mg twice a day with most recent creatinine at 1.51 in September 2023.  He has seen urology in the past for his elevated PSA.  In December 2019 PSA was 7.6, in May 2021 PSA was 8.6, and in September 2023 PSA was 11.1.  With reference to his obstructive sleep apnea, notices considerable improvement with his new [employer]Med AirSense 11 AutoSet unit.  Compliance is excellent.  AHI is excellent at 1.7 with his pressure range at 11 to 16 cm with 95th percentile pressure at 14.7 and maximum average pressure 15.6 events per hour.  He continues to be on atorvastatin 20 mg for hyperlipidemia.  I will see him in 3 months  for follow-up evaluation or sooner as needed.    Lennette Biharihomas A. Jerika Wales, MD, [facility] Carbon CountyFACC, ABSM Diplomate, [employer]   03/11/2022 11:52 AM

## 2022-07-13 DIAGNOSIS — G4733 Obstructive sleep apnea (adult) (pediatric): Secondary | ICD-10-CM | POA: Diagnosis not present

## 2022-07-13 LAB — COMPREHENSIVE METABOLIC PANEL
AST: 21 IU/L (ref 0–40)
Albumin: 4.4 g/dL (ref 3.7–4.7)
BUN/Creatinine Ratio: 17 (ref 10–24)
Bilirubin Total: 0.8 mg/dL (ref 0.0–1.2)
Calcium: 10.1 mg/dL (ref 8.6–10.2)
Glucose: 93 mg/dL (ref 70–99)
Potassium: 5 mmol/L (ref 3.5–5.2)
Total Protein: 7 g/dL (ref 6.0–8.5)

## 2022-07-13 LAB — CBC
Hematocrit: 45.1 % (ref 37.5–51.0)
Hemoglobin: 15.1 g/dL (ref 13.0–17.7)
MCH: 31.9 pg (ref 26.6–33.0)
MCV: 95 fL (ref 79–97)
RBC: 4.73 x10E6/uL (ref 4.14–5.80)
WBC: 7.7 10*3/uL (ref 3.4–10.8)

## 2022-07-13 LAB — TSH: TSH: 2.7 u[IU]/mL (ref 0.450–4.500)

## 2022-07-13 LAB — LIPID PANEL
Cholesterol, Total: 156 mg/dL (ref 100–199)
LDL Chol Calc (NIH): 81 mg/dL (ref 0–99)

## 2022-07-13 LAB — MAGNESIUM: Magnesium: 2 mg/dL (ref 1.6–2.3)

## 2022-07-18 DIAGNOSIS — G4733 Obstructive sleep apnea (adult) (pediatric): Secondary | ICD-10-CM | POA: Diagnosis not present

## 2022-07-21 ENCOUNTER — Telehealth: Payer: Self-pay | Admitting: Cardiovascular Disease

## 2022-07-21 NOTE — Telephone Encounter (Signed)
Attempted to call patient, unable to reach or leave message at this time. Will leave in triage for follow up    Lennette Bihari, MD 07/21/2022  4:25 AM EDT     Stage IIIa CKD with creatinine 1.51 and estimated GFR at 46.  PSA increased to 12.1 follow-up with his urologist.  CBC stable.  Magnesium normal.  TSH normal.  Lipid studies with total cholesterol 156, triglycerides 88, LDL 81.

## 2022-07-21 NOTE — Telephone Encounter (Signed)
Patient is returning call to discuss lab results. 

## 2022-07-25 ENCOUNTER — Telehealth: Payer: Self-pay | Admitting: Cardiovascular Disease

## 2022-07-25 NOTE — Telephone Encounter (Signed)
Results mailed out today per pt's request. See chart.

## 2022-07-25 NOTE — Telephone Encounter (Signed)
Called provided number. Received a busy signal. Print out of labs placed in outgoing mail.

## 2022-07-25 NOTE — Telephone Encounter (Signed)
Pt would like a copy of his recent lab results mailed over to him

## 2022-08-11 ENCOUNTER — Ambulatory Visit (HOSPITAL_COMMUNITY): Payer: Medicare Other | Attending: Cardiovascular Disease

## 2022-08-11 DIAGNOSIS — I4892 Unspecified atrial flutter: Secondary | ICD-10-CM | POA: Insufficient documentation

## 2022-08-11 LAB — ECHOCARDIOGRAM COMPLETE: S' Lateral: 3.3 cm

## 2022-08-17 DIAGNOSIS — G4733 Obstructive sleep apnea (adult) (pediatric): Secondary | ICD-10-CM | POA: Diagnosis not present

## 2022-09-17 DIAGNOSIS — G4733 Obstructive sleep apnea (adult) (pediatric): Secondary | ICD-10-CM | POA: Diagnosis not present

## 2022-09-18 ENCOUNTER — Encounter (INDEPENDENT_AMBULATORY_CARE_PROVIDER_SITE_OTHER): Payer: Medicare Other | Admitting: Ophthalmology

## 2022-09-18 DIAGNOSIS — H353112 Nonexudative age-related macular degeneration, right eye, intermediate dry stage: Secondary | ICD-10-CM | POA: Diagnosis not present

## 2022-09-18 DIAGNOSIS — H35373 Puckering of macula, bilateral: Secondary | ICD-10-CM | POA: Diagnosis not present

## 2022-10-09 ENCOUNTER — Other Ambulatory Visit: Payer: Self-pay | Admitting: Cardiovascular Disease

## 2022-10-09 DIAGNOSIS — I480 Paroxysmal atrial fibrillation: Secondary | ICD-10-CM

## 2022-10-09 NOTE — Telephone Encounter (Signed)
Prescription refill request for Eliquis received. Indication:afib Last office visit:4/24 Scr:1.51  4/24 Age: 82 Weight:86.8  kg  Prescription refilled

## 2022-10-12 DIAGNOSIS — G4733 Obstructive sleep apnea (adult) (pediatric): Secondary | ICD-10-CM | POA: Diagnosis not present

## 2022-10-17 DIAGNOSIS — G4733 Obstructive sleep apnea (adult) (pediatric): Secondary | ICD-10-CM | POA: Diagnosis not present

## 2022-11-07 DIAGNOSIS — R350 Frequency of micturition: Secondary | ICD-10-CM | POA: Diagnosis not present

## 2022-11-17 DIAGNOSIS — G4733 Obstructive sleep apnea (adult) (pediatric): Secondary | ICD-10-CM | POA: Diagnosis not present

## 2022-11-20 ENCOUNTER — Telehealth (HOSPITAL_COMMUNITY): Payer: Self-pay

## 2022-11-20 ENCOUNTER — Ambulatory Visit: Payer: Medicare Other | Attending: Cardiovascular Disease | Admitting: Cardiovascular Disease

## 2022-11-20 VITALS — BP 122/76 | HR 63 | Ht 70.0 in | Wt 192.2 lb

## 2022-11-20 DIAGNOSIS — R972 Elevated prostate specific antigen [PSA]: Secondary | ICD-10-CM

## 2022-11-20 DIAGNOSIS — I10 Essential (primary) hypertension: Secondary | ICD-10-CM | POA: Diagnosis not present

## 2022-11-20 DIAGNOSIS — G4733 Obstructive sleep apnea (adult) (pediatric): Secondary | ICD-10-CM | POA: Diagnosis not present

## 2022-11-20 DIAGNOSIS — Z7901 Long term (current) use of anticoagulants: Secondary | ICD-10-CM

## 2022-11-20 DIAGNOSIS — E782 Mixed hyperlipidemia: Secondary | ICD-10-CM

## 2022-11-20 DIAGNOSIS — I4892 Unspecified atrial flutter: Secondary | ICD-10-CM | POA: Diagnosis not present

## 2022-11-20 DIAGNOSIS — H9193 Unspecified hearing loss, bilateral: Secondary | ICD-10-CM | POA: Diagnosis not present

## 2022-11-20 MED ORDER — AMIODARONE HCL 200 MG PO TABS: 200.0000 mg | ORAL_TABLET | Freq: Two times a day (BID) | ORAL | 3 refills | Status: DC

## 2022-11-20 NOTE — Progress Notes (Unsigned)
Patient ID: Mario Palmer, male   DOB: [birth year], 82 y.o.   MRN: [id number]        Primary M.D.: Dr. Velna Hatchet  HPI: Mario Palmer is a 82 y.o. male who presents for an 5 month follow-up cardiology and sleep evaluation after receiving a new CPAP device.  Mario Palmer has a history of hypertension, obstructive sleep apnea on CPAP therapy, GERD, and palpitations with atrial ectopy,. 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 He has history of hyperlipidemia and has been on simvastatin and had intermittently stopped this secondary to arthralgias.  An echo Doppler study in January 2015 revealed a normal systolic function with ejection fraction of 55-60%. There was evidence for mild aortic and mitral insufficiency. His left atrium was moderately dilated.  Mario Palmer was diagnosed with obstructive sleep apnea in 2010. He had mild sleep apnea with events worse in the supine position and during REM sleep. He has been on CPAP therapy since that time and initially he was started on an 8 cm water pressure but also this was later titrated to 10 cm.  He admits to 100% compliance.  His initial machine malfunctioned in 2017 and  He received a new AirSense 10 CPAP unit.  He has noticed significant improvement in this new machine.  Compared to his old one.  He admits to 100% compliance.  He goes to bed between 9:30 9:45 AM and wakes up once around 2:30 to the bathroom and typically sleeps 9-10 hours per night.  A download was obtained from his new machine from 05/07/2015 through 06/05/2015.  This revealed 100% compliance.  He is averaging 9 hours and 53 minutes per night.  He is set at a 10 cm water pressure.  His AHI was excellent at 1.1.  He uses a fullface mask.  There was no leak.    When I saw him in March 2018 his ECG demonstrated atrial fibrillation.  He was in this of questionable duration.  At that time, I initiated anticoagulation witheliquis  5 mg twice a day.  I discontinued  amlodipine and started Cardizem CD 180 mg.  He underwent an echo Doppler study on 06/23/2016 which showed an EF of 55-60%.  He did not have wall motion abnormalities.  There was mild aortic sclerosis with mildAR and mild focal calcification of the anterior mitral valve leaflet with trivial MR.  His right ventricle is mildly dilated.  His left atrium was severely dilated and measured 64 mm parasternally.  I initiated therapy with amiodarone as an antiarrhythmic agent, with the greatest likelihood for pharmacologic success.  I decrease Bystolic to 5 mg.  He started amiodarone at 200 mg first week and ultimately titrated this to 200 mg twice a day.  He has felt improved with control his ventricular rate.  He notices that his blood pressure is slightly increased on the reduced dose of Bystolic.  He admitted to 100% compliance with CPAP therapy.  He underwent successful DC cardioversion by me on 08/18/2016 with recurrence of sinus rhythm.  He has noticed improved energy since restoration of sinus rhythm.  He is unaware of any significant ectopy but does note a rare palpitation.  He has been exercising and pedals on a stationary bike for at least 40 minutes without chest pain or shortness of breath.  When I saw him in September 2018 he was unaware of recurrent atrial fibrillation.  He does admit to occasional palpitations at night.  Apparently, he had been  on Bystolic 10 mg but inadvertently when he called his primary physician Bystolic was renewed at 5 mg instead of the 10 mg dose.  The palpitations seem to have occurred on the reduced dose.  I last saw him in December and resume bystolic at 10 mg.  This has improved his palpitations and blood pressure.  He continues to use CPAP with 100% compliance.   A download from August 21 through 12/20/2016 revealed 100% of use with an average sleep duration of 9 hours and 11 minutes.  His AHI is excellent at 1.7 on a 10 cm set pressure.  His amiodarone dose was reduced in  December and he now is on 200 mg daily, in addition to Cartia 240 mg, lisinopril 40 mg in addition to spironolactone 25 mg daily for hypertension.  He called the office with complaints of his tongue becoming exceedingly dry.  After several hours of CPAP use.  He has a fullface mask.  He breathes through his mouth and remotely had used a nasal mask was switched to fullface mask due to oral venting.  He had complained of a very dry mouth, making it difficult for him to continue to sleep.  He was worked in and seen as an add-on one month ago.  At that time, we readjusted his medication.  We had subsequent telephone conversations with additional adjustment.  He went to his DME company and they adjusted his tube temperature to 70.    He underwent colonoscopy and endoscopy by Dr. Kinnie Scales and was found to have 9 polyps.  Apparently the biopsy was benign.  When I last saw him early April 2019 he was continuing to have difficulty with his new CPAP machine.  Subsequently he saw [facility] as his DME company.  He was given a chinstrap.  Addition, he was advised by his daughter-in-law's dentist to use Somnifix to assist his mouth breathing.  He feels since implementing this mouth taped, he has noticed marked improvement in his sleep.  He is now sleeping at least 9 hours.  His sleep is restorative.  He has noticed his blood pressure now is significantly more controlled.  He brought with him blood pressure readings from home and his systolic blood pressures have ranged from in the low to mid 90s to the 120s.  He has been unaware of any recurrent episodes of atrial fibrillation.  He denies episodes of chest pain.  He continues to be on anticoagulation for PAF.  He had follow-up laboratory in April which showed a total cholesterol 188 triglycerides 190 LDL 93 HDL 57.  PSA was increased at 6.2.  Creatinine was 1.32.  He had previously seen Dr. Velna Hatchet for primary care but apparently her office is no longer taking  [employer].    When I saw him in August 2019, he was maintaining sinus rhythm on amiodarone 200 mg daily in addition to Bystolic 5 mg.  He also was on lisinopril 40 mg in addition to spironolactone for hypertension.  He had been on low-dose simvastatin and particularly with its interaction with Cardizem and an LDL of 93 I suggested he discontinue simvastatin and started him on atorvastatin 20 mg.  He tells me he had retinal surgery in January.  He had undergone excision of a squamous cell cancer from his abdomen and was told of clear margins.  He fell on his face after he tripped on his shoelace resulting in significant ecchymoses to his nose and under his eyes bilaterally.  He continues to use CPAP.  During that evaluation, I recommended he discontinue aspirin but continue Eliquis.  He had undergone  laparoscopic double hernia surgery on November 28, 2018 by Dr. Magnus Ivan.  He subsequently required hospitalization for development of a postoperative ileus.  When I saw him in September 2020 he denied any chest pain or awareness of any abnormal rhythm.  He did have some intermittent leg cramping at night.  He continues to be compliant with CPAP therapy.  During that evaluation with his renal insufficiency I suggested slight reduction of spironolactone to 12.5 mg daily.  He continues to be compliant with CPAP therapy.  I saw him for follow-up evaluation in July 2021 and over the prior 10 months he had remained stable and denied chest pain, PND, orthopnea.  His pulse rate had been in the 50s.  During that evaluation, ECG revealed sinus bradycardia and I recommended he reduce his amiodarone from 200 mg daily to 100 mg alternating with 200 mg every other day.  He had continued to use CPAP therapy and a download revealed 100% compliance with an AHI of 1.2/h at 10 cm water pressure. He has continued to be on anticoagulation.    I saw him in October 2021 at which time he continued to feel well and  denied any orthostatic symptoms.  He continues to be on diltiazem 120 mg at bedtime, lisinopril 40 mg daily in addition to spironolactone 25 mg daily for blood pressure control.  He was on atorvastatin for lipid therapy.  He continued to use CPAP therapy.  A new download was obtained from September 18 through January 18, 2020 which confirmed 100% compliance with average use at 7 hours and 17 minutes.  At 10 cm water pressure AHI is 2.5/h.  He was tolerating low-dose Eliquis due to his age and creatinine most recently 1.58.  I  saw him on Aug 25, 2020 at which time he felt well.  He denied any chest pain, shortness of breath, or anginal symptoms.  He was unaware of any heart rate irregularity.  He continues to use CPAP with excellent compliance.  A download was obtained from March 15 through July 14, 2020 which shows 100% use with average use of 5 hours and 45 minutes.  His CPAP set up date was January 2017 and as result he has a 3G wireless system.  Unfortunately in mid April 2022 with [employer] upgrade, only 5G units are able to be continued to be downloadable wirelessly.  On his download at 10 cm set pressure AHI was 7.1.  He does not have a CPAP auto unit but has a ResMed air sense 10 CPAP device.  During that evaluation, his ECG showed sinus bradycardia at 57 bpm.  His machine was no longer while or less and he qualified for new machine.  In the interim I recommended changing his pressure setting from a set pressure of 10 to a set pressure of 20 cm of water since he did not have an auto mode CPAP unit.  I last saw him on March 03, 2022.  He continued to feel well and was walking 2 miles at least 4 days/week on concrete.  He denies any chest pain or shortness of breath.  His blood pressure was stable.  His ECG showed sinus bradycardia at 53 bpm with first-degree AV block and PR interval 218 ms.  He is still using his old CPAP machine.  He brought this with him today and we were able to  obtain a download off his  card.  At 12 cm, AHI is 1.0.  He typically goes to bed around 9:30 PM and wakes up around 7.  However CPAP usage was only 4 hours and 38 minutes per night.  He is followed by urology with elevated PSA.  He is unaware of any recurrent rhythm disturbance.  He qualified for a new machine, and an order was placed for him to receive a ResMed AirSense 11 AutoSet unit.  Since I last saw him, Mario Palmer received a new ResMed AirSense 11 AutoSet unit with set up date on May 19, 2022 and [employer] as his DME company.  A download was obtained from February 16 through June 17, 2022.  Compliance is excellent with 100% use.  Average use is 9 hours and 4 minutes.  At a pressure range of 11 to 16 cm of water, AHI was excellent at 1.41 events per hour.  95th percentile pressure was 13.9 with maximum average pressure at 15.1.  Presently, he feels well.  He is unaware of any rhythm disturbance.  He denies any shortness of breath or palpitations.  He presents for evaluation.  Past Medical History:  Diagnosis Date   Arthritis    Cancer (HCC)    skin cancer   Dysrhythmia    afib   GERD (gastroesophageal reflux disease)    High cholesterol    History of kidney stones    Hx of echocardiogram 12/2010   EF 55%, There is Stage 1 diastolic dyfunction. Normal LV filling pressure. Aortic sclerosis with mild AI, mild TR   Hypertension    Palpitation    with ecotopy   Sleep apnea     Past Surgical History:  Procedure Laterality Date   CARDIOVERSION N/A 09/12/2016   Procedure: CARDIOVERSION;  Surgeon: Lennette Bihari, MD;  Location: [facility] ENDOSCOPY;  Service: Cardiovascular;  Laterality: N/A;   EYE SURGERY Bilateral    cataract   INGUINAL HERNIA REPAIR Left 11/28/2018   Procedure: LAPAROSCOPIC  LEFT INGUINAL HERNIA WITH MESH;  Surgeon: Abigail Miyamoto, MD;  Location: [facility] OR;  Service: General;  Laterality: Left;   INGUINAL HERNIA REPAIR Right 02/10/2021   Procedure: HERNIA REPAIR RIGHT INGUINAL;  Surgeon: Abigail Miyamoto, MD;  Location: [facility] ORS;  Service: General;  Laterality: Right;   MOHS SURGERY Left    skin cancer remover from left side   RETINAL DETACHMENT SURGERY Right    RETINAL LASER PROCEDURE Right 04/2018   SKIN CANCER EXCISION  07/2018   TONSILLECTOMY     UMBILICAL HERNIA REPAIR N/A 11/28/2018   Procedure: OPEN UMBILICAL HERNIA REPAIR WITH MESH;  Surgeon: Abigail Miyamoto, MD;  Location: [facility] OR;  Service: General;  Laterality: N/A;    No Known Allergies  Current Outpatient Medications  Medication Sig Dispense Refill   amiodarone (PACERONE) 200 MG tablet Take 1 tablet (200 mg) by mouth twice daily for 1 week, then decrease to 1 tablet (200 mg) by mouth daily. 180 tablet 3   apixaban (ELIQUIS) 2.5 MG TABS tablet TAKE 1 TABLET TWICE A DAY 60 tablet 5   atorvastatin (LIPITOR) 20 MG tablet TAKE ONE TABLET BY MOUTH ONE TIME DAILY 90 tablet 3   Cholecalciferol (VITAMIN D) 2000 UNITS tablet Take 2,000 Units by mouth daily.     diltiazem (CARTIA XT) 120 MG 24 hr capsule TAKE ONE CAPSULE BY MOUTH DAILY AT BEDTIME 90 capsule 3   lisinopril (ZESTRIL) 40 MG tablet TAKE ONE TABLET BY MOUTH ONE TIME DAILY 90  tablet 3   Multiple Vitamins-Minerals (MULTIVITAMIN ADULT PO) Take 1 tablet by mouth daily.     nebivolol (BYSTOLIC) 10 MG tablet Take 0.5 tablets (5 mg total) by mouth daily. 90 tablet 3   NON FORMULARY CPAP therapy     Saccharomyces boulardii (PROBIOTIC) 250 MG CAPS Take 1 capsule by mouth daily.     sodium chloride (OCEAN) 0.65 % SOLN nasal spray Place 1 spray into both nostrils as needed for congestion.     spironolactone (ALDACTONE) 25 MG tablet TAKE 1 TABLET ONCE DAILY 30 tablet 2   tamsulosin (FLOMAX) 0.4 MG CAPS capsule Take 0.4 mg by mouth daily.     traMADol (ULTRAM) 50 MG tablet Take 1 tablet (50 mg total) by mouth every 6 (six) hours as needed for moderate pain or severe pain. 20 tablet 0   No current facility-administered medications for this visit.    Social History    Socioeconomic History   Marital status: Married    Spouse name: Not on file   Number of children: Not on file   Years of education: Not on file   Highest education level: Not on file  Occupational History   Occupation: retired  Tobacco Use   Smoking status: Former    Types: Cigarettes   Smokeless tobacco: Never   Tobacco comments:    quit smoking in 1982  Vaping Use   Vaping status: Never Used  Substance and Sexual Activity   Alcohol use: No   Drug use: No   Sexual activity: Yes  Other Topics Concern   Not on file  Social History Narrative   Not on file   Social Determinants of Health   Financial Resource Strain: Low Risk  (06/22/2021)   Overall Financial Resource Strain (CARDIA)    Difficulty of Paying Living Expenses: Not hard at all  Food Insecurity: No Food Insecurity (06/22/2021)   Hunger Vital Sign    Worried About Running Out of Food in the Last Year: Never true    Ran Out of Food in the Last Year: Never true  Transportation Needs: No Transportation Needs (06/22/2021)   PRAPARE - Administrator, Civil Service (Medical): No    Lack of Transportation (Non-Medical): No  Physical Activity: Sufficiently Active (06/22/2021)   Exercise Vital Sign    Days of Exercise per Week: 6 days    Minutes of Exercise per Session: 90 min  Stress: No Stress Concern Present (06/22/2021)   [employer] of Occupational Health - Occupational Stress Questionnaire    Feeling of Stress : Not at all  Social Connections: Not on file  Intimate Partner Violence: Not At Risk (08/01/2018)   Humiliation, Afraid, Rape, and Kick questionnaire    Fear of Current or Ex-Partner: No    Emotionally Abused: No    Physically Abused: No    Sexually Abused: No    No family history on file.  Both parents are deceased.  Social history is notable in that he is married; 2 children and 2 grandchildren. 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 There is no tobacco or alcohol  use.  ROS General: Negative; No fevers, chills, or night sweats;  HEENT: Negative; No changes in vision or hearing, sinus congestion, difficulty swallowing Pulmonary: Negative; No cough, wheezing, shortness of breath, hemoptysis Cardiovascular: No chest pain or recent palpitations GI: Negative; No nausea, vomiting, diarrhea, or abdominal pain GU: PSA elevation, followed by urology  Musculoskeletal: Negative; no myalgias, joint pain, or  weakness Hematologic/Oncology: Negative; no easy bruising, bleeding Endocrine: Negative; no heat/cold intolerance; no diabetes Neuro: Negative; no changes in balance, headaches Skin: Negative; No rashes or skin lesions Psychiatric: Negative; No behavioral problems, depression Sleep: Positive for obstructive sleep apnea on CPAP therapy.  Occasional leg cramps at night the urge to move without definitive restless leg syndrome; no snoring, daytime sleepiness, hypersomnolence, bruxism, hypnogognic hallucinations, no cataplexy Other comprehensive 14 point system review is negative.  PE: BP 122/76 (BP Location: Left Arm, Patient Position: Sitting, Cuff Size: Normal)   Pulse 63   Ht 5\' 10"  (1.778 m)   Wt 192 lb 3.2 oz (87.2 kg)   SpO2 99%   BMI 27.58 kg/m    Repeat blood pressure by me was 114/70.  Wt Readings from Last 3 Encounters:  09/22/16 192 lb (87.1 kg)  09/12/16 198 lb (89.8 kg)  08/18/16 194 lb (88 kg)   General: Alert, oriented, no distress.  Skin: normal turgor, no rashes, warm and dry HEENT: Normocephalic, atraumatic. Pupils equal round and reactive to light; sclera anicteric; extraocular muscles intact;  Nose without nasal septal hypertrophy Mouth/Parynx benign; Mallinpatti scale 3 Neck: No JVD, no carotid bruits; normal carotid upstroke Lungs: clear to ausculatation and percussion; no wheezing or rales Chest wall: without tenderness to palpitation Heart: PMI not displaced, RRR, s1 s2 normal, 1/6 systolic murmur, no diastolic murmur, no  rubs, gallops, thrills, or heaves Abdomen: soft, nontender; no hepatosplenomehaly, BS+; abdominal aorta nontender and not dilated by palpation. Back: no CVA tenderness Pulses 2+ Musculoskeletal: full range of motion, normal strength, no joint deformities Extremities: no clubbing cyanosis or edema, Homan's sign negative  Neurologic: grossly nonfocal; Cranial nerves grossly wnl Psychologic: Normal mood and affect  EKG Interpretation Date/Time:  Monday November 20 2022 10:54:30 EDT Ventricular Rate:  61 PR Interval:    QRS Duration:  98 QT Interval:  452 QTC Calculation: 455 R Axis:   -7  Text Interpretation: Atrial flutter with variable A-V block Incomplete right bundle branch block When compared with ECG of 20-Jun-2018 04:39, PREVIOUS ECG IS PRESENT Confirmed by Nicki Guadalajara ([id number]) on 11/20/2022 10:57:02 AM    April 10, 2024ECG (independently read by me): Atrial flutter at 67; QTc 435 msec   March 03, 2022 ECG (independently read by me): Sinus bradycardia at 53, 1st degree AV bl;ock; PR 218 msec  Aug 25, 2020 ECG (independently read by me): Sinus bradycardia at 57, borderline 1st degree AV block    October 2021 ECG (independently read by me): Sinus Bradycardia at 54; 1st degree AV block; PR 210 msec; QTc 440 msec  October 13, 2019 ECG (independently read by me): Sinus bradycardia at 51 bpm, first-degree AV block with PR interval 226 ms.Marland Kitchen  QTc interval 440 ms.  December 30, 2018 ECG (independently read by me): Sinus bradycardia at 55 bpm.  First-degree AV block with a.PR interval 230 msec.  Early transition.  March 2020ECG (independently read by me): Normal sinus rhythm at 62 bpm.  PR interval 200 ms.  QTc interval 444 ms.  No ectopy.  November 28, 2017 ECG (independently read by me): Sinus Bradycardia at 51, degree AV block.  No ectopy.  April 2019 ECG (independently read by me): Sinus bradycardia at 49 bpm with mild sinus arrhythmia.  First-degree AV block with a paratubal 2 to 2 ms.   No ST segment changes.  06/01/2017 ECG (independently read by me):Sinus bradycardia 58 bpm.  First-degree AV block.  05/11/2017 ECG (independently read by me): Sinus bradycardia at  67 bpm.  First degree AV block with a PR interval 214 ms.  QTC normal 434 ms.  December 2018 ECG (independently read by me):  Normal sinus rhythm at 63 bpm. PR interval .  QTc nterval 466 ms. No significant ST-T changes.  September 2018 ECG (independently read by me): Sinus bradycardia 52 bpm.  QTC increased at 487 ms.  No CMV ST-T change.  June 2018 ECG (independently read by me): Normal sinus rhythm at 62 bpm.  Left axis deviation.  QTc interval 479 ms.  No significant ST-T changes  May 2018 ECG (independently read by me): atrial flib  /flutter with coarse laboratory wave.  07/13/2016 ECG (independently read by me): Atrial fibrillation with a ventricular rate in the 50s.  Mild RV conduction delay.  March 2018 ECG (independently read by me): Atrial fibrillation at 63 bpm.  QTc interval 427 ms.  March 2016 ECG (independently read by me): Sinus bradycardia 59 bpm.  Mild RV conduction delay.  Normal intervals.  March 2015 ECG: Normal sinus rhythm at 60 beats per minute. No ectopy. Nonspecific T changes in lead 3.  02/04/2013 ECG: Sinus bradycardia with PACs.  In the office today I obtained a new download from October 29, 2017 through November 27, 2017.  During this time he has been using some to fix on a daily basis.  Compliance is 100%.  He is averaging 9 hours and 19 minutes of sleep per night.  At a 10 cm set pressure, AHI is excellent at 1.2.  There is no leak.  LABS:     Latest Ref Rng & Units 07/12/2022   10:27 AM 12/14/2021   11:09 AM 06/08/2021   10:49 AM  BMP  Glucose 70 - 99 mg/dL 93  84  77   BUN 8 - 27 mg/dL 25  28  27    Creatinine 0.76 - 1.27 mg/dL 0.10  2.72  5.36   BUN/Creat Ratio 10 - 24 17  19  18    Sodium 134 - 144 mmol/L 144  142  141   Potassium 3.5 - 5.2 mmol/L 5.0  5.0  5.3   Chloride  96 - 106 mmol/L 105  106  104   CO2 20 - 29 mmol/L 23  22  23    Calcium 8.6 - 10.2 mg/dL 64.4  9.9  03.4        Latest Ref Rng & Units 07/12/2022   10:27 AM 12/14/2021   11:09 AM 06/08/2021   10:49 AM  Hepatic Function  Total Protein 6.0 - 8.5 g/dL 7.0  6.7  6.8   Albumin 3.7 - 4.7 g/dL 4.4  4.2  4.5   AST 0 - 40 IU/L 21  18  21    ALT 0 - 44 IU/L 25  16  22    Alk Phosphatase 44 - 121 IU/L 87  70  70   Total Bilirubin 0.0 - 1.2 mg/dL 0.8  0.6  0.8       Latest Ref Rng & Units 07/12/2022   10:27 AM 12/14/2021   11:09 AM 06/08/2021   10:49 AM  CBC  WBC 3.4 - 10.8 x10E3/uL 7.7  7.9  6.5   Hemoglobin 13.0 - 17.7 g/dL 74.2  59.5  63.8   Hematocrit 37.5 - 51.0 % 45.1  40.6  41.2   Platelets 150 - 450 x10E3/uL 225  215  220    Lab Results  Component Value Date   MCV 95 07/12/2022   MCV 93 12/14/2021  MCV 93 06/08/2021   Lab Results  Component Value Date   TSH 2.700 07/12/2022     BNP No results found for: "PROBNP"  Lipid Panel     Component Value Date/Time   CHOL 156 07/12/2022 1027   TRIG 88 07/12/2022 1027   HDL 59 07/12/2022 1027   CHOLHDL 2.6 07/12/2022 1027   CHOLHDL 3.0 06/07/2016 1030   VLDL 28 06/07/2016 1030   LDLCALC 81 07/12/2022 1027    RADIOLOGY: No results found.  IMPRESSION:  1. Atrial flutter, unspecified type (HCC)   2. OSA on CPAP   3. Essential hypertension   4. Mixed hyperlipidemia     ASSESSMENT AND PLAN: Mario Palmer is a young appearing 82 year-old  gentleman who has a history of hypertension, obstructive sleep apnea, palpitations with remote atrial flutter/atrial fibrillation and hyperlipidemia.  He developed atrial fibrillation/flutter and after titration of amiodarone underwent successful DC cardioversion on 08/18/2016 with resolution of atrial flutter and restoration of sinus rhythm.  He has been on chronic eliquis for anticoagulation and denies bleeding.  Over the years his dose of amiodarone has slightly been reduced and when  seen in May 2022 he was changed to 100 mg alternating with 200 mg.  Recently, he has been on amiodarone 100 mg daily and reduced dose Eliquis to 2.5 mg twice a day due to his age greater than 80 and creatinine at 1.51.  Presently, he is unaware of any rhythm disturbance.  However, his ECG today verifies that he is back in atrial flutter with controlled ventricular rate with heart rate at 67 bpm.  He is unaware of duration of his atrial flutter.  He has been on amiodarone 100 mg daily.  He is fasting today and I will check laboratory with a comprehensive metabolic panel, magnesium level, CBC, TSH, lipid studies, and with his history of significant prior PSA elevation most recently at 11.1 on December 14, 2021 I will recheck this as well and have recommended follow-up urologic evaluation.  With his recurrent atrial flutter, I am recommending he undergo an echo Doppler evaluation.  I will further titrate his amiodarone and have recommended for the next week he increase this to 200 mg twice a day and then decrease to 200 mg daily.  Hopefully the increased amiodarone will convert him to sinus rhythm.  I will also refer him to atrial fibrillation clinic.  He does not pharmacologically cardiovert he most likely will require another cardioversion.  With his atrial flutter he may also be a candidate for atrial flutter ablation.  Laboratory done today will allow [place] to monitor amiodarone therapy.  Blood pressure today is stable on his current regimen of Cartia XT 120 mg, lisinopril 40 mg, nebivolol 5 mg and spironolactone 25 mg daily.  He continues to be on low-dose apixaban 2.5 mg twice a day with most recent creatinine at 1.51 in September 2023.  He has seen urology in the past for his elevated PSA.  In December 2019 PSA was 7.6, in May 2021 PSA was 8.6, and in September 2023 PSA was 11.1.  With reference to his obstructive sleep apnea, notices considerable improvement with his new ResMed AirSense 11 AutoSet unit.   Compliance is excellent.  AHI is excellent at 1.7 with his pressure range at 11 to 16 cm with 95th percentile pressure at 14.7 and maximum average pressure 15.6 events per hour.  He continues to be on atorvastatin 20 mg for hyperlipidemia.  I will see him in 3  months for follow-up evaluation or sooner as needed.    Lennette Bihari, MD, [facility], ABSM Diplomate, American Board of Sleep Medicine   11/20/2022 10:43 AM

## 2022-11-20 NOTE — Telephone Encounter (Signed)
Contacted patient regarding follow up appointment per Dr. Tresa Endo. Patient is scheduled for appointment on 8/30 @ 10:00 am to see Tawny Hopping. Patient given directions and he verbalized understanding.

## 2022-11-20 NOTE — Patient Instructions (Signed)
Medication Instructions:  Increase Amiodarone 200mg  twice daily *If you need a refill on your cardiac medications before your next appointment, please call your pharmacy*   Lab Work: If you have labs (blood work) drawn today and your tests are completely normal, you will receive your results only by: MyChart Message (if you have MyChart) OR A paper copy in the mail If you have any lab test that is abnormal or we need to change your treatment, we will call you to review the results.   Follow-Up: At [facility], you and your health needs are our priority.  As part of our continuing mission to provide you with exceptional heart care, we have created designated Provider Care Teams.  These Care Teams include your primary Cardiologist (physician) and Advanced Practice Providers (APPs -  Physician Assistants and Nurse Practitioners) who all work together to provide you with the care you need, when you need it.  We recommend signing up for the patient portal called "MyChart".  Sign up information is provided on this After Visit Summary.  MyChart is used to connect with patients for Virtual Visits (Telemedicine).  Patients are able to view lab/test results, encounter notes, upcoming appointments, etc.  Non-urgent messages can be sent to your provider as well.   To learn more about what you can do with MyChart, go to [email or web address].    Your next appointment:   2-3 month(s) after A-fib clinic  Provider:   Nicki Guadalajara, MD

## 2022-11-21 ENCOUNTER — Encounter: Payer: Self-pay | Admitting: Cardiovascular Disease

## 2022-11-22 ENCOUNTER — Ambulatory Visit: Payer: Medicare Other | Admitting: Nurse Practitioner

## 2022-12-01 ENCOUNTER — Ambulatory Visit (HOSPITAL_COMMUNITY): Payer: Medicare Other | Admitting: Internal Medicine

## 2022-12-19 ENCOUNTER — Ambulatory Visit (HOSPITAL_COMMUNITY)
Admission: RE | Admit: 2022-12-19 | Discharge: 2022-12-19 | Disposition: A | Discharge status: Home / Self Care | Payer: Medicare Other | Source: Ambulatory Visit | Attending: Internal Medicine | Admitting: Internal Medicine

## 2022-12-19 VITALS — BP 158/92 | HR 60 | Ht 70.0 in | Wt 195.2 lb

## 2022-12-19 DIAGNOSIS — I480 Paroxysmal atrial fibrillation: Secondary | ICD-10-CM | POA: Diagnosis not present

## 2022-12-19 DIAGNOSIS — I483 Typical atrial flutter: Secondary | ICD-10-CM | POA: Insufficient documentation

## 2022-12-19 DIAGNOSIS — D6869 Other thrombophilia: Secondary | ICD-10-CM | POA: Insufficient documentation

## 2022-12-19 DIAGNOSIS — Z79899 Other long term (current) drug therapy: Secondary | ICD-10-CM | POA: Diagnosis not present

## 2022-12-19 DIAGNOSIS — I4892 Unspecified atrial flutter: Secondary | ICD-10-CM | POA: Insufficient documentation

## 2022-12-19 DIAGNOSIS — I4819 Other persistent atrial fibrillation: Secondary | ICD-10-CM | POA: Diagnosis not present

## 2022-12-19 DIAGNOSIS — I110 Hypertensive heart disease with heart failure: Secondary | ICD-10-CM | POA: Insufficient documentation

## 2022-12-19 DIAGNOSIS — I509 Heart failure, unspecified: Secondary | ICD-10-CM | POA: Diagnosis not present

## 2022-12-19 LAB — COMPREHENSIVE METABOLIC PANEL
ALT: 36 U/L (ref 0–44)
AST: 29 U/L (ref 15–41)
Albumin: 3.8 g/dL (ref 3.5–5.0)
Alkaline Phosphatase: 86 U/L (ref 38–126)
Anion gap: 8 (ref 5–15)
BUN: 30 mg/dL — ABNORMAL HIGH (ref 8–23)
CO2: 25 mmol/L (ref 22–32)
Calcium: 9.5 mg/dL (ref 8.9–10.3)
Chloride: 106 mmol/L (ref 98–111)
Creatinine, Ser: 1.65 mg/dL — ABNORMAL HIGH (ref 0.61–1.24)
GFR, Estimated: 41 mL/min — ABNORMAL LOW (ref >60)
Glucose, Bld: 88 mg/dL (ref 70–99)
Potassium: 5 mmol/L (ref 3.5–5.1)
Sodium: 139 mmol/L (ref 135–145)
Total Bilirubin: 1 mg/dL (ref 0.3–1.2)
Total Protein: 6.7 g/dL (ref 6.5–8.1)

## 2022-12-19 LAB — CBC
HCT: 43.3 % (ref 39.0–52.0)
Hemoglobin: 13.9 g/dL (ref 13.0–17.0)
MCH: 31.3 pg (ref 26.0–34.0)
MCHC: 32.1 g/dL (ref 30.0–36.0)
MCV: 97.5 fL (ref 80.0–100.0)
Platelets: 229 10*3/uL (ref 150–400)
RBC: 4.44 MIL/uL (ref 4.22–5.81)
RDW: 13.5 % (ref 11.5–15.5)
WBC: 7.6 10*3/uL (ref 4.0–10.5)
nRBC: 0 % (ref 0.0–0.2)

## 2022-12-19 LAB — TSH: TSH: 2.826 u[IU]/mL (ref 0.350–4.500)

## 2022-12-19 NOTE — Patient Instructions (Addendum)
    Cardioversion scheduled for: Tuesday, October 1st   - Arrive at the [facility] and go to admitting at 830am   - Do not eat or drink anything after midnight the night prior to your procedure.   - Take all your morning medication (except diabetic medications) with a sip of water prior to arrival.  - You will not be able to drive home after your procedure.    - Do NOT miss any doses of your blood thinner - if you should miss a dose please notify our office immediately.   - If you feel as if you go back into normal rhythm prior to scheduled cardioversion, please notify our office immediately.   If your procedure is canceled in the cardioversion suite you will be charged a cancellation fee.

## 2022-12-19 NOTE — Progress Notes (Signed)
Primary Care Physician: [name], FNP Primary Cardiologist: Nicki Guadalajara, MD Electrophysiologist: None     Referring Physician: Dr. Thayer Jew P Derden is a 82 y.o. male with a history of HTN, OSA on CPAP, GERD, HLD, and persistent atrial flutter who presents for consultation in the [facility]. Followed by Dr. Tresa Endo and has been on amiodarone for several years. Last seen by Dr. Tresa Endo on 8/19 and noted to be in atrial flutter; amiodarone reloaded to 200 mg BID. Patient is on Eliquis for a CHADS2VASC score of 3.  On evaluation today, he is currently in atrial flutter. He can feel palpitations intermittently. He is currently taking amiodarone 200 mg BID as recommended by Dr. Tresa Endo. He missed a dose of Eliquis and started back on full anticoagulation on 9/8.    Today, he denies symptoms of chest pain, shortness of breath, orthopnea, PND, lower extremity edema, dizziness, presyncope, syncope, snoring, daytime somnolence, bleeding, or neurologic sequela. The patient is tolerating medications without difficulties and is otherwise without complaint today.    Atrial Fibrillation Risk Factors:  he does have symptoms or diagnosis of sleep apnea. he is compliant with CPAP therapy.  he has a BMI of Body mass index is 28.01 kg/m.Marland Kitchen Filed Weights   12/19/22 0917  Weight: 88.5 kg    Current Outpatient Medications  Medication Sig Dispense Refill   amiodarone (PACERONE) 200 MG tablet Take 1 tablet (200 mg total) by mouth 2 (two) times daily. 180 tablet 3   apixaban (ELIQUIS) 2.5 MG TABS tablet TAKE 1 TABLET TWICE A DAY 60 tablet 5   atorvastatin (LIPITOR) 20 MG tablet TAKE ONE TABLET BY MOUTH ONE TIME DAILY 90 tablet 3   Cholecalciferol (VITAMIN D) 2000 UNITS tablet Take 2,000 Units by mouth daily.     diltiazem (CARTIA XT) 120 MG 24 hr capsule TAKE ONE CAPSULE BY MOUTH DAILY AT BEDTIME 90 capsule 3   lisinopril (ZESTRIL) 40 MG tablet TAKE ONE TABLET BY  MOUTH ONE TIME DAILY 90 tablet 3   Multiple Vitamins-Minerals (MULTIVITAMIN ADULT PO) Take 1 tablet by mouth daily.     nebivolol (BYSTOLIC) 10 MG tablet Take 0.5 tablets (5 mg total) by mouth daily. 90 tablet 3   NON FORMULARY CPAP therapy     Saccharomyces boulardii (PROBIOTIC) 250 MG CAPS Take 1 capsule by mouth daily.     sodium chloride (OCEAN) 0.65 % SOLN nasal spray Place 1 spray into both nostrils as needed for congestion.     spironolactone (ALDACTONE) 25 MG tablet TAKE 1 TABLET ONCE DAILY 30 tablet 2   tamsulosin (FLOMAX) 0.4 MG CAPS capsule Take 0.4 mg by mouth daily.     No current facility-administered medications for this encounter.    Atrial Fibrillation Management history:  Previous antiarrhythmic drugs: amiodarone Previous cardioversions: 09/12/16 Previous ablations: None Anticoagulation history: Eliquis   ROS- All systems are reviewed and negative except as per the HPI above.  Physical Exam: BP (!) 158/92   Pulse 60   Ht 5\' 10"  (1.778 m)   Wt 88.5 kg   BMI 28.01 kg/m   GEN: Well nourished, well developed in no acute distress NECK: No JVD; No carotid bruits CARDIAC: Regular rate (atrial flutter), no murmurs, rubs, gallops RESPIRATORY:  Clear to auscultation without rales, wheezing or rhonchi  ABDOMEN: Soft, non-tender, non-distended EXTREMITIES:  No edema; No deformity   EKG today demonstrates  Vent. rate 60 BPM PR interval * ms QRS duration  106 ms QT/QTcB 464/464 ms P-R-T axes -2 65 8 Atrial flutter with 4:1 A-V conduction Abnormal ECG When compared with ECG of 20-Nov-2022 10:54, PREVIOUS ECG IS PRESENT  Echo 08/11/22 demonstrated  1. Left ventricular ejection fraction, by estimation, is 55 to 60%. The  left ventricle has normal function. The left ventricle has no regional  wall motion abnormalities. Left ventricular diastolic parameters are  indeterminate.   2. Right ventricular systolic function is normal. The right ventricular  size is normal.    3. Left atrial size was severely dilated.   4. The mitral valve is abnormal. Mild mitral valve regurgitation. No  evidence of mitral stenosis.   5. The aortic valve is tricuspid. There is mild calcification of the  aortic valve. There is mild thickening of the aortic valve. Aortic valve  regurgitation is trivial. Aortic valve sclerosis is present, with no  evidence of aortic valve stenosis.   6. The inferior vena cava is normal in size with greater than 50%  respiratory variability, suggesting right atrial pressure of 3 mmHg.   ASSESSMENT & PLAN CHA2DS2-VASc Score = 3  The patient's score is based upon: CHF History: 0 HTN History: 1 Diabetes History: 0 Stroke History: 0 Vascular Disease History: 0 Age Score: 2 Gender Score: 0       ASSESSMENT AND PLAN: Persistent Atrial Fibrillation (ICD10:  I48.19) / persistent atrial flutter The patient's CHA2DS2-VASc score is 3, indicating a 3.2% annual risk of stroke.    He is currently in atrial flutter. Discussion with patient about performing repeat cardioversion and also going forward whether he would be interested in discussing ablation with EP. After discussion, he declines referral to EP. He agrees to perform repeat cardioversion. Labs obtained today including amiodarone surveillance labs.   Informed Consent   Shared Decision Making/Informed Consent The risks (stroke, cardiac arrhythmias rarely resulting in the need for a temporary or permanent pacemaker, skin irritation or burns and complications associated with conscious sedation including aspiration, arrhythmia, respiratory failure and death), benefits (restoration of normal sinus rhythm) and alternatives of a direct current cardioversion were explained in detail to [name] and he agrees to proceed.      Secondary Hypercoagulable State (ICD10:  D68.69) The patient is at significant risk for stroke/thromboembolism based upon his CHA2DS2-VASc Score of 3.  Continue Apixaban  (Eliquis).  Emphasis on compliance with no missed doses.   Follow up as scheduled with Dr. Tresa Endo.   Lake Bells, PA-C  Af[facility] Clinic [facility] [address] [place], [place] [address] [phone]

## 2022-12-19 NOTE — H&P (View-Only) (Signed)
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 male with a history of HTN, OSA on CPAP, GERD, HLD, and persistent atrial flutter who presents for consultation in the [facility]. Followed by Dr. Tresa Endo and has been on amiodarone for several years. Last seen by Dr. Tresa Endo on 8/19 and noted to be in atrial flutter; amiodarone reloaded to 200 mg BID. Patient is on Eliquis for a CHADS2VASC score of 3.  On evaluation today, he is currently in atrial flutter. He can feel palpitations intermittently. He is currently taking amiodarone 200 mg BID as recommended by Dr. Tresa Endo. He missed a dose of Eliquis and started back on full anticoagulation on 9/8.    Today, he denies symptoms of chest pain, shortness of breath, orthopnea, PND, lower extremity edema, dizziness, presyncope, syncope, snoring, daytime somnolence, bleeding, or neurologic sequela. The patient is tolerating medications without difficulties and is otherwise without complaint today.    Atrial Fibrillation Risk Factors:  he does have symptoms or diagnosis of sleep apnea. he is compliant with CPAP therapy.  he has a BMI of Body mass index is 28.01 kg/m.Marland Kitchen Filed Weights   12/19/22 0917  Weight: 88.5 kg    Current Outpatient Medications  Medication Sig Dispense Refill   amiodarone (PACERONE) 200 MG tablet Take 1 tablet (200 mg total) by mouth 2 (two) times daily. 180 tablet 3   apixaban (ELIQUIS) 2.5 MG TABS tablet TAKE 1 TABLET TWICE A DAY 60 tablet 5   atorvastatin (LIPITOR) 20 MG tablet TAKE ONE TABLET BY MOUTH ONE TIME DAILY 90 tablet 3   Cholecalciferol (VITAMIN D) 2000 UNITS tablet Take 2,000 Units by mouth daily.     diltiazem (CARTIA XT) 120 MG 24 hr capsule TAKE ONE CAPSULE BY MOUTH DAILY AT BEDTIME 90 capsule 3   lisinopril (ZESTRIL) 40 MG tablet TAKE ONE TABLET BY  MOUTH ONE TIME DAILY 90 tablet 3   Multiple Vitamins-Minerals (MULTIVITAMIN ADULT PO) Take 1 tablet by mouth daily.     nebivolol (BYSTOLIC) 10 MG tablet Take 0.5 tablets (5 mg total) by mouth daily. 90 tablet 3   NON FORMULARY CPAP therapy     Saccharomyces boulardii (PROBIOTIC) 250 MG CAPS Take 1 capsule by mouth daily.     sodium chloride (OCEAN) 0.65 % SOLN nasal spray Place 1 spray into both nostrils as needed for congestion.     spironolactone (ALDACTONE) 25 MG tablet TAKE 1 TABLET ONCE DAILY 30 tablet 2   tamsulosin (FLOMAX) 0.4 MG CAPS capsule Take 0.4 mg by mouth daily.     No current facility-administered medications for this encounter.    Atrial Fibrillation Management history:  Previous antiarrhythmic drugs: amiodarone Previous cardioversions: 09/12/16 Previous ablations: None Anticoagulation history: Eliquis   ROS- All systems are reviewed and negative except as per the HPI above.  Physical Exam: BP (!) 158/92   Pulse 60   Ht 5\' 10"  (1.778 m)   Wt 88.5 kg   BMI 28.01 kg/m   GEN: Well nourished, well developed in no acute distress NECK: No JVD; No carotid bruits CARDIAC: Regular rate (atrial flutter), no murmurs, rubs, gallops RESPIRATORY:  Clear to auscultation without rales, wheezing or rhonchi  ABDOMEN: Soft, non-tender, non-distended EXTREMITIES:  No edema; No deformity   EKG today demonstrates  Vent. rate 60 BPM PR interval * ms QRS duration  106 ms QT/QTcB 464/464 ms Palmer-R-T axes -2 65 8 Atrial flutter with 4:1 A-V conduction Abnormal ECG When compared with ECG of 20-Nov-2022 10:54, PREVIOUS ECG IS PRESENT  Echo 08/11/22 demonstrated  1. Left ventricular ejection fraction, by estimation, is 55 to 60%. The  left ventricle has normal function. The left ventricle has no regional  wall motion abnormalities. Left ventricular diastolic parameters are  indeterminate.   2. Right ventricular systolic function is normal. The right ventricular  size is normal.    3. Left atrial size was severely dilated.   4. The mitral valve is abnormal. Mild mitral valve regurgitation. No  evidence of mitral stenosis.   5. The aortic valve is tricuspid. There is mild calcification of the  aortic valve. There is mild thickening of the aortic valve. Aortic valve  regurgitation is trivial. Aortic valve sclerosis is present, with no  evidence of aortic valve stenosis.   6. The inferior vena cava is normal in size with greater than 50%  respiratory variability, suggesting right atrial pressure of 3 mmHg.   ASSESSMENT & PLAN CHA2DS2-VASc Score = 3  The patient's score is based upon: CHF History: 0 HTN History: 1 Diabetes History: 0 Stroke History: 0 Vascular Disease History: 0 Age Score: 2 Gender Score: 0       ASSESSMENT AND PLAN: Persistent Atrial Fibrillation (ICD10:  I48.19) / persistent atrial flutter The patient's CHA2DS2-VASc score is 3, indicating a 3.2% annual risk of stroke.    He is currently in atrial flutter. Discussion with patient about performing repeat cardioversion and also going forward whether he would be interested in discussing ablation with EP. After discussion, he declines referral to EP. He agrees to perform repeat cardioversion. Labs obtained today including amiodarone surveillance labs.   Informed Consent   Shared Decision Making/Informed Consent The risks (stroke, cardiac arrhythmias rarely resulting in the need for a temporary or permanent pacemaker, skin irritation or burns and complications associated with conscious sedation including aspiration, arrhythmia, respiratory failure and death), benefits (restoration of normal sinus rhythm) and alternatives of a direct current cardioversion were explained in detail to [name] and he agrees to proceed.      Secondary Hypercoagulable State (ICD10:  D68.69) The patient is at significant risk for stroke/thromboembolism based upon his CHA2DS2-VASc Score of 3.  Continue Apixaban  (Eliquis).  Emphasis on compliance with no missed doses.   Follow up as scheduled with Dr. Tresa Endo.   Mario Bells, PA-C  [facility] [address] [place], [place] [address] [phone]

## 2022-12-21 ENCOUNTER — Telehealth (HOSPITAL_COMMUNITY): Payer: Self-pay | Admitting: *Deleted

## 2022-12-21 MED ORDER — AMIODARONE HCL 200 MG PO TABS: 200.0000 mg | ORAL_TABLET | Freq: Every day | ORAL | 2 refills | Status: DC

## 2022-12-21 NOTE — Telephone Encounter (Signed)
Per Landry Mellow PA will decrease amiodarone to 200mg  once a day as of today (1 month of BID dosing completed). Pt notified.

## 2023-01-01 NOTE — Progress Notes (Signed)
Pt called back and and is unable to come at 715 and will be here at 830.

## 2023-01-01 NOTE — Progress Notes (Addendum)
Left detailed voice message to patient and instructed them to come at 07:15  and to be NPO after 0000. Advised to take cardiac medications and Eliquis with a sip of water in the morning and to hold diuretics.   Advised patient to have a ride home and someone to stay with them for 24 hours after the procedure.

## 2023-01-02 ENCOUNTER — Ambulatory Visit (HOSPITAL_COMMUNITY)
Admission: RE | Admit: 2023-01-02 | Discharge: 2023-01-02 | Disposition: A | Discharge status: Home / Self Care | Payer: Medicare Other | Attending: Internal Medicine | Admitting: Internal Medicine

## 2023-01-02 ENCOUNTER — Ambulatory Visit (HOSPITAL_COMMUNITY): Payer: Medicare Other | Admitting: Anesthesiology

## 2023-01-02 ENCOUNTER — Encounter (HOSPITAL_COMMUNITY)
Admission: RE | Disposition: A | Discharge status: Home / Self Care | Payer: Self-pay | Source: Home / Self Care | Attending: Internal Medicine

## 2023-01-02 ENCOUNTER — Other Ambulatory Visit: Payer: Self-pay

## 2023-01-02 DIAGNOSIS — I10 Essential (primary) hypertension: Secondary | ICD-10-CM | POA: Diagnosis not present

## 2023-01-02 DIAGNOSIS — I4891 Unspecified atrial fibrillation: Secondary | ICD-10-CM | POA: Diagnosis not present

## 2023-01-02 DIAGNOSIS — D6869 Other thrombophilia: Secondary | ICD-10-CM | POA: Insufficient documentation

## 2023-01-02 DIAGNOSIS — G4733 Obstructive sleep apnea (adult) (pediatric): Secondary | ICD-10-CM | POA: Insufficient documentation

## 2023-01-02 DIAGNOSIS — E785 Hyperlipidemia, unspecified: Secondary | ICD-10-CM | POA: Diagnosis not present

## 2023-01-02 DIAGNOSIS — I4819 Other persistent atrial fibrillation: Secondary | ICD-10-CM | POA: Diagnosis not present

## 2023-01-02 DIAGNOSIS — I4892 Unspecified atrial flutter: Secondary | ICD-10-CM | POA: Insufficient documentation

## 2023-01-02 DIAGNOSIS — K219 Gastro-esophageal reflux disease without esophagitis: Secondary | ICD-10-CM | POA: Diagnosis not present

## 2023-01-02 DIAGNOSIS — Z7901 Long term (current) use of anticoagulants: Secondary | ICD-10-CM | POA: Insufficient documentation

## 2023-01-02 DIAGNOSIS — I480 Paroxysmal atrial fibrillation: Secondary | ICD-10-CM | POA: Diagnosis not present

## 2023-01-02 DIAGNOSIS — G4730 Sleep apnea, unspecified: Secondary | ICD-10-CM | POA: Diagnosis not present

## 2023-01-02 HISTORY — PX: CARDIOVERSION: SHX1299

## 2023-01-02 SURGERY — CARDIOVERSION
Anesthesia: Monitor Anesthesia Care

## 2023-01-02 MED ORDER — SODIUM CHLORIDE 0.9 % IV SOLN: INTRAVENOUS | Status: DC

## 2023-01-02 MED ORDER — LIDOCAINE 2% (20 MG/ML) 5 ML SYRINGE
INTRAMUSCULAR | Status: DC | PRN
  Administered 2023-01-02: 100 mg via INTRAVENOUS

## 2023-01-02 SURGICAL SUPPLY — 1 items: ELECT DEFIB PAD ADLT CADENCE (PAD) ×1 IMPLANT

## 2023-01-02 NOTE — Anesthesia Preprocedure Evaluation (Addendum)
Anesthesia Evaluation  Patient identified by MRN, date of birth, ID band Patient awake    Reviewed: Allergy & Precautions, NPO status , Patient's Chart, lab work & pertinent test results  Airway Mallampati: II  TM Distance: >3 FB Neck ROM: Full    Dental no notable dental hx.    Pulmonary sleep apnea and Continuous Positive Airway Pressure Ventilation , former smoker   Pulmonary exam normal        Cardiovascular hypertension, Pt. on medications and Pt. on home beta blockers Normal cardiovascular exam+ dysrhythmias Atrial Fibrillation   ECHO: 1. Left ventricular ejection fraction, by estimation, is 55 to 60%. The  left ventricle has normal function. The left ventricle has no regional  wall motion abnormalities. Left ventricular diastolic parameters are  indeterminate.   2. Right ventricular systolic function is normal. The right ventricular  size is normal.   3. Left atrial size was severely dilated.   4. The mitral valve is abnormal. Mild mitral valve regurgitation. No  evidence of mitral stenosis.   5. The aortic valve is tricuspid. There is mild calcification of the  aortic valve. There is mild thickening of the aortic valve. Aortic valve  regurgitation is trivial. Aortic valve sclerosis is present, with no  evidence of aortic valve stenosis.   6. The inferior vena cava is normal in size with greater than 50%  respiratory variability, suggesting right atrial pressure of 3 mmHg.     Neuro/Psych negative neurological ROS  negative psych ROS   GI/Hepatic Neg liver ROS,GERD  Medicated and Controlled,,  Endo/Other  negative endocrine ROS    Renal/GU CRFRenal disease     Musculoskeletal  (+) Arthritis ,    Abdominal   Peds  Hematology  (+) Blood dyscrasia (Eliquis)   Anesthesia Other Findings A-FIB  Reproductive/Obstetrics                             Anesthesia Physical Anesthesia  Plan  ASA: 3  Anesthesia Plan: MAC   Post-op Pain Management:    Induction: Intravenous  PONV Risk Score and Plan: 1 and Propofol infusion and Treatment may vary due to age or medical condition  Airway Management Planned: Simple Face Mask  Additional Equipment:   Intra-op Plan:   Post-operative Plan:   Informed Consent: I have reviewed the patients History and Physical, chart, labs and discussed the procedure including the risks, benefits and alternatives for the proposed anesthesia with the patient or authorized representative who has indicated his/her understanding and acceptance.     Dental advisory given  Plan Discussed with: CRNA  Anesthesia Plan Comments:        Anesthesia Quick Evaluation

## 2023-01-02 NOTE — CV Procedure (Signed)
Procedure: Electrical Cardioversion Indications:  Atrial Flutter  Procedure Details:  Consent: Risks of procedure as well as the alternatives and risks of each were explained to the (patient/caregiver).  Consent for procedure obtained.  Time Out: Verified patient identification, verified procedure, site/side was marked, verified correct patient position, special equipment/implants available, medications/allergies/relevent history reviewed, required imaging and test results available. PERFORMED.  Patient placed on cardiac monitor, pulse oximetry, supplemental oxygen as necessary.  Sedation given:  propofol, see anesthesia note Pacer pads placed anterior and posterior chest.  Cardioverted 1 time(s).  Cardioversion with synchronized biphasic 120J shock.  Evaluation: Findings: Post procedure EKG shows: NSR Complications: None Patient did tolerate procedure well.  Time Spent Directly with the Patient:  15 minutes   Mario Fus 01/02/2023, 9:17 AM

## 2023-01-02 NOTE — Interval H&P Note (Signed)
History and Physical Interval Note:  01/02/2023 9:17 AM  Mario Palmer  has presented today for surgery, with the diagnosis of AFIB.  The various methods of treatment have been discussed with the patient and family. After consideration of risks, benefits and other options for treatment, the patient has consented to  Procedure(s): CARDIOVERSION (N/A) as a surgical intervention.  The patient's history has been reviewed, patient examined, no change in status, stable for surgery.  I have reviewed the patient's chart and labs.  Questions were answered to the patient's satisfaction.     Maisie Fus

## 2023-01-02 NOTE — Anesthesia Postprocedure Evaluation (Signed)
Anesthesia Post Note  Patient: Mario Palmer  Procedure(s) Performed: CARDIOVERSION     Patient location during evaluation: Cath Lab Anesthesia Type: MAC Level of consciousness: awake Pain management: pain level controlled Vital Signs Assessment: post-procedure vital signs reviewed and stable Respiratory status: spontaneous breathing, nonlabored ventilation and respiratory function stable Cardiovascular status: blood pressure returned to baseline and stable Postop Assessment: no apparent nausea or vomiting Anesthetic complications: no   No notable events documented.  Last Vitals:  Vitals:   01/02/23 0935 01/02/23 0940  BP: 116/75 108/75  Pulse: 65 64  Resp: 16 17  Temp:  (!) 36.1 C  SpO2: 98% 95%    Last Pain:  Vitals:   01/02/23 0940  TempSrc: Temporal  PainSc:                  Catheryn Bacon Theodor Mustin

## 2023-01-02 NOTE — Discharge Instructions (Signed)
 Electrical Cardioversion Electrical cardioversion is the delivery of a jolt of electricity to restore a normal rhythm to the heart. A rhythm that is too fast or is not regular (arrhythmia) keeps the heart from pumping blood well. There is also another type of cardioversion called a chemical (pharmacologic) cardioversion. This is when your health care provider gives you one or more medicines to bring back your regular heart rhythm. Electrical cardioversion is done as a scheduled procedure for arrhythmiasthat are not life-threatening. Electrical cardioversion may also be done in an emergency for sudden life-threatening arrhythmias. Tell a health care provider about: Any allergies you have. All medicines you are taking, including vitamins, herbs, eye drops, creams, and over-the-counter medicines. Any problems you or family members have had with sedatives or anesthesia. Any bleeding problems you have. Any surgeries you have had, including a pacemaker, defibrillator, or other implanted device. Any medical conditions you have. Whether you are pregnant or may be pregnant. What are the risks? Your provider will talk with you about risks. These include: Allergic reactions to medicines. Irritation to the skin on your chest or back where the sticky pads (electrodes) or paddles were put during electrical cardioversion. A blood clot that breaks free and travels to other parts of your body, such as your brain. Return of a worse abnormal heart rhythm that will need to be treated with medicines, a pacemaker, or an implantable cardioverter defibrillator (ICD). What happens before the procedure? Medicines Your provider may give you: Blood-thinning medicines (anticoagulants) so your blood does not clot as easily. If your provider gives you this medicine, you may need to take it for 4 weeks before the procedure. Medicines to help stabilize your heart rate and rhythm. Ask your provider about: Changing or stopping  your regular medicines. These include any diabetes medicines or blood thinners you take. Taking medicines such as aspirin and ibuprofen. These medicines can thin your blood. Do not take them unless your provider tells you to. Taking over-the-counter medicines, vitamins, herbs, and supplements. General instructions Follow instructions from your provider about what you may eat and drink. Do not put any lotions, powders, or ointments on your chest and back for 24 hours before the procedure. They can cause problems with the electrodes or paddles used to deliver electricity to your heart. Do not wear jewelry as this can interfere with delivering electricity to your heart. If you will be going home right after the procedure, plan to have a responsible adult: Take you home from the hospital or clinic. You will not be allowed to drive. Care for you for the time you are told. Tests You may have an exam or testing. This may include: Blood labs. A transesophageal echocardiogram (TEE). What happens during the procedure?     An IV will be inserted into one of your veins. You will be given a sedative. This helps you relax. Electrodes or metal paddles will be placed on your chest. They may be placed in one of these ways: One placed on your right chest, the other on the left ribs. One placed on your chest and the other on your back. An electrical shock will be delivered. The shock briefly stops (resets) your heart rhythm. Your provider will check to see if your heart rhythm is now normal. Some people need only one shock. Some need more to restore a normal heart rhythm. The procedure may vary among providers and hospitals. What happens after the procedure? Your blood pressure, heart rate, breathing rate, and blood oxygen  level will be monitored until you leave the hospital or clinic. Your heart rhythm will be watched to make sure it does not change. This information is not intended to replace advice  given to you by your health care provider. Make sure you discuss any questions you have with your health care provider. Document Revised: 11/10/2021 Document Reviewed: 11/10/2021 Elsevier Patient Education  2024 [employer].

## 2023-01-02 NOTE — Transfer of Care (Signed)
Immediate Anesthesia Transfer of Care Note  Patient: Mario Palmer  Procedure(s) Performed: CARDIOVERSION  Patient Location: PACU  Anesthesia Type:MAC  Level of Consciousness: drowsy  Airway & Oxygen Therapy: Patient Spontanous Breathing and Patient connected to face mask oxygen  Post-op Assessment: Report given to RN and Post -op Vital signs reviewed and stable  Post vital signs: Reviewed and stable  Last Vitals:  Vitals Value Taken Time  BP    Temp    Pulse    Resp    SpO2      Last Pain:  Vitals:   01/02/23 0843  TempSrc: Temporal  PainSc: 0-No pain         Complications: No notable events documented.

## 2023-01-03 ENCOUNTER — Encounter (HOSPITAL_COMMUNITY): Payer: Self-pay | Admitting: Internal Medicine

## 2023-01-04 ENCOUNTER — Other Ambulatory Visit: Payer: Self-pay | Admitting: Cardiovascular Disease

## 2023-01-17 NOTE — Progress Notes (Signed)
Mario Palmer, CMA,acting as a Neurosurgeon for Mario Felts, FNP.,have documented all relevant documentation on the behalf of Mario Felts, FNP,as directed by  Mario Felts, FNP while in the presence of Mario Felts, FNP.  Subjective:  Patient ID: Mario Palmer , male    DOB: February 07, 1941 , 82 y.o.   MRN: [id number]  Chief Complaint  Patient presents with   Hypertension    HPI  Patient presents today for a bp and chol follow up, Patient reports compliance with medication. Patient denies any chest pain, SOB, or headaches. Patient has no concerns today. He had a cardioversion for his atrial flutter earlier this month. Wearing CPAP and does well. He has been to see Dr. Luciana Palmer (retina specialist). For his cataract repair. He is seeing him once a year.   BP Readings from Last 3 Encounters: 01/18/23 : 130/80 01/02/23 : 108/75 12/19/22 : Mario Palmer Kitchen 158/92       Past Medical History:  Diagnosis Date   Arthritis    Cancer (HCC)    skin cancer   Dysrhythmia    afib   GERD (gastroesophageal reflux disease)    High cholesterol    History of kidney stones    Hx of echocardiogram 12/2010   EF 55%, There is Stage 1 diastolic dyfunction. Normal LV filling pressure. Aortic sclerosis with mild AI, mild TR   Hypertension    Palpitation    with ecotopy   Sleep apnea      No family history on file.   Current Outpatient Medications:    amiodarone (PACERONE) 200 MG tablet, Take 1 tablet (200 mg total) by mouth daily., Disp: 90 tablet, Rfl: 2   apixaban (ELIQUIS) 2.5 MG TABS tablet, TAKE 1 TABLET TWICE A DAY, Disp: 60 tablet, Rfl: 5   atorvastatin (LIPITOR) 20 MG tablet, TAKE ONE TABLET BY MOUTH ONE TIME DAILY, Disp: 90 tablet, Rfl: 3   Cholecalciferol (VITAMIN D) 2000 UNITS tablet, Take 2,000 Units by mouth daily., Disp: , Rfl:    diltiazem (CARTIA XT) 120 MG 24 hr capsule, TAKE ONE CAPSULE BY MOUTH DAILY AT BEDTIME, Disp: 90 capsule, Rfl: 3   lisinopril (ZESTRIL) 40 MG tablet, TAKE ONE TABLET BY  MOUTH ONE TIME DAILY, Disp: 90 tablet, Rfl: 3   Multiple Vitamins-Minerals (MULTIVITAMIN ADULT PO), Take 1 tablet by mouth daily., Disp: , Rfl:    nebivolol (BYSTOLIC) 10 MG tablet, Take 0.5 tablets (5 mg total) by mouth daily., Disp: 90 tablet, Rfl: 3   NON FORMULARY, CPAP therapy, Disp: , Rfl:    Saccharomyces boulardii (PROBIOTIC) 250 MG CAPS, Take 250 mg by mouth daily., Disp: , Rfl:    spironolactone (ALDACTONE) 25 MG tablet, TAKE 1 TABLET ONCE DAILY, Disp: 30 tablet, Rfl: 2   tamsulosin (FLOMAX) 0.4 MG CAPS capsule, Take 0.4 mg by mouth daily., Disp: , Rfl:    No Known Allergies   Review of Systems  Constitutional: Negative.   HENT: Negative.    Eyes: Negative.   Respiratory: Negative.    Cardiovascular: Negative.   Gastrointestinal: Negative.   Neurological: Negative.   Psychiatric/Behavioral: Negative.       Today's Vitals   01/18/23 1146  BP: 130/80  Pulse: 61  Temp: 97.8 F (36.6 C)  TempSrc: Oral  Weight: 185 lb 12.8 oz (84.3 kg)  Height: 5\' 10"  (1.778 m)  PainSc: 6   PainLoc: Knee   Body mass index is 26.66 kg/m.  Wt Readings from Last 3 Encounters:  01/18/23 185 lb (83.9 kg)  01/18/23 185 lb 12.8 oz (84.3 kg)  01/02/23 192 lb (87.1 kg)      Objective:  Physical Exam Vitals reviewed.  Constitutional:      General: He is not in acute distress.    Appearance: Normal appearance.  HENT:     Ears:     Comments: Wearing bilateral hearing aids.  Cardiovascular:     Rate and Rhythm: Normal rate and regular rhythm.     Pulses: Normal pulses.     Heart sounds: Normal heart sounds. No murmur heard. Pulmonary:     Effort: No respiratory distress.     Breath sounds: Normal breath sounds.  Skin:    General: Skin is warm and dry.     Capillary Refill: Capillary refill takes less than 2 seconds.     Comments: Skin has multiple areas of superficial bruising (elderly)  Neurological:     General: No focal deficit present.     Mental Status: He is alert and  oriented to person, place, and time.  Psychiatric:        Mood and Affect: Mood normal.        Behavior: Behavior normal.        Thought Content: Thought content normal.        Judgment: Judgment normal.         Assessment And Plan:  Mixed hyperlipidemia Assessment & Plan: Cholesterol levels are normal.  Continue statin, tolerating well.   Hypertensive heart disease without heart failure Assessment & Plan: Blood pressure is fairly controlled, recheck is... Continue current medications and follow-up with cardiologist.   Essential hypertension  Influenza vaccination declined Assessment & Plan: Patient declined influenza vaccination at this time. Patient is aware that influenza vaccine prevents illness in 70% of healthy people, and reduces hospitalizations to 30-70% in elderly. This vaccine is recommended annually. Education has been provided regarding the importance of this vaccine but patient still declined. Advised may receive this vaccine at local pharmacy or Health Dept.or vaccine clinic. Aware to provide a copy of the vaccination record if obtained from local pharmacy or Health Dept.  Pt is willing to accept risk associated with refusing vaccination.    Herpes zoster vaccination declined Assessment & Plan: Declines shingrix, educated on disease process and is aware if he changes his mind to notify office    Tetanus, diphtheria, and acellular pertussis (Tdap) vaccination declined  Overweight with body mass index (BMI) of 26 to 26.9 in adult  OSA on CPAP Assessment & Plan: Continue using CPAP, tolerating well and feels like it is beneficial for better quality of life.   Elevated PSA Assessment & Plan: Continues to have an elevated PSA, seen by urology in August and is currently treating with Flomax.  At this time not considering biopsy or further evaluation other than routine follow-up.   Uses hearing aid    Return for 6 month bp check.  Patient was given  opportunity to ask questions. Patient verbalized understanding of the plan and was able to repeat key elements of the plan. All questions were answered to their satisfaction.    Mario Sparrow, FNP, have reviewed all documentation for this visit. The documentation on 01/18/23 for the exam, diagnosis, procedures, and orders are all accurate and complete.   IF YOU HAVE BEEN REFERRED TO A SPECIALIST, IT MAY TAKE 1-2 WEEKS TO SCHEDULE/PROCESS THE REFERRAL. IF YOU HAVE NOT HEARD FROM US/SPECIALIST IN TWO WEEKS, PLEASE GIVE [place] A CALL AT [phone].

## 2023-01-18 ENCOUNTER — Encounter: Payer: Self-pay | Admitting: Nurse Practitioner

## 2023-01-18 ENCOUNTER — Ambulatory Visit: Payer: Medicare Other

## 2023-01-18 ENCOUNTER — Ambulatory Visit: Payer: Medicare Other | Admitting: Nurse Practitioner

## 2023-01-18 VITALS — BP 102/60 | Temp 97.8°F | Ht 70.0 in | Wt 185.0 lb

## 2023-01-18 VITALS — BP 102/60 | HR 61 | Temp 97.8°F | Ht 70.0 in | Wt 185.8 lb

## 2023-01-18 DIAGNOSIS — G4733 Obstructive sleep apnea (adult) (pediatric): Secondary | ICD-10-CM | POA: Diagnosis not present

## 2023-01-18 DIAGNOSIS — E782 Mixed hyperlipidemia: Secondary | ICD-10-CM | POA: Diagnosis not present

## 2023-01-18 DIAGNOSIS — Z974 Presence of external hearing-aid: Secondary | ICD-10-CM | POA: Diagnosis not present

## 2023-01-18 DIAGNOSIS — I119 Hypertensive heart disease without heart failure: Secondary | ICD-10-CM | POA: Diagnosis not present

## 2023-01-18 DIAGNOSIS — I10 Essential (primary) hypertension: Secondary | ICD-10-CM

## 2023-01-18 DIAGNOSIS — Z6826 Body mass index (BMI) 26.0-26.9, adult: Secondary | ICD-10-CM

## 2023-01-18 DIAGNOSIS — E663 Overweight: Secondary | ICD-10-CM | POA: Diagnosis not present

## 2023-01-18 DIAGNOSIS — Z9989 Dependence on other enabling machines and devices: Secondary | ICD-10-CM

## 2023-01-18 DIAGNOSIS — Z0000 Encounter for general adult medical examination without abnormal findings: Secondary | ICD-10-CM | POA: Diagnosis not present

## 2023-01-18 DIAGNOSIS — Z2821 Immunization not carried out because of patient refusal: Secondary | ICD-10-CM | POA: Diagnosis not present

## 2023-01-18 DIAGNOSIS — R972 Elevated prostate specific antigen [PSA]: Secondary | ICD-10-CM

## 2023-01-18 NOTE — Assessment & Plan Note (Signed)
Continues to have an elevated PSA, seen by urology in August and is currently treating with Flomax.  At this time not considering biopsy or further evaluation other than routine follow-up.

## 2023-01-18 NOTE — Assessment & Plan Note (Signed)

## 2023-01-18 NOTE — Assessment & Plan Note (Signed)
Blood pressure is fairly controlled, recheck is... Continue current medications and follow-up with cardiologist.

## 2023-01-18 NOTE — Assessment & Plan Note (Signed)
Declines shingrix, educated on disease process and is aware if he changes his mind to notify office  

## 2023-01-18 NOTE — Patient Instructions (Addendum)
Mario Palmer , Thank you for taking time to come for your Medicare Wellness Visit. I appreciate your ongoing commitment to your health goals. Please review the following plan we discussed and let me know if I can assist you in the future.   Referrals/Orders/Follow-Ups/Clinician Recommendations: none  This is a list of the screening recommended for you and due dates:  Health Maintenance  Topic Date Due   COVID-19 Vaccine (3 - Pfizer risk series) 02/03/2023*   Zoster (Shingles) Vaccine (1 of 2) 04/20/2023*   Flu Shot  07/02/2023*   DTaP/Tdap/Td vaccine (2 - Td or Tdap) 01/18/2024*   Pneumonia Vaccine (1 of 1 - PCV) 01/18/2024*   Medicare Annual Wellness Visit  01/18/2024   HPV Vaccine  Aged Out  *Topic was postponed. The date shown is not the original due date.    Advanced directives: (Copy Requested) Please bring a copy of your health care power of attorney and living will to the office to be added to your chart at your convenience.  Next Medicare Annual Wellness Visit scheduled for next year: Yes,  insert Preventive Care attachment Insert FALL PREVENTION attachment if needed

## 2023-01-18 NOTE — Progress Notes (Signed)
Subjective:   Mario Palmer is a 82 y.o. male who presents for Medicare Annual/Subsequent preventive examination.  Visit Complete: In person    Cardiac Risk Factors include: advanced age (>67men, >43 women);dyslipidemia;hypertension;male gender     Objective:    Today's Vitals   01/18/23 1200 01/18/23 1201 01/18/23 1213  BP: 130/80  102/60  Temp: 97.8 F (36.6 C)    TempSrc: Oral    Weight: 185 lb (83.9 kg)    Height: 5\' 10"  (1.778 m)    PainSc:  6     Body mass index is 26.54 kg/m.     01/18/2023   12:06 PM 06/22/2021    4:07 PM 02/03/2021   10:32 AM 08/06/2019    9:16 AM 12/07/2018   10:42 AM 12/03/2018   10:29 AM 11/28/2018    7:53 AM  Advanced Directives  Does Patient Have a Medical Advance Directive? Yes Yes Yes Yes  Yes Yes  Type of Estate agent of [place];Living will Healthcare Power of [place];Living will Healthcare Power of [place];Living will Healthcare Power of [place];Living will Healthcare Power of [place];Living will Healthcare Power of [place];Living will Healthcare Power of [place];Living will  Copy of Healthcare Power of Attorney in Chart? No - copy requested No - copy requested No - copy requested No - copy requested  No - copy requested, Physician notified No - copy requested    Current Medications (verified) Outpatient Encounter Medications as of 01/18/2023  Medication Sig   amiodarone (PACERONE) 200 MG tablet Take 1 tablet (200 mg total) by mouth daily.   apixaban (ELIQUIS) 2.5 MG TABS tablet TAKE 1 TABLET TWICE A DAY   atorvastatin (LIPITOR) 20 MG tablet TAKE ONE TABLET BY MOUTH ONE TIME DAILY   Cholecalciferol (VITAMIN D) 2000 UNITS tablet Take 2,000 Units by mouth daily.   diltiazem (CARTIA XT) 120 MG 24 hr capsule TAKE ONE CAPSULE BY MOUTH DAILY AT BEDTIME   lisinopril (ZESTRIL) 40 MG tablet TAKE ONE TABLET BY MOUTH ONE TIME DAILY   Multiple Vitamins-Minerals (MULTIVITAMIN ADULT PO) Take 1 tablet by mouth daily.    nebivolol (BYSTOLIC) 10 MG tablet Take 0.5 tablets (5 mg total) by mouth daily.   NON FORMULARY CPAP therapy   Saccharomyces boulardii (PROBIOTIC) 250 MG CAPS Take 250 mg by mouth daily.   spironolactone (ALDACTONE) 25 MG tablet TAKE 1 TABLET ONCE DAILY   tamsulosin (FLOMAX) 0.4 MG CAPS capsule Take 0.4 mg by mouth daily.   No facility-administered encounter medications on file as of 01/18/2023.    Allergies (verified) Patient has no known allergies.   History: Past Medical History:  Diagnosis Date   Arthritis    Cancer (HCC)    skin cancer   Dysrhythmia    afib   GERD (gastroesophageal reflux disease)    High cholesterol    History of kidney stones    Hx of echocardiogram 12/2010   EF 55%, There is Stage 1 diastolic dyfunction. Normal LV filling pressure. Aortic sclerosis with mild AI, mild TR   Hypertension    Palpitation    with ecotopy   Sleep apnea    Past Surgical History:  Procedure Laterality Date   CARDIOVERSION N/A 09/12/2016   Procedure: CARDIOVERSION;  Surgeon: Lennette Bihari, MD;  Location: [facility] ENDOSCOPY;  Service: Cardiovascular;  Laterality: N/A;   CARDIOVERSION N/A 01/02/2023   Procedure: CARDIOVERSION;  Surgeon: Maisie Fus, MD;  Location: [facility] INVASIVE CV LAB;  Service: Cardiovascular;  Laterality: N/A;   EYE SURGERY  Bilateral    cataract   INGUINAL HERNIA REPAIR Left 11/28/2018   Procedure: LAPAROSCOPIC  LEFT INGUINAL HERNIA WITH MESH;  Surgeon: Abigail Miyamoto, MD;  Location: [facility] OR;  Service: General;  Laterality: Left;   INGUINAL HERNIA REPAIR Right 02/10/2021   Procedure: HERNIA REPAIR RIGHT INGUINAL;  Surgeon: Abigail Miyamoto, MD;  Location: [facility] ORS;  Service: General;  Laterality: Right;   MOHS SURGERY Left    skin cancer remover from left side   RETINAL DETACHMENT SURGERY Right    RETINAL LASER PROCEDURE Right 04/2018   SKIN CANCER EXCISION  07/2018   TONSILLECTOMY     UMBILICAL HERNIA REPAIR N/A 11/28/2018   Procedure: OPEN UMBILICAL HERNIA  REPAIR WITH MESH;  Surgeon: Abigail Miyamoto, MD;  Location: [facility] OR;  Service: General;  Laterality: N/A;   History reviewed. No pertinent family history. Social History   Socioeconomic History   Marital status: Married    Spouse name: Not on file   Number of children: Not on file   Years of education: Not on file   Highest education level: Not on file  Occupational History   Occupation: retired  Tobacco Use   Smoking status: Former    Types: Cigarettes   Smokeless tobacco: Never   Tobacco comments:    quit smoking in 1982  Vaping Use   Vaping status: Never Used  Substance and Sexual Activity   Alcohol use: No   Drug use: No   Sexual activity: Yes  Other Topics Concern   Not on file  Social History Narrative   Not on file   Social Determinants of Health   Financial Resource Strain: Low Risk  (01/18/2023)   Overall Financial Resource Strain (CARDIA)    Difficulty of Paying Living Expenses: Not hard at all  Food Insecurity: No Food Insecurity (01/18/2023)   Hunger Vital Sign    Worried About Running Out of Food in the Last Year: Never true    Ran Out of Food in the Last Year: Never true  Transportation Needs: No Transportation Needs (01/18/2023)   PRAPARE - Administrator, Civil Service (Medical): No    Lack of Transportation (Non-Medical): No  Physical Activity: Sufficiently Active (01/18/2023)   Exercise Vital Sign    Days of Exercise per Week: 7 days    Minutes of Exercise per Session: 60 min  Stress: No Stress Concern Present (01/18/2023)   [employer] of Occupational Health - Occupational Stress Questionnaire    Feeling of Stress : Not at all  Social Connections: Moderately Integrated (01/18/2023)   Social Connection and Isolation Panel [NHANES]    Frequency of Communication with Friends and Family: Three times a week    Frequency of Social Gatherings with Friends and Family: Three times a week    Attends Religious Services: More than 4 times  per year    Active Member of Clubs or Organizations: No    Attends Banker Meetings: Never    Marital Status: Married    Tobacco Counseling Counseling given: Not Answered Tobacco comments: quit smoking in 1982   Clinical Intake:  Pre-visit preparation completed: Yes  Pain : 0-10 Pain Score: 6  Pain Type: Chronic pain Pain Location: Knee Pain Orientation: Left, Right Pain Descriptors / Indicators: Aching Pain Onset: More than a month ago Pain Frequency: Constant     Nutritional Risks: None Diabetes: No  How often do you need to have someone help you when you read instructions, pamphlets, or other written  materials from your doctor or pharmacy?: 1 - Never  Interpreter Needed?: No  Information entered by :: NAllen LPN   Activities of Daily Living    01/18/2023   12:02 PM 01/02/2023    8:41 AM  In your present state of health, do you have any difficulty performing the following activities:  Hearing? 1 1  Comment wears hearing aids wearing hearing aids  Vision? 0 0  Difficulty concentrating or making decisions? 0 0  Walking or climbing stairs? 1   Comment due to knees   Dressing or bathing? 0   Doing errands, shopping? 0   Preparing Food and eating ? N   Using the Toilet? N   In the past six months, have you accidently leaked urine? N   Do you have problems with loss of bowel control? N   Managing your Medications? N   Managing your Finances? N   Housekeeping or managing your Housekeeping? N     Patient Care Team: [name], FNP as PCP - General (General Practice) Lennette Bihari, MD as PCP - Cardiology (Cardiology) Ernesto Rutherford, MD as Consulting Physician (Ophthalmology) Luciana Axe Alford Highland, MD as Consulting Physician (Ophthalmology)  Indicate any recent Medical Services you may have received from other than [facility] providers in the past year (date may be approximate).     Assessment:   This is a routine wellness examination for  Mario Palmer.  Hearing/Vision screen Hearing Screening - Comments:: Has hearing aids that are mantained Vision Screening - Comments:: Regular eye exams, Dr. Luciana Axe   Goals Addressed             This Visit's Progress    Patient Stated       01/18/2023, eat healthier and drink more water       Depression Screen    01/18/2023   12:08 PM 01/18/2023   11:45 AM 12/14/2021   10:35 AM 06/22/2021    4:08 PM 06/08/2021   10:05 AM 08/06/2019    9:16 AM 08/01/2018   10:15 AM  PHQ 2/9 Scores  PHQ - 2 Score 0 0 0 0 0 0 0  PHQ- 9 Score 0 0 0   0 0    Fall Risk    01/18/2023   12:07 PM 01/18/2023   11:45 AM 12/14/2021   10:34 AM 06/22/2021    4:08 PM 06/08/2021   10:05 AM  Fall Risk   Falls in the past year? 0 0  0 0  Number falls in past yr: 0 0 0  0  Injury with Fall? 0 0 0  0  Risk for fall due to : Medication side effect No Fall Risks No Fall Risks Medication side effect No Fall Risks  Follow up Falls prevention discussed;Falls evaluation completed Falls evaluation completed Falls evaluation completed Falls evaluation completed;Education provided;Falls prevention discussed Falls evaluation completed    MEDICARE RISK AT HOME: Medicare Risk at Home Any stairs in or around the home?: Yes If so, are there any without handrails?: No Home free of loose throw rugs in walkways, pet beds, electrical cords, etc?: Yes Adequate lighting in your home to reduce risk of falls?: Yes Life alert?: No Use of a cane, walker or w/c?: No Grab bars in the bathroom?: No Shower chair or bench in shower?: No Elevated toilet seat or a handicapped toilet?: No  TIMED UP AND GO:  Was the test performed?  Yes  Length of time to ambulate 10 feet: 5 sec Gait steady and fast  without use of assistive device    Cognitive Function:        01/18/2023   12:08 PM 06/22/2021    4:09 PM 08/06/2019    9:19 AM 08/01/2018   10:19 AM  6CIT Screen  What Year? 0 points 0 points 0 points 0 points  What month? 0 points  0 points 0 points 0 points  What time? 0 points 0 points 0 points 0 points  Count back from 20 0 points 0 points 0 points 0 points  Months in reverse 2 points 0 points 0 points 0 points  Repeat phrase 2 points 0 points 0 points 0 points  Total Score 4 points 0 points 0 points 0 points    Immunizations Immunization History  Administered Date(s) Administered   PFIZER(Purple Top)SARS-COV-2 Vaccination 06/16/2019, 07/07/2019   Tdap 06/03/2011    TDAP status: Due, Education has been provided regarding the importance of this vaccine. Advised may receive this vaccine at local pharmacy or Health Dept. Aware to provide a copy of the vaccination record if obtained from local pharmacy or Health Dept. Verbalized acceptance and understanding.  Flu Vaccine status: Declined, Education has been provided regarding the importance of this vaccine but patient still declined. Advised may receive this vaccine at local pharmacy or Health Dept. Aware to provide a copy of the vaccination record if obtained from local pharmacy or Health Dept. Verbalized acceptance and understanding.  Pneumococcal vaccine status: Declined,  Education has been provided regarding the importance of this vaccine but patient still declined. Advised may receive this vaccine at local pharmacy or Health Dept. Aware to provide a copy of the vaccination record if obtained from local pharmacy or Health Dept. Verbalized acceptance and understanding.   Covid-19 vaccine status: Information provided on how to obtain vaccines.   Qualifies for Shingles Vaccine? Yes   Zostavax completed No   Shingrix Completed?: No.    Education has been provided regarding the importance of this vaccine. Patient has been advised to call insurance company to determine out of pocket expense if they have not yet received this vaccine. Advised may also receive vaccine at local pharmacy or Health Dept. Verbalized acceptance and understanding.  Screening Tests Health  Maintenance  Topic Date Due   COVID-19 Vaccine (3 - Pfizer risk series) 02/03/2023 (Originally 08/04/2019)   Zoster Vaccines- Shingrix (1 of 2) 04/20/2023 (Originally 12/24/1959)   INFLUENZA VACCINE  07/02/2023 (Originally 11/02/2022)   DTaP/Tdap/Td (2 - Td or Tdap) 01/18/2024 (Originally 06/02/2021)   Pneumonia Vaccine 61+ Years old (1 of 1 - PCV) 01/18/2024 (Originally 12/25/2003)   Medicare Annual Wellness (AWV)  01/18/2024   HPV VACCINES  Aged Out    Health Maintenance  There are no preventive care reminders to display for this patient.   Colorectal cancer screening: No longer required.   Lung Cancer Screening: (Low Dose CT Chest recommended if Age 47-80 years, 20 pack-year currently smoking OR have quit w/in 15years.) does not qualify.   Lung Cancer Screening Referral: no  Additional Screening:  Hepatitis C Screening: does not qualify;   Vision Screening: Recommended annual ophthalmology exams for early detection of glaucoma and other disorders of the eye. Is the patient up to date with their annual eye exam?  Yes  Who is the provider or what is the name of the office in which the patient attends annual eye exams? DR. Rankin If pt is not established with a provider, would they like to be referred to a provider to establish care? No .  Dental Screening: Recommended annual dental exams for proper oral hygiene  Diabetic Foot Exam: n/a  Community Resource Referral / Chronic Care Management: CRR required this visit?  No   CCM required this visit?  No     Plan:     I have personally reviewed and noted the following in the patient's chart:   Medical and social history Use of alcohol, tobacco or illicit drugs  Current medications and supplements including opioid prescriptions. Patient is not currently taking opioid prescriptions. Functional ability and status Nutritional status Physical activity Advanced directives List of other physicians Hospitalizations, surgeries, and  ER visits in previous 12 months Vitals Screenings to include cognitive, depression, and falls Referrals and appointments  In addition, I have reviewed and discussed with patient certain preventive protocols, quality metrics, and best practice recommendations. A written personalized care plan for preventive services as well as general preventive health recommendations were provided to patient.     Barb Merino, LPN   [id number]/01/[id number]   After Visit Summary: (In Person-Printed) AVS printed and given to the patient  Nurse Notes: none

## 2023-01-18 NOTE — Assessment & Plan Note (Signed)
Continue using CPAP, tolerating well and feels like it is beneficial for better quality of life.

## 2023-01-18 NOTE — Assessment & Plan Note (Deleted)
Cholesterol levels are normal.  Continue statin, tolerating well.

## 2023-01-18 NOTE — Assessment & Plan Note (Signed)
Cholesterol levels are normal.  Continue statin, tolerating well.

## 2023-01-21 NOTE — Progress Notes (Unsigned)
Patient ID: Mario Palmer, male   DOB: 03-31-1941, 82 y.o.   MRN: [id number]        Primary M.D.: Dr. Velna Hatchet  HPI: Mario Palmer is a 82 y.o. male who presents for a 4 month follow-up cardiology/ sleep evaluation.  [name] has a history of hypertension, obstructive sleep apnea on CPAP therapy, GERD, and palpitations with atrial ectopy,. 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 He has history of hyperlipidemia and has been on simvastatin and had intermittently stopped this secondary to arthralgias.  An echo Doppler study in January 2015 revealed a normal systolic function with ejection fraction of 55-60%. There was evidence for mild aortic and mitral insufficiency. His left atrium was moderately dilated.  [name] was diagnosed with obstructive sleep apnea in 2010. He had mild sleep apnea with events worse in the supine position and during REM sleep. He has been on CPAP therapy since that time and initially he was started on an 8 cm water pressure but also this was later titrated to 10 cm.  He admits to 100% compliance.  His initial machine malfunctioned in 2017 and  He received a new AirSense 10 CPAP unit.  He has noticed significant improvement in this new machine.  Compared to his old one.  He admits to 100% compliance.  He goes to bed between 9:30 9:45 AM and wakes up once around 2:30 to the bathroom and typically sleeps 9-10 hours per night.  A download was obtained from his new machine from 05/07/2015 through 06/05/2015.  This revealed 100% compliance.  He is averaging 9 hours and 53 minutes per night.  He is set at a 10 cm water pressure.  His AHI was excellent at 1.1.  He uses a fullface mask.  There was no leak.    When I saw him in March 2018 his ECG demonstrated atrial fibrillation.  He was in this of questionable duration.  At that time, I initiated anticoagulation witheliquis  5 mg twice a day.  I discontinued amlodipine and started Cardizem CD 180 mg.   He underwent an echo Doppler study on 06/23/2016 which showed an EF of 55-60%.  He did not have wall motion abnormalities.  There was mild aortic sclerosis with mildAR and mild focal calcification of the anterior mitral valve leaflet with trivial MR.  His right ventricle is mildly dilated.  His left atrium was severely dilated and measured 64 mm parasternally.  I initiated therapy with amiodarone as an antiarrhythmic agent, with the greatest likelihood for pharmacologic success.  I decrease Bystolic to 5 mg.  He started amiodarone at 200 mg first week and ultimately titrated this to 200 mg twice a day.  He has felt improved with control his ventricular rate.  He notices that his blood pressure is slightly increased on the reduced dose of Bystolic.  He admitted to 100% compliance with CPAP therapy.  He underwent successful DC cardioversion by me on 08/18/2016 with recurrence of sinus rhythm.  He has noticed improved energy since restoration of sinus rhythm.  He is unaware of any significant ectopy but does note a rare palpitation.  He has been exercising and pedals on a stationary bike for at least 40 minutes without chest pain or shortness of breath.  When I saw him in September 2018 he was unaware of recurrent atrial fibrillation.  He does admit to occasional palpitations at night.  Apparently, he had been on Bystolic 10 mg but inadvertently when  he called his primary physician Bystolic was renewed at 5 mg instead of the 10 mg dose.  The palpitations seem to have occurred on the reduced dose.  I last saw him in December and resume bystolic at 10 mg.  This has improved his palpitations and blood pressure.  He continues to use CPAP with 100% compliance.   A download from August 21 through 12/20/2016 revealed 100% of use with an average sleep duration of 9 hours and 11 minutes.  His AHI is excellent at 1.7 on a 10 cm set pressure.  His amiodarone dose was reduced in December and he now is on 200 mg daily, in  addition to Cartia 240 mg, lisinopril 40 mg in addition to spironolactone 25 mg daily for hypertension.  He called the office with complaints of his tongue becoming exceedingly dry.  After several hours of CPAP use.  He has a fullface mask.  He breathes through his mouth and remotely had used a nasal mask was switched to fullface mask due to oral venting.  He had complained of a very dry mouth, making it difficult for him to continue to sleep.  He was worked in and seen as an add-on one month ago.  At that time, we readjusted his medication.  We had subsequent telephone conversations with additional adjustment.  He went to his DME company and they adjusted his tube temperature to 70.    He underwent colonoscopy and endoscopy by Dr. Kinnie Scales and was found to have 9 polyps.  Apparently the biopsy was benign.  When I last saw him early April 2019 he was continuing to have difficulty with his new CPAP machine.  Subsequently he saw [facility] as his DME company.  He was given a chinstrap.  Addition, he was advised by his daughter-in-law's dentist to use Somnifix to assist his mouth breathing.  He feels since implementing this mouth taped, he has noticed marked improvement in his sleep.  He is now sleeping at least 9 hours.  His sleep is restorative.  He has noticed his blood pressure now is significantly more controlled.  He brought with him blood pressure readings from home and his systolic blood pressures have ranged from in the low to mid 90s to the 120s.  He has been unaware of any recurrent episodes of atrial fibrillation.  He denies episodes of chest pain.  He continues to be on anticoagulation for PAF.  He had follow-up laboratory in April which showed a total cholesterol 188 triglycerides 190 LDL 93 HDL 57.  PSA was increased at 6.2.  Creatinine was 1.32.  He had previously seen Dr. Velna Hatchet for primary care but apparently her office is no longer taking [employer].    When I saw  him in August 2019, he was maintaining sinus rhythm on amiodarone 200 mg daily in addition to Bystolic 5 mg.  He also was on lisinopril 40 mg in addition to spironolactone for hypertension.  He had been on low-dose simvastatin and particularly with its interaction with Cardizem and an LDL of 93 I suggested he discontinue simvastatin and started him on atorvastatin 20 mg.  He tells me he had retinal surgery in January.  He had undergone excision of a squamous cell cancer from his abdomen and was told of clear margins.  He fell on his face after he tripped on his shoelace resulting in significant ecchymoses to his nose and under his eyes bilaterally.  He continues to use CPAP.  During that evaluation, I recommended he discontinue aspirin but continue Eliquis.  He had undergone  laparoscopic double hernia surgery on November 28, 2018 by Dr. Magnus Ivan.  He subsequently required hospitalization for development of a postoperative ileus.  When I saw him in September 2020 he denied any chest pain or awareness of any abnormal rhythm.  He did have some intermittent leg cramping at night.  He continues to be compliant with CPAP therapy.  During that evaluation with his renal insufficiency I suggested slight reduction of spironolactone to 12.5 mg daily.  He continues to be compliant with CPAP therapy.  I saw him for follow-up evaluation in July 2021 and over the prior 10 months he had remained stable and denied chest pain, PND, orthopnea.  His pulse rate had been in the 50s.  During that evaluation, ECG revealed sinus bradycardia and I recommended he reduce his amiodarone from 200 mg daily to 100 mg alternating with 200 mg every other day.  He had continued to use CPAP therapy and a download revealed 100% compliance with an AHI of 1.2/h at 10 cm water pressure. He has continued to be on anticoagulation.    I saw him in October 2021 at which time he continued to feel well and denied any orthostatic symptoms.  He continues to  be on diltiazem 120 mg at bedtime, lisinopril 40 mg daily in addition to spironolactone 25 mg daily for blood pressure control.  He was on atorvastatin for lipid therapy.  He continued to use CPAP therapy.  A new download was obtained from September 18 through January 18, 2020 which confirmed 100% compliance with average use at 7 hours and 17 minutes.  At 10 cm water pressure AHI is 2.5/h.  He was tolerating low-dose Eliquis due to his age and creatinine most recently 1.58.  I saw him on Aug 25, 2020 at which time he felt well.  He denied any chest pain, shortness of breath, or anginal symptoms.  He was unaware of any heart rate irregularity.  He continues to use CPAP with excellent compliance.  A download was obtained from March 15 through July 14, 2020 which shows 100% use with average use of 5 hours and 45 minutes.  His CPAP set up date was January 2017 and as result he has a 3G wireless system.  Unfortunately in mid April 2022 with [employer] upgrade, only 5G units are able to be continued to be downloadable wirelessly.  On his download at 10 cm set pressure AHI was 7.1.  He does not have a CPAP auto unit but has a ResMed air sense 10 CPAP device.  During that evaluation, his ECG showed sinus bradycardia at 57 bpm.  His machine was no longer while or less and he qualified for new machine.  In the interim I recommended changing his pressure setting from a set pressure of 10 to a set pressure of 20 cm of water since he did not have an auto mode CPAP unit.  I saw him on March 03, 2022.  He continued to feel well and was walking 2 miles at least 4 days/week on concrete.  He denies any chest pain or shortness of breath.  His blood pressure was stable.  His ECG showed sinus bradycardia at 53 bpm with first-degree AV block and PR interval 218 ms.  He is still using his old CPAP machine.  He brought this with him today and we were able to obtain a download off his card.  At  12 cm, AHI is 1.0.  He typically goes to bed  around 9:30 PM and wakes up around 7.  However CPAP usage was only 4 hours and 38 minutes per night.  He is followed by urology with elevated PSA.  He is unaware of any recurrent rhythm disturbance.  He qualified for a new machine, and an order was placed for him to receive a ResMed AirSense 11 AutoSet unit.  I saw him on July 12, 2022.  [name] received a new ResMed AirSense 11 AutoSet unit with set up date on May 19, 2022 and [employer] as his DME company.  A download was obtained from February 16 through June 17, 2022.  Compliance is excellent with 100% use.  Average use is 9 hours and 4 minutes.  At a pressure range of 11 to 16 cm of water, AHI was excellent at 1.41 events per hour.  95th percentile pressure was 13.9 with maximum average pressure at 15.1.  During that evaluation, he felt well and was unaware of any rhythm disturbance.  He denied any shortness of breath or awareness of palpitations.  His ECG at that time, however showed that he was in atrial flutter at 67 bpm which appeared to be predominantly 4-1.  His blood pressure was stable on Cartia XT 120 mg daily, lisinopril 40 mg, nebivolol 5 mg, and spironolactone 25 mg.  He continued to be on low-dose apixaban 2.5 mg twice a day with recent creatinine at 1.51.  At that time, I recommended he initiate amiodarone 200 mg twice a day for 1 week and then decrease to 200 mg daily with hopes that this would convert him to sinus rhythm.  He also was being followed by urology for PSA elevation.  I last saw him on November 20, 2022 at which time he continued to feel well.   He is walking at least 4 to 5 days/week at least for 2 miles and also uses a stationary bike.  He denies chest pain or shortness of breath.  He continues to use CPAP with 100% compliance.  A download was obtained from July 21 through November 20, 2022.  Average use was 7 hours and 53 minutes.  At a pressure range of 11 to 16 cm, AHI is excellent at 1.6.  He had undergone a 2D echo  Doppler study on Aug 11, 2022 which showed normal LV function with EF 55 to 60%.  He had severe left atrial dilatation, mild MR, and mild aortic sclerosis without stenosis.  ECG at that office visit showed atrial flutter with variable block at 61 bpm with incomplete right bundle branch block.  During that evaluation, he was completely asymptomatic.  He is continuing to use CPAP therapy and AHI was 1.6 with his 95th percentile pressure 14.4 with maximum average of 15.4.  Since he was still in atrial flutter, I recommended he increase amiodarone back to 200 mg a day and have follow-up office visit with the atrial fibrillation clinic.    He was seen in A-fib clinic December 19, 2022 still in atrial flutter.  He underwent successful DC cardioversion on January 02, 2023 performed by Dr. Wyline Mood with restoration of sinus rhythm following 1 synchronized biphasic 120 J shock.  Presently, [name] feels well.  He is entirely asymptomatic.  He is now on amiodarone 200 mg daily in addition to Eliquis 2.5 mg twice a day.  He continues to be on lisinopril 40 mg, spironolactone 25 mg, nevibilol 5 mg and [place]  XT 120 mg for blood pressure and heart rate control.  He currently is walking 2 miles at least 4 to 5 days/week and also works out on a stationary bike.  He continues to use CPAP therapy.  A download was obtained today which shows excellent compliance.  His ResMed AirSense 11 AutoSet unit is set at a pressure range of 11 to 16 cm.  AHI is 2.8.  95th percentile pressure is 14.7 with maximum average pressure 15.3.  He does have issues with mouth breathing and has been using some effects bandage on his mouth but still by the morning his mouth is open.  He presents for evaluation.          Past Medical History:  Diagnosis Date   Arthritis    Cancer (HCC)    skin cancer   Dysrhythmia    afib   GERD (gastroesophageal reflux disease)    High cholesterol    History of kidney stones    Hx of echocardiogram  12/2010   EF 55%, There is Stage 1 diastolic dyfunction. Normal LV filling pressure. Aortic sclerosis with mild AI, mild TR   Hypertension    Palpitation    with ecotopy   Sleep apnea     Past Surgical History:  Procedure Laterality Date   CARDIOVERSION N/A 09/12/2016   Procedure: CARDIOVERSION;  Surgeon: Lennette Bihari, MD;  Location: [facility] ENDOSCOPY;  Service: Cardiovascular;  Laterality: N/A;   CARDIOVERSION N/A 01/02/2023   Procedure: CARDIOVERSION;  Surgeon: Maisie Fus, MD;  Location: [facility] INVASIVE CV LAB;  Service: Cardiovascular;  Laterality: N/A;   EYE SURGERY Bilateral    cataract   INGUINAL HERNIA REPAIR Left 11/28/2018   Procedure: LAPAROSCOPIC  LEFT INGUINAL HERNIA WITH MESH;  Surgeon: Abigail Miyamoto, MD;  Location: [facility] OR;  Service: General;  Laterality: Left;   INGUINAL HERNIA REPAIR Right 02/10/2021   Procedure: HERNIA REPAIR RIGHT INGUINAL;  Surgeon: Abigail Miyamoto, MD;  Location: [facility] ORS;  Service: General;  Laterality: Right;   MOHS SURGERY Left    skin cancer remover from left side   RETINAL DETACHMENT SURGERY Right    RETINAL LASER PROCEDURE Right 04/2018   SKIN CANCER EXCISION  07/2018   TONSILLECTOMY     UMBILICAL HERNIA REPAIR N/A 11/28/2018   Procedure: OPEN UMBILICAL HERNIA REPAIR WITH MESH;  Surgeon: Abigail Miyamoto, MD;  Location: [facility] OR;  Service: General;  Laterality: N/A;    No Known Allergies  Current Outpatient Medications  Medication Sig Dispense Refill   amiodarone (PACERONE) 200 MG tablet Take 1 tablet (200 mg total) by mouth daily. 90 tablet 2   apixaban (ELIQUIS) 2.5 MG TABS tablet TAKE 1 TABLET TWICE A DAY 60 tablet 5   atorvastatin (LIPITOR) 20 MG tablet TAKE ONE TABLET BY MOUTH ONE TIME DAILY 90 tablet 3   Cholecalciferol (VITAMIN D) 2000 UNITS tablet Take 2,000 Units by mouth daily.     diltiazem (CARTIA XT) 120 MG 24 hr capsule TAKE ONE CAPSULE BY MOUTH DAILY AT BEDTIME 90 capsule 3   lisinopril (ZESTRIL) 40 MG tablet TAKE ONE TABLET  BY MOUTH ONE TIME DAILY 90 tablet 3   Multiple Vitamins-Minerals (MULTIVITAMIN ADULT PO) Take 1 tablet by mouth daily.     nebivolol (BYSTOLIC) 10 MG tablet Take 0.5 tablets (5 mg total) by mouth daily. 90 tablet 3   Saccharomyces boulardii (PROBIOTIC) 250 MG CAPS Take 250 mg by mouth daily.     spironolactone (ALDACTONE) 25 MG tablet TAKE 1 TABLET  ONCE DAILY 30 tablet 2   tamsulosin (FLOMAX) 0.4 MG CAPS capsule Take 0.4 mg by mouth daily.     NON FORMULARY CPAP therapy     No current facility-administered medications for this visit.    Social History   Socioeconomic History   Marital status: Married    Spouse name: Not on file   Number of children: Not on file   Years of education: Not on file   Highest education level: Not on file  Occupational History   Occupation: retired  Tobacco Use   Smoking status: Former    Types: Cigarettes   Smokeless tobacco: Never   Tobacco comments:    quit smoking in 1982  Vaping Use   Vaping status: Never Used  Substance and Sexual Activity   Alcohol use: No   Drug use: No   Sexual activity: Yes  Other Topics Concern   Not on file  Social History Narrative   Not on file   Social Determinants of Health   Financial Resource Strain: Low Risk  (01/18/2023)   Overall Financial Resource Strain (CARDIA)    Difficulty of Paying Living Expenses: Not hard at all  Food Insecurity: No Food Insecurity (01/18/2023)   Hunger Vital Sign    Worried About Running Out of Food in the Last Year: Never true    Ran Out of Food in the Last Year: Never true  Transportation Needs: No Transportation Needs (01/18/2023)   PRAPARE - Administrator, Civil Service (Medical): No    Lack of Transportation (Non-Medical): No  Physical Activity: Sufficiently Active (01/18/2023)   Exercise Vital Sign    Days of Exercise per Week: 7 days    Minutes of Exercise per Session: 60 min  Stress: No Stress Concern Present (01/18/2023)   [employer] of  Occupational Health - Occupational Stress Questionnaire    Feeling of Stress : Not at all  Social Connections: Moderately Integrated (01/18/2023)   Social Connection and Isolation Panel [NHANES]    Frequency of Communication with Friends and Family: Three times a week    Frequency of Social Gatherings with Friends and Family: Three times a week    Attends Religious Services: More than 4 times per year    Active Member of Clubs or Organizations: No    Attends Banker Meetings: Never    Marital Status: Married  Catering manager Violence: Not At Risk (01/18/2023)   Humiliation, Afraid, Rape, and Kick questionnaire    Fear of Current or Ex-Partner: No    Emotionally Abused: No    Physically Abused: No    Sexually Abused: No    No family history on file.  Both parents are deceased.  Social history is notable in that he is married; 2 children and 2 grandchildren. 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 There is no tobacco or alcohol use.  ROS General: Negative; No fevers, chills, or night sweats;  HEENT: Negative; No changes in vision or hearing, sinus congestion, difficulty swallowing Pulmonary: Negative; No cough, wheezing, shortness of breath, hemoptysis Cardiovascular:  see HPI GI: Negative; No nausea, vomiting, diarrhea, or abdominal pain GU: PSA elevation, followed by urology  Musculoskeletal: Negative; no myalgias, joint pain, or weakness Hematologic/Oncology: Negative; no easy bruising, bleeding Endocrine: Negative; no heat/cold intolerance; no diabetes Neuro: Negative; no changes in balance, headaches Skin: Negative; No rashes or skin lesions Psychiatric: Negative; No behavioral problems, depression Sleep: Positive for obstructive sleep apnea on CPAP therapy.  Occasional leg cramps at night the urge to move without definitive restless leg syndrome; no snoring, daytime sleepiness, hypersomnolence, bruxism, hypnogognic hallucinations, no cataplexy Other  comprehensive 14 point system review is negative.  PE: BP 112/70 (BP Location: Left Arm, Patient Position: Sitting, Cuff Size: Normal)   Pulse (!) 58   Ht 5\' 10"  (1.778 m)   Wt 186 lb 12.8 oz (84.7 kg)   SpO2 99%   BMI 26.80 kg/m    Repeat blood pressure by me was 118/70  Wt Readings from Last 3 Encounters:  09/22/16 192 lb (87.1 kg)  09/12/16 198 lb (89.8 kg)  08/18/16 194 lb (88 kg)    General: Alert, oriented, no distress.  Appears younger than stated age Skin: normal turgor, no rashes, warm and dry HEENT: Normocephalic, atraumatic. Pupils equal round and reactive to light; sclera anicteric; extraocular muscles intact;  Nose without nasal septal hypertrophy Mouth/Parynx benign; Mallinpatti scale 3 Neck: No JVD, no carotid bruits; normal carotid upstroke Lungs: clear to ausculatation and percussion; no wheezing or rales Chest wall: without tenderness to palpitation Heart: PMI not displaced, regular rhythm, bradycardic in the mid 50s, s1 s2 normal, 1/6 systolic murmur, no diastolic murmur, no rubs, gallops, thrills, or heaves Abdomen: soft, nontender; no hepatosplenomehaly, BS+; abdominal aorta nontender and not dilated by palpation. Back: no CVA tenderness Pulses 2+ Musculoskeletal: full range of motion, normal strength, no joint deformities Extremities: no clubbing cyanosis or edema, Homan's sign negative  Neurologic: grossly nonfocal; Cranial nerves grossly wnl Psychologic: Normal mood and affect  EKG Interpretation Date/Time:  Monday January 22 2023 12:49:10 EDT Ventricular Rate:  54 PR Interval:  232 QRS Duration:  108 QT Interval:  448 QTC Calculation: 424 R Axis:   -28  Text Interpretation: Sinus bradycardia with 1st degree A-V block When compared with ECG of 02-Jan-2023 09:30, No significant change was found Confirmed by Nicki Guadalajara ([id number]) on 01/22/2023 1:05:27 PM    July 12, 2022 ECG (independently read by me): Atrial flutter at 67; QTc 435 msec    March 03, 2022 ECG (independently read by me): Sinus bradycardia at 53, 1st degree AV bl;ock; PR 218 msec  Aug 25, 2020 ECG (independently read by me): Sinus bradycardia at 57, borderline 1st degree AV block    October 2021 ECG (independently read by me): Sinus Bradycardia at 54; 1st degree AV block; PR 210 msec; QTc 440 msec  October 13, 2019 ECG (independently read by me): Sinus bradycardia at 51 bpm, first-degree AV block with PR interval 226 ms.Marland Kitchen  QTc interval 440 ms.  December 30, 2018 ECG (independently read by me): Sinus bradycardia at 55 bpm.  First-degree AV block with a.PR interval 230 msec.  Early transition.  March 2020ECG (independently read by me): Normal sinus rhythm at 62 bpm.  PR interval 200 ms.  QTc interval 444 ms.  No ectopy.  November 28, 2017 ECG (independently read by me): Sinus Bradycardia at 51, degree AV block.  No ectopy.  April 2019 ECG (independently read by me): Sinus bradycardia at 49 bpm with mild sinus arrhythmia.  First-degree AV block with a paratubal 2 to 2 ms.  No ST segment changes.  06/01/2017 ECG (independently read by me):Sinus bradycardia 58 bpm.  First-degree AV block.  05/11/2017 ECG (independently read by me): Sinus bradycardia at 67 bpm.  First degree AV block with a PR interval 214 ms.  QTC normal 434 ms.  December 2018 ECG (independently read by me):  Normal sinus rhythm at 63 bpm. PR  interval .  QTc nterval 466 ms. No significant ST-T changes.  September 2018 ECG (independently read by me): Sinus bradycardia 52 bpm.  QTC increased at 487 ms.  No CMV ST-T change.  June 2018 ECG (independently read by me): Normal sinus rhythm at 62 bpm.  Left axis deviation.  QTc interval 479 ms.  No significant ST-T changes  May 2018 ECG (independently read by me): atrial flib  /flutter with coarse laboratory wave.  07/13/2016 ECG (independently read by me): Atrial fibrillation with a ventricular rate in the 50s.  Mild RV conduction delay.  March 2018  ECG (independently read by me): Atrial fibrillation at 63 bpm.  QTc interval 427 ms.  March 2016 ECG (independently read by me): Sinus bradycardia 59 bpm.  Mild RV conduction delay.  Normal intervals.  March 2015 ECG: Normal sinus rhythm at 60 beats per minute. No ectopy. Nonspecific T changes in lead 3.  02/04/2013 ECG: Sinus bradycardia with PACs.  In the office today I obtained a new download from October 29, 2017 through November 27, 2017.  During this time he has been using some to fix on a daily basis.  Compliance is 100%.  He is averaging 9 hours and 19 minutes of sleep per night.  At a 10 cm set pressure, AHI is excellent at 1.2.  There is no leak.  LABS:     Latest Ref Rng & Units 12/19/2022    9:52 AM 07/12/2022   10:27 AM 12/14/2021   11:09 AM  BMP  Glucose 70 - 99 mg/dL 88  93  84   BUN 8 - 23 mg/dL 30  25  28    Creatinine 0.61 - 1.24 mg/dL 7.82  9.56  2.13   BUN/Creat Ratio 10 - 24  17  19    Sodium 135 - 145 mmol/L 139  144  142   Potassium 3.5 - 5.1 mmol/L 5.0  5.0  5.0   Chloride 98 - 111 mmol/L 106  105  106   CO2 22 - 32 mmol/L 25  23  22    Calcium 8.9 - 10.3 mg/dL 9.5  08.6  9.9        Latest Ref Rng & Units 12/19/2022    9:52 AM 07/12/2022   10:27 AM 12/14/2021   11:09 AM  Hepatic Function  Total Protein 6.5 - 8.1 g/dL 6.7  7.0  6.7   Albumin 3.5 - 5.0 g/dL 3.8  4.4  4.2   AST 15 - 41 U/L 29  21  18    ALT 0 - 44 U/L 36  25  16   Alk Phosphatase 38 - 126 U/L 86  87  70   Total Bilirubin 0.3 - 1.2 mg/dL 1.0  0.8  0.6       Latest Ref Rng & Units 12/19/2022    9:52 AM 07/12/2022   10:27 AM 12/14/2021   11:09 AM  CBC  WBC 4.0 - 10.5 K/uL 7.6  7.7  7.9   Hemoglobin 13.0 - 17.0 g/dL 57.8  46.9  62.9   Hematocrit 39.0 - 52.0 % 43.3  45.1  40.6   Platelets 150 - 400 K/uL 229  225  215    Lab Results  Component Value Date   MCV 97.5 12/19/2022   MCV 95 07/12/2022   MCV 93 12/14/2021   Lab Results  Component Value Date   TSH 2.826 12/19/2022     BNP No  results found for: "PROBNP"  Lipid Panel  Component Value Date/Time   CHOL 156 07/12/2022 1027   TRIG 88 07/12/2022 1027   HDL 59 07/12/2022 1027   CHOLHDL 2.6 07/12/2022 1027   CHOLHDL 3.0 06/07/2016 1030   VLDL 28 06/07/2016 1030   LDLCALC 81 07/12/2022 1027    RADIOLOGY: No results found.  IMPRESSION:  1. CAD in native artery   2. Hx of CABG   3. OSA on CPAP   4. Atrial flutter, unspecified type [facility]): s/p cardioversion 01/02/2023   5. Essential hypertension   6. Mixed hyperlipidemia      ASSESSMENT AND PLAN: Mr. Jacai Cossairt is a young appearing 82 year-old  gentleman who has a history of hypertension, obstructive sleep apnea, palpitations with remote atrial flutter/atrial fibrillation and hyperlipidemia.  He developed atrial fibrillation/flutter and after titration of amiodarone underwent successful DC cardioversion on 08/18/2016 with resolution of atrial flutter and restoration of sinus rhythm.  He has been on chronic eliquis for anticoagulation and denies bleeding.  Over the years his dose of amiodarone has been reduced and when seen in May 2022 he was changed to 100 mg alternating with 200 mg.  Recently, he has been on amiodarone 100 mg daily and reduced dose Eliquis to 2.5 mg twice a day due to his age greater than 80 and creatinine at 1.51.  When I saw him on July 12, 2022 after he received a new CPAP machine, he was compliant with CPAP therapy and had excellent AHI.  He was completely asymptomatic from a cardiovascular standpoint but ECG showed that he was back in atrial flutter with 4:1 block.  At that time I further titrated amiodarone to 200 mg twice a day for the next week and then recommended he take 200 mg daily.  At follow-up 19 2024 he continued to be in atrial flutter.  He remained completely asymptomatic and was walking at least 4 to 5 days/week for up to 2 miles and does stationary bike additional exercise as well as some mild resistance training.  Although  he is completely asymptomatic, repeat ECG today confirmed that  atrial flutter with mild variable block predominantly 4-1.  A new download of his CPAP shows excellent compliance and AHI is 1.6 with his 95th percentile pressure at 14.4 with maximum average pressure at 15.4.  At that time I recommended short-term increase of amiodarone back to 200 mg twice a day and scheduled him for A-fib clinic.  He subsequently underwent successful DC cardioversion on January 02, 2023 and has been maintaining sinus rhythm.  ECG today confirms sinus bradycardia in the 50s.  He is now on amiodarone at just 200 mg.  Blood pressure today is stable on his current regimen.  Reviewed his most recent CPAP download.  Compliance is excellent.  At his pressure setting of 11 to 16 cm of water, AHI is 2.8 with his 95th percentile pressure at 14.7 with maximum average pressure at 15.3.  I have recommended slight adjustment in his pressures and will change his CPAP to a range of 12 to 18 cm of water.  He has been using some effects bandage over his mouth but his mouth still gets open in the latter part of the night.  I have suggested he had a chinstrap to take with his fullface mask.  Will monitor his heart rate and blood pressure.  He continues to be on atorvastatin for hyperlipidemia.  He will follow-up with his primary physician.  I will see him in 6 months for reevaluation or sooner as  needed.   Lennette Bihari, MD, [facility], ABSM Diplomate, American Board of Sleep Medicine   01/22/2023 1:10 PM

## 2023-01-22 ENCOUNTER — Encounter: Payer: Self-pay | Admitting: Cardiovascular Disease

## 2023-01-22 ENCOUNTER — Ambulatory Visit: Payer: Medicare Other | Attending: Cardiovascular Disease | Admitting: Cardiovascular Disease

## 2023-01-22 DIAGNOSIS — I2510 Atherosclerotic heart disease of native coronary artery without angina pectoris: Secondary | ICD-10-CM

## 2023-01-22 DIAGNOSIS — I4892 Unspecified atrial flutter: Secondary | ICD-10-CM | POA: Diagnosis not present

## 2023-01-22 DIAGNOSIS — Z951 Presence of aortocoronary bypass graft: Secondary | ICD-10-CM | POA: Diagnosis not present

## 2023-01-22 DIAGNOSIS — I10 Essential (primary) hypertension: Secondary | ICD-10-CM

## 2023-01-22 DIAGNOSIS — G4733 Obstructive sleep apnea (adult) (pediatric): Secondary | ICD-10-CM | POA: Diagnosis not present

## 2023-01-22 DIAGNOSIS — E782 Mixed hyperlipidemia: Secondary | ICD-10-CM

## 2023-01-22 NOTE — Patient Instructions (Signed)
Medication Instructions:  No medication changes *If you need a refill on your cardiac medications before your next appointment, please call your pharmacy*   Lab Work: No labs ordered If you have labs (blood work) drawn today and your tests are completely normal, you will receive your results only by: MyChart Message (if you have MyChart) OR A paper copy in the mail If you have any lab test that is abnormal or we need to change your treatment, we will call you to review the results.   Testing/Procedures: None   Follow-Up: At [facility], you and your health needs are our priority.  As part of our continuing mission to provide you with exceptional heart care, we have created designated Provider Care Teams.  These Care Teams include your primary Cardiologist (physician) and Advanced Practice Providers (APPs -  Physician Assistants and Nurse Practitioners) who all work together to provide you with the care you need, when you need it.  We recommend signing up for the patient portal called "MyChart".  Sign up information is provided on this After Visit Summary.  MyChart is used to connect with patients for Virtual Visits (Telemedicine).  Patients are able to view lab/test results, encounter notes, upcoming appointments, etc.  Non-urgent messages can be sent to your provider as well.   To learn more about what you can do with MyChart, go to [email or web address].    Your next appointment:   6 month(s)  Provider:   Nicki Guadalajara, MD

## 2023-01-24 ENCOUNTER — Telehealth: Payer: Self-pay | Admitting: Cardiovascular Disease

## 2023-01-24 NOTE — Telephone Encounter (Signed)
Caryn Bee from [facility] is requesting to have prescription faxed over and chart notes. Please advise

## 2023-01-30 ENCOUNTER — Encounter: Payer: Self-pay | Admitting: Nurse Practitioner

## 2023-02-01 ENCOUNTER — Other Ambulatory Visit: Payer: Self-pay | Admitting: Cardiovascular Disease

## 2023-02-01 DIAGNOSIS — G4733 Obstructive sleep apnea (adult) (pediatric): Secondary | ICD-10-CM

## 2023-02-01 NOTE — Progress Notes (Signed)
CPAP Supply order sent to [facility] 02/01/23

## 2023-04-06 ENCOUNTER — Other Ambulatory Visit: Payer: Self-pay | Admitting: Cardiovascular Disease

## 2023-04-06 DIAGNOSIS — I10 Essential (primary) hypertension: Secondary | ICD-10-CM

## 2023-04-08 ENCOUNTER — Other Ambulatory Visit: Payer: Self-pay | Admitting: Cardiovascular Disease

## 2023-04-08 DIAGNOSIS — I480 Paroxysmal atrial fibrillation: Secondary | ICD-10-CM

## 2023-04-09 NOTE — Telephone Encounter (Signed)
 Prescription refill request for Eliquis received. Indication:aflutter Last office visit:10/24 Scr:1.65  9/24 Age: 83 Weight:84.7  kg  Prescription refilled

## 2023-04-16 ENCOUNTER — Other Ambulatory Visit: Payer: Self-pay | Admitting: Cardiovascular Disease

## 2023-04-22 ENCOUNTER — Other Ambulatory Visit: Payer: Self-pay | Admitting: Cardiovascular Disease

## 2023-04-30 ENCOUNTER — Other Ambulatory Visit: Payer: Self-pay | Admitting: Cardiovascular Disease

## 2023-06-27 ENCOUNTER — Other Ambulatory Visit: Payer: Self-pay | Admitting: Cardiovascular Disease

## 2023-07-18 NOTE — Progress Notes (Signed)
 [place], CMA,acting as a Neurosurgeon for Mario Epley, FNP.,have documented all relevant documentation on the behalf of Mario Epley, FNP,as directed by  Mario Epley, FNP while in the presence of Mario Epley, FNP.  Subjective:  Patient ID: Mario Palmer , male    DOB: 09-09-1940 , 83 y.o.   MRN: [id number]  Chief Complaint  Patient presents with   Hypertension    HPI  Patient presents today for a bp and chol follow up, Patient reports compliance with medication. Patient denies any chest pain, SOB, or headaches. Patient has no concerns today. Continues to be followed by Dr. Loetta Palmer who is retiring soon. He will see Dr. Manuel [facility] in June.     Past Medical History:  Diagnosis Date   Arthritis    Cancer (HCC)    skin cancer   Dysrhythmia    afib   GERD (gastroesophageal reflux disease)    High cholesterol    History of kidney stones    Hx of echocardiogram 12/2010   EF 55%, There is Stage 1 diastolic dyfunction. Normal LV filling pressure. Aortic sclerosis with mild AI, mild TR   Hypertension    Ileus (HCC) 12/05/2018   Ileus, postoperative (HCC) 12/03/2018   Palpitation    with ecotopy   Sleep apnea    Traumatic closed fracture of C2 vertebra with minimal displacement (HCC) 06/03/2011     History reviewed. No pertinent family history.   Current Outpatient Medications:    amiodarone  (PACERONE ) 200 MG tablet, Take 1 tablet (200 mg total) by mouth daily., Disp: 90 tablet, Rfl: 2   atorvastatin  (LIPITOR) 20 MG tablet, TAKE 1 TABLET BY MOUTH ONCE DAILY, Disp: 90 tablet, Rfl: 2   Cholecalciferol (VITAMIN D ) 2000 UNITS tablet, Take 2,000 Units by mouth daily., Disp: , Rfl:    diltiazem  (CARDIZEM  CD) 120 MG 24 hr capsule, TAKE 1 CAPSULE BY MOUTH AT BEDTIME, Disp: 90 capsule, Rfl: 3   ELIQUIS  2.5 MG TABS tablet, TAKE 1 TABLET TWICE A DAY, Disp: 60 tablet, Rfl: 4   lisinopril  (ZESTRIL ) 40 MG tablet, TAKE 1 TABLET ONCE DAILY, Disp: 90 tablet, Rfl: 1   Multiple  Vitamins-Minerals (MULTIVITAMIN ADULT PO), Take 1 tablet by mouth daily., Disp: , Rfl:    nebivolol  (BYSTOLIC ) 10 MG tablet, TAKE 1/2 TABLET BY MOUTH ONCE DAILY, Disp: 45 tablet, Rfl: 2   NON FORMULARY, CPAP therapy, Disp: , Rfl:    Saccharomyces boulardii (PROBIOTIC) 250 MG CAPS, Take 250 mg by mouth daily., Disp: , Rfl:    spironolactone  (ALDACTONE ) 25 MG tablet, TAKE 1 TABLET ONCE DAILY, Disp: 30 tablet, Rfl: 9   tamsulosin (FLOMAX) 0.4 MG CAPS capsule, Take 0.4 mg by mouth daily., Disp: , Rfl:    No Known Allergies   Review of Systems  Constitutional: Negative.   HENT: Negative.    Eyes: Negative.   Respiratory: Negative.    Cardiovascular: Negative.   Gastrointestinal: Negative.   Neurological: Negative.   Psychiatric/Behavioral: Negative.       Today's Vitals   07/19/23 1123  BP: 100/70  Pulse: (!) 59  Temp: 98.2 F (36.8 C)  TempSrc: Oral  Weight: 191 lb (86.6 kg)  Height: 5\' 10"  (1.778 m)  PainSc: 0-No pain   Body mass index is 27.41 kg/m.  Wt Readings from Last 3 Encounters:  07/19/23 191 lb (86.6 kg)  01/22/23 186 lb 12.8 oz (84.7 kg)  01/18/23 185 lb (83.9 kg)   Objective:  Physical Exam Vitals and nursing note  reviewed.  Constitutional:      General: He is not in acute distress.    Appearance: Normal appearance.  HENT:     Ears:     Comments: Wearing bilateral hearing aids.  Cardiovascular:     Rate and Rhythm: Normal rate and regular rhythm.     Pulses: Normal pulses.     Heart sounds: Normal heart sounds. No murmur heard. Pulmonary:     Effort: Pulmonary effort is normal. No respiratory distress.     Breath sounds: Normal breath sounds. No wheezing.  Skin:    General: Skin is warm and dry.     Capillary Refill: Capillary refill takes less than 2 seconds.     Comments: Skin has multiple areas to hands of superficial bruising (elderly)  Neurological:     General: No focal deficit present.     Mental Status: He is alert and oriented to person,  place, and time.  Psychiatric:        Mood and Affect: Mood normal.        Behavior: Behavior normal.        Thought Content: Thought content normal.        Judgment: Judgment normal.         Assessment And Plan:  Essential hypertension -     BMP8+eGFR  Stage 3a chronic kidney disease (HCC) Assessment & Plan: Has been stable, encouraged to stay well hydrated with water.    Mixed hyperlipidemia Assessment & Plan: Cholesterol levels are normal.  Continue statin, tolerating well.  Orders: -     Lipid panel  Atrial flutter, unspecified type [facility]) Assessment & Plan: Continue current medications.    COVID-19 vaccination declined Assessment & Plan: Declines covid 19 vaccine. Discussed risk of covid 81 and if he changes her mind about the vaccine to call the office. Education has been provided regarding the importance of this vaccine but patient still declined. Advised may receive this vaccine at local pharmacy or Health Dept.or vaccine clinic. Aware to provide a copy of the vaccination record if obtained from local pharmacy or Health Dept.  Encouraged to take multivitamin, vitamin d , vitamin c and zinc  to increase immune system. Aware can call office if would like to have vaccine here at office. Verbalized acceptance and understanding.    Herpes zoster vaccination declined Assessment & Plan: Declines shingrix, educated on disease process and is aware if he changes his mind to notify office    Overweight with body mass index (BMI) of 27 to 27.9 in adult  Parenchymal renal hypertension, stage 1 through stage 4 or unspecified chronic kidney disease Assessment & Plan: Blood pressure is well controlled, continue current medications,      Return for 6 month bp check.  Patient was given opportunity to ask questions. Patient verbalized understanding of the plan and was able to repeat key elements of the plan. All questions were answered to their satisfaction.    Inge Mangle, FNP, have reviewed all documentation for this visit. The documentation on 07/19/23 for the exam, diagnosis, procedures, and orders are all accurate and complete.   IF YOU HAVE BEEN REFERRED TO A SPECIALIST, IT MAY TAKE 1-2 WEEKS TO SCHEDULE/PROCESS THE REFERRAL. IF YOU HAVE NOT HEARD FROM US /SPECIALIST IN TWO WEEKS, PLEASE GIVE US  A CALL AT [phone].

## 2023-07-19 ENCOUNTER — Encounter: Payer: Self-pay | Admitting: Nurse Practitioner

## 2023-07-19 ENCOUNTER — Ambulatory Visit: Payer: Medicare Other | Admitting: Nurse Practitioner

## 2023-07-19 VITALS — BP 100/70 | HR 59 | Temp 98.2°F | Ht 70.0 in | Wt 191.0 lb

## 2023-07-19 DIAGNOSIS — N1831 Chronic kidney disease, stage 3a: Secondary | ICD-10-CM

## 2023-07-19 DIAGNOSIS — I4892 Unspecified atrial flutter: Secondary | ICD-10-CM

## 2023-07-19 DIAGNOSIS — E782 Mixed hyperlipidemia: Secondary | ICD-10-CM

## 2023-07-19 DIAGNOSIS — Z6827 Body mass index (BMI) 27.0-27.9, adult: Secondary | ICD-10-CM

## 2023-07-19 DIAGNOSIS — Z2821 Immunization not carried out because of patient refusal: Secondary | ICD-10-CM

## 2023-07-19 DIAGNOSIS — I129 Hypertensive chronic kidney disease with stage 1 through stage 4 chronic kidney disease, or unspecified chronic kidney disease: Secondary | ICD-10-CM | POA: Diagnosis not present

## 2023-07-19 DIAGNOSIS — E663 Overweight: Secondary | ICD-10-CM

## 2023-07-19 DIAGNOSIS — I119 Hypertensive heart disease without heart failure: Secondary | ICD-10-CM

## 2023-07-19 DIAGNOSIS — I10 Essential (primary) hypertension: Secondary | ICD-10-CM | POA: Diagnosis not present

## 2023-07-19 NOTE — Assessment & Plan Note (Signed)
 Continue current medications.

## 2023-07-19 NOTE — Assessment & Plan Note (Signed)
Cholesterol levels are normal.  Continue statin, tolerating well.

## 2023-07-19 NOTE — Assessment & Plan Note (Signed)
 Has been stable, encouraged to stay well hydrated with water.

## 2023-07-19 NOTE — Assessment & Plan Note (Signed)
 Declines shingrix, educated on disease process and is aware if he changes his mind to notify office

## 2023-07-19 NOTE — Patient Instructions (Signed)
 Check your blood pressure daily if consistently low with dizziness may need to cut back on a medication

## 2023-07-20 LAB — BMP8+EGFR
BUN/Creatinine Ratio: 18 (ref 10–24)
BUN: 30 mg/dL — ABNORMAL HIGH (ref 8–27)
CO2: 24 mmol/L (ref 20–29)
Calcium: 9.7 mg/dL (ref 8.6–10.2)
Chloride: 104 mmol/L (ref 96–106)
Creatinine, Ser: 1.64 mg/dL — ABNORMAL HIGH (ref 0.76–1.27)
Glucose: 75 mg/dL (ref 70–99)
Potassium: 5.6 mmol/L — ABNORMAL HIGH (ref 3.5–5.2)
Sodium: 140 mmol/L (ref 134–144)
eGFR: 41 mL/min/{1.73_m2} — ABNORMAL LOW (ref >59)

## 2023-07-20 LAB — LIPID PANEL
Chol/HDL Ratio: 2.7 ratio (ref 0.0–5.0)
Cholesterol, Total: 152 mg/dL (ref 100–199)
HDL: 57 mg/dL (ref >39)
LDL Chol Calc (NIH): 73 mg/dL (ref 0–99)
Triglycerides: 124 mg/dL (ref 0–149)
VLDL Cholesterol Cal: 22 mg/dL (ref 5–40)

## 2023-07-29 ENCOUNTER — Encounter: Payer: Self-pay | Admitting: Nurse Practitioner

## 2023-07-29 DIAGNOSIS — Z2821 Immunization not carried out because of patient refusal: Secondary | ICD-10-CM | POA: Insufficient documentation

## 2023-07-29 NOTE — Assessment & Plan Note (Deleted)
 Blood pressure is well controlled, continue current medications,

## 2023-07-29 NOTE — Assessment & Plan Note (Signed)
 Blood pressure is well controlled, continue current medications,

## 2023-07-29 NOTE — Assessment & Plan Note (Signed)

## 2023-07-31 ENCOUNTER — Ambulatory Visit: Payer: Self-pay

## 2023-07-31 NOTE — Telephone Encounter (Signed)
 Returning missed call from CMA in office. Called during their lunch hour. Please reach back out to [place].    Copied from CRM [id number]. Topic: General - Call Back - No Documentation >> Jul 31, 2023  1:23 PM Shereese L wrote: Reason for CRM: patient was calling to return office call and Is requesting for the nurse or doctor to give him a call back Reason for Disposition  NON-URGENT call redirected to PCP's office because it is open    CRM sent to clinical pool, patient returned CMA's call during the practice lunch hour.  Protocols used: No Contact or Duplicate Contact Call-A-AH

## 2023-08-01 NOTE — Progress Notes (Signed)
 Done

## 2023-08-13 ENCOUNTER — Other Ambulatory Visit: Payer: Self-pay | Admitting: Cardiovascular Disease

## 2023-09-12 ENCOUNTER — Other Ambulatory Visit: Payer: Self-pay | Admitting: Cardiovascular Disease

## 2023-09-12 DIAGNOSIS — I480 Paroxysmal atrial fibrillation: Secondary | ICD-10-CM

## 2023-09-12 NOTE — Telephone Encounter (Signed)
 Eliquis  2.5mg  refill request received. Patient is 83 years old, weight-86.6kg, Crea-1.64 on 07/19/23, Diagnosis-Aflutter, and last seen by Dr. Loetta Ringer on 01/22/23. Dose is appropriate based on dosing criteria. Will send in refill to requested pharmacy.

## 2023-09-18 DIAGNOSIS — H35371 Puckering of macula, right eye: Secondary | ICD-10-CM | POA: Diagnosis not present

## 2023-09-18 DIAGNOSIS — H353121 Nonexudative age-related macular degeneration, left eye, early dry stage: Secondary | ICD-10-CM | POA: Diagnosis not present

## 2023-09-18 DIAGNOSIS — H353112 Nonexudative age-related macular degeneration, right eye, intermediate dry stage: Secondary | ICD-10-CM | POA: Diagnosis not present

## 2023-09-18 DIAGNOSIS — H35372 Puckering of macula, left eye: Secondary | ICD-10-CM | POA: Diagnosis not present

## 2023-10-08 ENCOUNTER — Telehealth: Payer: Self-pay

## 2023-10-08 ENCOUNTER — Ambulatory Visit: Attending: Cardiovascular Disease | Admitting: Cardiovascular Disease

## 2023-10-08 ENCOUNTER — Encounter: Payer: Self-pay | Admitting: Cardiovascular Disease

## 2023-10-08 VITALS — BP 92/58 | HR 63 | Ht 70.0 in | Wt 185.8 lb

## 2023-10-08 DIAGNOSIS — G4733 Obstructive sleep apnea (adult) (pediatric): Secondary | ICD-10-CM

## 2023-10-08 DIAGNOSIS — I4892 Unspecified atrial flutter: Secondary | ICD-10-CM

## 2023-10-08 DIAGNOSIS — I10 Essential (primary) hypertension: Secondary | ICD-10-CM

## 2023-10-08 DIAGNOSIS — I480 Paroxysmal atrial fibrillation: Secondary | ICD-10-CM

## 2023-10-08 DIAGNOSIS — Z951 Presence of aortocoronary bypass graft: Secondary | ICD-10-CM

## 2023-10-08 DIAGNOSIS — I2510 Atherosclerotic heart disease of native coronary artery without angina pectoris: Secondary | ICD-10-CM

## 2023-10-08 DIAGNOSIS — E782 Mixed hyperlipidemia: Secondary | ICD-10-CM

## 2023-10-08 DIAGNOSIS — I4819 Other persistent atrial fibrillation: Secondary | ICD-10-CM

## 2023-10-08 MED ORDER — LISINOPRIL 20 MG PO TABS: 20.0000 mg | ORAL_TABLET | Freq: Every day | ORAL | 3 refills | Status: DC

## 2023-10-08 NOTE — Assessment & Plan Note (Signed)
 History of hyperlipidemia on statin therapy with lipid profile performed 07/19/2023 revealing total cholesterol 152, LDL 73 and HDL 57.

## 2023-10-08 NOTE — Patient Instructions (Signed)
 Medication Instructions:  Your physician has recommended you make the following change in your medication:   -Decrease lisinopril  (zesteril) 20mg  once daily.   *If you need a refill on your cardiac medications before your next appointment, please call your pharmacy*   Follow-Up: At [facility], you and your health needs are our priority.  As part of our continuing mission to provide you with exceptional heart care, our providers are all part of one team.  This team includes your primary Cardiologist (physician) and Advanced Practice Providers or APPs (Physician Assistants and Nurse Practitioners) who all work together to provide you with the care you need, when you need it.  Your next appointment:   12 month(s)  Provider:   Dorn Lesches, MD    We recommend signing up for the patient portal called MyChart.  Sign up information is provided on this After Visit Summary.  MyChart is used to connect with patients for Virtual Visits (Telemedicine).  Patients are able to view lab/test results, encounter notes, upcoming appointments, etc.  Non-urgent messages can be sent to your provider as well.   To learn more about what you can do with MyChart, go to [email or web address].   Other Instructions Dr. Lesches has requested that you schedule an appointment with one of our clinical pharmacists for a blood pressure check appointment within the next 4 weeks.  If you monitor your blood pressure (BP) at home, please bring your BP cuff and your BP readings with you to this appointment  HOW TO TAKE YOUR BLOOD PRESSURE: Rest 5 minutes before taking your blood pressure. Don't smoke or drink caffeinated beverages for at least 30 minutes before. Take your blood pressure before (not after) you eat. Sit comfortably with your back supported and both feet on the floor (don't cross your legs). Elevate your arm to heart level on a table or a desk. Use the proper sized cuff. It should fit smoothly  and snugly around your bare upper arm. There should be enough room to slip a fingertip under the cuff. The bottom edge of the cuff should be 1 inch above the crease of the elbow. Ideally, take 3 measurements at one sitting and record the average.

## 2023-10-08 NOTE — Progress Notes (Signed)
 10/08/2023 Colon Rueth Goto   08-Sep-1940  [id number]  Primary Physician Georgina Speaks, FNP Primary Cardiologist: Dorn JINNY Lesches MD [facility] [place], [place]  HPI:  Mario Palmer is a 83 y.o. married Caucasian male father of 2 children, grandfather of 2 grandchildren formally a patient of Dr. Joesphine.  I am assuming his care in Dr. Joesphine absence.  He is retired from being in Designer, fashion/clothing.  He quit smoking 40 years ago.  Other problems include treated hypertension hyperlipidemia.  Never had a heart attack or stroke.  He does have obstructive sleep apnea on CPAP.  He has had PAF status post cardioversion x 2 the last 1 of which was performed by Dr. Ronal Ross 01/02/2023.  He is maintained sinus rhythm on amiodarone  and Eliquis .   Current Meds  Medication Sig   amiodarone  (PACERONE ) 200 MG tablet Take 1 tablet (200 mg total) by mouth daily.   apixaban  (ELIQUIS ) 2.5 MG TABS tablet TAKE ONE TABLET BY MOUTH TWICE DAILY   atorvastatin  (LIPITOR) 20 MG tablet TAKE 1 TABLET BY MOUTH ONCE DAILY   Cholecalciferol (VITAMIN D ) 2000 UNITS tablet Take 2,000 Units by mouth daily.   diltiazem  (CARDIZEM  CD) 120 MG 24 hr capsule TAKE 1 CAPSULE BY MOUTH AT BEDTIME   lisinopril  (ZESTRIL ) 40 MG tablet TAKE 1 TABLET ONCE DAILY   Multiple Vitamins-Minerals (MULTIVITAMIN ADULT PO) Take 1 tablet by mouth daily.   nebivolol  (BYSTOLIC ) 10 MG tablet TAKE 1/2 TABLET BY MOUTH ONCE DAILY   NON FORMULARY CPAP therapy   Saccharomyces boulardii (PROBIOTIC) 250 MG CAPS Take 250 mg by mouth daily.   spironolactone  (ALDACTONE ) 25 MG tablet TAKE 1 TABLET ONCE DAILY   tamsulosin (FLOMAX) 0.4 MG CAPS capsule Take 0.4 mg by mouth daily.     No Known Allergies  Social History   Socioeconomic History   Marital status: Married    Spouse name: Not on file   Number of children: Not on file   Years of education: Not on file   Highest education level: Not on file  Occupational History   Occupation: retired  Tobacco  Use   Smoking status: Former    Types: Cigarettes   Smokeless tobacco: Never   Tobacco comments:    quit smoking in 1982  Vaping Use   Vaping status: Never Used  Substance and Sexual Activity   Alcohol use: No   Drug use: No   Sexual activity: Yes  Other Topics Concern   Not on file  Social History Narrative   Not on file   Social Drivers of Health   Financial Resource Strain: Low Risk  (01/18/2023)   Overall Financial Resource Strain (CARDIA)    Difficulty of Paying Living Expenses: Not hard at all  Food Insecurity: No Food Insecurity (01/18/2023)   Hunger Vital Sign    Worried About Running Out of Food in the Last Year: Never true    Ran Out of Food in the Last Year: Never true  Transportation Needs: No Transportation Needs (01/18/2023)   PRAPARE - Administrator, Civil Service (Medical): No    Lack of Transportation (Non-Medical): No  Physical Activity: Sufficiently Active (01/18/2023)   Exercise Vital Sign    Days of Exercise per Week: 7 days    Minutes of Exercise per Session: 60 min  Stress: No Stress Concern Present (01/18/2023)   [employer] of Occupational Health - Occupational Stress Questionnaire    Feeling of Stress : Not at all  Social Connections: Moderately Integrated (01/18/2023)   Social Connection and Isolation Panel    Frequency of Communication with Friends and Family: Three times a week    Frequency of Social Gatherings with Friends and Family: Three times a week    Attends Religious Services: More than 4 times per year    Active Member of Clubs or Organizations: No    Attends Banker Meetings: Never    Marital Status: Married  Catering manager Violence: Not At Risk (01/18/2023)   Humiliation, Afraid, Rape, and Kick questionnaire    Fear of Current or Ex-Partner: No    Emotionally Abused: No    Physically Abused: No    Sexually Abused: No     Review of Systems: General: negative for chills, fever, night sweats or  weight changes.  Cardiovascular: negative for chest pain, dyspnea on exertion, edema, orthopnea, palpitations, paroxysmal nocturnal dyspnea or shortness of breath Dermatological: negative for rash Respiratory: negative for cough or wheezing Urologic: negative for hematuria Abdominal: negative for nausea, vomiting, diarrhea, bright red blood per rectum, melena, or hematemesis Neurologic: negative for visual changes, syncope, or dizziness All other systems reviewed and are otherwise negative except as noted above.    Blood pressure (!) 92/58, pulse 63, height 5' 10 (1.778 m), weight 185 lb 12.8 oz (84.3 kg).  General appearance: alert and no distress Neck: no adenopathy, no carotid bruit, no JVD, supple, symmetrical, trachea midline, and thyroid  not enlarged, symmetric, no tenderness/mass/nodules Lungs: clear to auscultation bilaterally Heart: regular rate and rhythm, S1, S2 normal, no murmur, click, rub or gallop Extremities: extremities normal, atraumatic, no cyanosis or edema Pulses: 2+ and symmetric Skin: Skin color, texture, turgor normal. No rashes or lesions Neurologic: Grossly normal  EKG EKG Interpretation Date/Time:  Monday October 08 2023 15:20:17 EDT Ventricular Rate:  63 PR Interval:  214 QRS Duration:  98 QT Interval:  424 QTC Calculation: 433 R Axis:   -27  Text Interpretation: Sinus rhythm with 1st degree A-V block When compared with ECG of 22-Jan-2023 12:49, No significant change was found Confirmed by Court Carrier [id number]) on 10/08/2023 3:46:42 PM    ASSESSMENT AND PLAN:   OSA on CPAP History of obstructive sleep apnea on CPAP.  HTN (hypertension) History of essential hypertension with blood pressure measured today at 92/58.  He is on diltiazem , lisinopril  and Bystolic .  I am going to cut his lisinopril  in half from 40 mg to 20 mg and have him keep a 3-day blood pressure log after which we will see a Pharm.D. in follow-up.  He does say that he feels  lethargic.  Mixed hyperlipidemia History of hyperlipidemia on statin therapy with lipid profile performed 07/19/2023 revealing total cholesterol 152, LDL 73 and HDL 57.  Paroxysmal atrial fibrillation (HCC) History of paroxysmal A-fib maintaining sinus rhythm on Eliquis  oral anticoagulation.  His last cardioversion performed Dr. Ronal Ross was 01/02/2023.     Carrier DOROTHA Court MD FACP,FACC,FAHA, FSCAI 10/08/2023 4:00 PM

## 2023-10-08 NOTE — Addendum Note (Signed)
 Addended by: LORRENE FEDERICO CROME on: 10/08/2023 04:45 PM   Modules accepted: Orders

## 2023-10-08 NOTE — Assessment & Plan Note (Signed)
 History of obstructive sleep apnea on CPAP.

## 2023-10-08 NOTE — Addendum Note (Signed)
 Addended by: LORRENE FEDERICO CROME on: 10/08/2023 04:09 PM   Modules accepted: Orders

## 2023-10-08 NOTE — Assessment & Plan Note (Signed)
 History of essential hypertension with blood pressure measured today at 92/58.  He is on diltiazem , lisinopril  and Bystolic .  I am going to cut his lisinopril  in half from 40 mg to 20 mg and have him keep a 3-day blood pressure log after which we will see a Pharm.D. in follow-up.  He does say that he feels lethargic.

## 2023-10-08 NOTE — Assessment & Plan Note (Signed)
 History of paroxysmal A-fib maintaining sinus rhythm on Eliquis  oral anticoagulation.  His last cardioversion performed Dr. Ronal Ross was 01/02/2023.

## 2023-10-08 NOTE — Telephone Encounter (Signed)
 Pt seen in clinic today with Dr. Court, requesting CPAP supplies. Will forward to sleep coordinator.

## 2023-10-11 ENCOUNTER — Other Ambulatory Visit: Payer: Self-pay | Admitting: Cardiovascular Disease

## 2023-10-11 DIAGNOSIS — I10 Essential (primary) hypertension: Secondary | ICD-10-CM

## 2023-10-11 NOTE — Addendum Note (Signed)
 Addended by: BEVELY CONNELL BROCKS on: 10/11/2023 03:09 PM   Modules accepted: Orders

## 2023-10-11 NOTE — Telephone Encounter (Signed)
 Order for PAP supplies sent to [facility] today

## 2023-11-07 DIAGNOSIS — R350 Frequency of micturition: Secondary | ICD-10-CM | POA: Diagnosis not present

## 2023-11-20 ENCOUNTER — Encounter: Payer: Self-pay | Admitting: Pharmacist

## 2023-11-20 ENCOUNTER — Ambulatory Visit: Attending: Cardiology | Admitting: Pharmacist

## 2023-11-20 VITALS — BP 115/58 | HR 58

## 2023-11-20 DIAGNOSIS — I10 Essential (primary) hypertension: Secondary | ICD-10-CM | POA: Diagnosis not present

## 2023-11-20 MED ORDER — SPIRONOLACTONE 25 MG PO TABS: 12.5000 mg | ORAL_TABLET | Freq: Every day | ORAL | Status: DC

## 2023-11-20 NOTE — Progress Notes (Signed)
 Patient ID: Mario Palmer                 DOB: 1941/03/15                      MRN: [id number]     HPI: Mario Palmer is a 83 y.o. male referred by Dr. Court to HTN clinic. Former patient of Dr Burnard. PMH is significant for A fib, HTN, CKD, and OSA.  Patient seen by Dr Court on 10/08/23 and blood pressure was 92/58. Lisinopril  was reduced from 40mg  to 20mg .  Patient presents today to discuss hypotension. Reports he feels less tired and lethargic since dose decrease.  Uses home Omron cuff. Had patient demonstrate in room. Home cuff reading: 122/56.  Recent home readings: 8/14: 123/64 8/15: 121/64 8/16: 121/73 8/17: 114/76 8/18: 124/72 8/19: 115/71  Current HTN meds:  Diltiazem  120mg  daily Nebivolol  5mg  daily Lisinopril  20mg  daily Spironolactone  25mg  daily  BP goal: <130/80   Wt Readings from Last 3 Encounters:  10/08/23 185 lb 12.8 oz (84.3 kg)  07/19/23 191 lb (86.6 kg)  01/22/23 186 lb 12.8 oz (84.7 kg)   BP Readings from Last 3 Encounters:  10/08/23 (!) 92/58  07/19/23 100/70  01/22/23 112/70   Pulse Readings from Last 3 Encounters:  10/08/23 63  07/19/23 (!) 59  01/22/23 (!) 58    Renal function: CrCl cannot be calculated (Patient's most recent lab result is older than the maximum 21 days allowed.).  Past Medical History:  Diagnosis Date   Arthritis    Cancer (HCC)    skin cancer   Dysrhythmia    afib   GERD (gastroesophageal reflux disease)    High cholesterol    History of kidney stones    Hx of echocardiogram 12/2010   EF 55%, There is Stage 1 diastolic dyfunction. Normal LV filling pressure. Aortic sclerosis with mild AI, mild TR   Hypertension    Ileus (HCC) 12/05/2018   Ileus, postoperative (HCC) 12/03/2018   Palpitation    with ecotopy   Sleep apnea    Traumatic closed fracture of C2 vertebra with minimal displacement (HCC) 06/03/2011    Current Outpatient Medications on File Prior to Visit  Medication Sig Dispense Refill    amiodarone  (PACERONE ) 200 MG tablet Take 1 tablet (200 mg total) by mouth daily. 90 tablet 2   apixaban  (ELIQUIS ) 2.5 MG TABS tablet TAKE ONE TABLET BY MOUTH TWICE DAILY 60 tablet 5   atorvastatin  (LIPITOR) 20 MG tablet TAKE 1 TABLET BY MOUTH ONCE DAILY 90 tablet 2   Cholecalciferol (VITAMIN D ) 2000 UNITS tablet Take 2,000 Units by mouth daily.     diltiazem  (CARDIZEM  CD) 120 MG 24 hr capsule TAKE 1 CAPSULE BY MOUTH AT BEDTIME 90 capsule 3   lisinopril  (ZESTRIL ) 20 MG tablet Take 1 tablet (20 mg total) by mouth daily. 30 tablet 3   Multiple Vitamins-Minerals (MULTIVITAMIN ADULT PO) Take 1 tablet by mouth daily.     nebivolol  (BYSTOLIC ) 10 MG tablet TAKE 1/2 TABLET BY MOUTH ONCE DAILY 45 tablet 2   NON FORMULARY CPAP therapy     Saccharomyces boulardii (PROBIOTIC) 250 MG CAPS Take 250 mg by mouth daily.     spironolactone  (ALDACTONE ) 25 MG tablet TAKE 1 TABLET ONCE DAILY 30 tablet 9   tamsulosin (FLOMAX) 0.4 MG CAPS capsule Take 0.4 mg by mouth daily.     No current facility-administered medications on file prior to visit.    No  Known Allergies   Assessment/Plan:  1. Hypertension -  Patient BP in room today 115/78 which is at goal of <130/80. Feels better since decrease of lisinopril . Will further taper spironolactone  to 12.5mg  which may help CKD and past hyperkalemia. Educated patient on proper technique for taking home BP readings. Patient will continue monitoring at home and let clinic know if BP increases.  Continue: Lisinopril  20mg  daily Nebivolol  5mg  daily Diltiazem  120mg  daily Reduce spironolactone  to 12.5mg  daily  Medford Bolk, PharmD, [place], CDCES, CPP [facility] [address] [place], [place] [address] Phone: [phone]; Fax: [phone] 11/20/2023 1:10 PM

## 2023-11-20 NOTE — Patient Instructions (Addendum)
 Nice meeting you today  Your blood pressure looks much better. We would like it to stay less than 130/80  Please reduce your spironolactone  to 1/2 tablet a day (12.5mg )  Continue your: Lisinopril  20mg  once a day Bystolic  5mg  once a day Diltiazem  120mg  once a day  Continue checking your blood pressure at home. Let us  know if you see readings increasing past 1309/80/.  If you readings remain low, the next step is to reduce your Bystolic  (nebivolol )  Medford Bolk, PharmD, BCACP, CDCES, CPP [facility] [address], [place], [place] [address] Phone: [phone]; Fax: [phone] 11/20/2023 9:27 AM

## 2023-12-20 DIAGNOSIS — H6122 Impacted cerumen, left ear: Secondary | ICD-10-CM | POA: Diagnosis not present

## 2023-12-20 DIAGNOSIS — H6121 Impacted cerumen, right ear: Secondary | ICD-10-CM | POA: Diagnosis not present

## 2023-12-21 DIAGNOSIS — R350 Frequency of micturition: Secondary | ICD-10-CM | POA: Diagnosis not present

## 2023-12-31 ENCOUNTER — Other Ambulatory Visit: Payer: Self-pay

## 2023-12-31 MED ORDER — AMIODARONE HCL 200 MG PO TABS: 200.0000 mg | ORAL_TABLET | Freq: Every day | ORAL | 3 refills | Status: AC

## 2024-01-15 ENCOUNTER — Ambulatory Visit: Attending: Cardiology | Admitting: Pharmacist

## 2024-01-15 VITALS — BP 109/69 | HR 58

## 2024-01-15 DIAGNOSIS — I10 Essential (primary) hypertension: Secondary | ICD-10-CM

## 2024-01-15 DIAGNOSIS — I4892 Unspecified atrial flutter: Secondary | ICD-10-CM | POA: Diagnosis not present

## 2024-01-15 MED ORDER — NEBIVOLOL HCL 2.5 MG PO TABS: 2.5000 mg | ORAL_TABLET | Freq: Every day | ORAL | 1 refills | Status: DC

## 2024-01-15 NOTE — Patient Instructions (Addendum)
 It was good seeing you again  Your blood pressure is controlled but your pulse rate is trending low  Reduce your nebivolol  (Bystolic ) to 2.5mg  once a day  Continue your: Lisinopril  20mg  once a day Spironolactone  12.5mg  daily Diltiazem  120mg  daily  Continue to watch your blood pressure and pulse rate at home  I will get your scheduled with our a fib department   Medford Bolk, PharmD, BCACP, CDCES, CPP [facility] [address], [place], [place] [address] Phone: [phone]; Fax: [phone] 01/15/2024 9:20 AM

## 2024-01-15 NOTE — Progress Notes (Signed)
 Patient ID: Mario Palmer                 DOB: [birth year]                      MRN: [id number]     HPI: Mario Palmer is a 83 y.o. male referred by Dr. Court to HTN clinic. Former patient of Dr Burnard. PMH is significant for A fib, HTN, CKD, and OSA.  Patient seen by Dr Court on 10/08/23 and blood pressure was 92/58. Lisinopril  was reduced from 40mg  to 20mg .  Reported he felt bettwe at last pharmD visit due to lisinopril  decrease. However BP was still low so spironolactone  was decreased to 12.5mg  daily.At last pharmD visit Patient presents today to discuss hypotension. Reports he feels less tired and lethargic since dose decrease.  Recent home readings:  10/5: 127/71 10/1: 126/62 9/18: 123/73 9/13: 116/68 9/12: 125/68 9/9: 122/61  Patient has noticed his pulse rate has been trending lower and he feels lightheaded. Frequently feels a fluttering in his chest   Current HTN meds:  Diltiazem  120mg  daily Nebivolol  5mg  daily Lisinopril  20mg  daily Spironolactone  12.5mg  daily  BP goal: <130/80   Wt Readings from Last 3 Encounters:  10/08/23 185 lb 12.8 oz (84.3 kg)  07/19/23 191 lb (86.6 kg)  01/22/23 186 lb 12.8 oz (84.7 kg)   BP Readings from Last 3 Encounters:  10/08/23 (!) 92/58  07/19/23 100/70  01/22/23 112/70   Pulse Readings from Last 3 Encounters:  10/08/23 63  07/19/23 (!) 59  01/22/23 (!) 58    Renal function: CrCl cannot be calculated (Patient's most recent lab result is older than the maximum 21 days allowed.).  Past Medical History:  Diagnosis Date   Arthritis    Cancer (HCC)    skin cancer   Dysrhythmia    afib   GERD (gastroesophageal reflux disease)    High cholesterol    History of kidney stones    Hx of echocardiogram 12/2010   EF 55%, There is Stage 1 diastolic dyfunction. Normal LV filling pressure. Aortic sclerosis with mild AI, mild TR   Hypertension    Ileus (HCC) 12/05/2018   Ileus, postoperative (HCC) 12/03/2018   Palpitation     with ecotopy   Sleep apnea    Traumatic closed fracture of C2 vertebra with minimal displacement (HCC) 06/03/2011    Current Outpatient Medications on File Prior to Visit  Medication Sig Dispense Refill   amiodarone  (PACERONE ) 200 MG tablet Take 1 tablet (200 mg total) by mouth daily. 90 tablet 2   apixaban  (ELIQUIS ) 2.5 MG TABS tablet TAKE ONE TABLET BY MOUTH TWICE DAILY 60 tablet 5   atorvastatin  (LIPITOR) 20 MG tablet TAKE 1 TABLET BY MOUTH ONCE DAILY 90 tablet 2   Cholecalciferol (VITAMIN D ) 2000 UNITS tablet Take 2,000 Units by mouth daily.     diltiazem  (CARDIZEM  CD) 120 MG 24 hr capsule TAKE 1 CAPSULE BY MOUTH AT BEDTIME 90 capsule 3   lisinopril  (ZESTRIL ) 20 MG tablet Take 1 tablet (20 mg total) by mouth daily. 30 tablet 3   Multiple Vitamins-Minerals (MULTIVITAMIN ADULT PO) Take 1 tablet by mouth daily.     nebivolol  (BYSTOLIC ) 10 MG tablet TAKE 1/2 TABLET BY MOUTH ONCE DAILY 45 tablet 2   NON FORMULARY CPAP therapy     Saccharomyces boulardii (PROBIOTIC) 250 MG CAPS Take 250 mg by mouth daily.     spironolactone  (ALDACTONE ) 25 MG tablet TAKE 1  TABLET ONCE DAILY 30 tablet 9   tamsulosin (FLOMAX) 0.4 MG CAPS capsule Take 0.4 mg by mouth daily.     No current facility-administered medications on file prior to visit.    No Known Allergies   Assessment/Plan:  1. Hypertension -  Patient BP in room 109/69 which is at goal of <130/80. Pulse rate of 58. Will reduce nebivolol  to 2.5mg  daily since he frequently feels lightheaded. Will refer to EP for frequent fluttering sensation in chest.  Continue: Lisinopril  20mg  daily Diltiazem  120mg  daily Spiro 12.5mg  daily Reduce nebivolol  to 2.5mg  daily   Chris Delvecchio Madole, PharmD, BCACP, CDCES, CPP [facility] [address], [place], [place] [address] Phone: [phone]; Fax: [phone] 11/20/2023 1:10 PM

## 2024-01-17 ENCOUNTER — Encounter: Payer: Self-pay | Admitting: Pharmacist

## 2024-01-22 ENCOUNTER — Ambulatory Visit: Admitting: Nurse Practitioner

## 2024-01-25 ENCOUNTER — Ambulatory Visit: Admitting: Cardiology

## 2024-01-30 ENCOUNTER — Ambulatory Visit: Admitting: Nurse Practitioner

## 2024-01-30 ENCOUNTER — Ambulatory Visit: Payer: Self-pay

## 2024-01-30 ENCOUNTER — Ambulatory Visit

## 2024-01-30 ENCOUNTER — Encounter: Payer: Self-pay | Admitting: Nurse Practitioner

## 2024-01-30 VITALS — BP 120/60 | HR 59 | Temp 98.0°F | Ht 70.0 in | Wt 187.0 lb

## 2024-01-30 VITALS — BP 120/60 | HR 59 | Temp 98.0°F | Ht 70.0 in | Wt 187.8 lb

## 2024-01-30 DIAGNOSIS — I10 Essential (primary) hypertension: Secondary | ICD-10-CM

## 2024-01-30 DIAGNOSIS — G4733 Obstructive sleep apnea (adult) (pediatric): Secondary | ICD-10-CM

## 2024-01-30 DIAGNOSIS — E782 Mixed hyperlipidemia: Secondary | ICD-10-CM | POA: Diagnosis not present

## 2024-01-30 DIAGNOSIS — I129 Hypertensive chronic kidney disease with stage 1 through stage 4 chronic kidney disease, or unspecified chronic kidney disease: Secondary | ICD-10-CM | POA: Diagnosis not present

## 2024-01-30 DIAGNOSIS — Z0000 Encounter for general adult medical examination without abnormal findings: Secondary | ICD-10-CM

## 2024-01-30 DIAGNOSIS — R972 Elevated prostate specific antigen [PSA]: Secondary | ICD-10-CM

## 2024-01-30 DIAGNOSIS — N183 Chronic kidney disease, stage 3 unspecified: Secondary | ICD-10-CM | POA: Diagnosis not present

## 2024-01-30 DIAGNOSIS — Z2821 Immunization not carried out because of patient refusal: Secondary | ICD-10-CM

## 2024-01-30 DIAGNOSIS — N1831 Chronic kidney disease, stage 3a: Secondary | ICD-10-CM

## 2024-01-30 NOTE — Patient Instructions (Signed)
 [name],  Thank you for taking the time for your Medicare Wellness Visit. I appreciate your continued commitment to your health goals. Please review the care plan we discussed, and feel free to reach out if I can assist you further.  Medicare recommends these wellness visits once per year to help you and your care team stay ahead of potential health issues. These visits are designed to focus on prevention, allowing your provider to concentrate on managing your acute and chronic conditions during your regular appointments.  Please note that Annual Wellness Visits do not include a physical exam. Some assessments may be limited, especially if the visit was conducted virtually. If needed, we may recommend a separate in-person follow-up with your provider.  Ongoing Care Seeing your primary care provider every 3 to 6 months helps us  monitor your health and provide consistent, personalized care.   Referrals If a referral was made during today's visit and you haven't received any updates within two weeks, please contact the referred provider directly to check on the status.  Recommended Screenings:  Health Maintenance  Topic Date Due   COVID-19 Vaccine (3 - Pfizer risk series) 02/15/2024*   Zoster (Shingles) Vaccine (1 of 2) 05/01/2024*   Flu Shot  07/01/2024*   DTaP/Tdap/Td vaccine (2 - Td or Tdap) 01/29/2025*   Pneumococcal Vaccine for age over 48 (1 of 2 - PCV) 01/29/2025*   Medicare Annual Wellness Visit  01/29/2025   Meningitis B Vaccine  Aged Out  *Topic was postponed. The date shown is not the original due date.       01/30/2024   11:43 AM  Advanced Directives  Does Patient Have a Medical Advance Directive? Yes  Type of Estate Agent of [place];Living will  Copy of Healthcare Power of Attorney in Chart? No - copy requested   Advance Care Planning is important because it: Ensures you receive medical care that aligns with your values, goals, and  preferences. Provides guidance to your family and loved ones, reducing the emotional burden of decision-making during critical moments.  Vision: Annual vision screenings are recommended for early detection of glaucoma, cataracts, and diabetic retinopathy. These exams can also reveal signs of chronic conditions such as diabetes and high blood pressure.  Dental: Annual dental screenings help detect early signs of oral cancer, gum disease, and other conditions linked to overall health, including heart disease and diabetes.  Please see the attached documents for additional preventive care recommendations.

## 2024-01-30 NOTE — Progress Notes (Signed)
 Subjective:   Mario Palmer is a 83 y.o. who presents for a Medicare Wellness preventive visit.  As a reminder, Annual Wellness Visits don't include a physical exam, and some assessments may be limited, especially if this visit is performed virtually. We may recommend an in-person follow-up visit with your provider if needed.  Visit Complete: In person    Persons Participating in Visit: Patient.  AWV Questionnaire: No: Patient Medicare AWV questionnaire was not completed prior to this visit.  Cardiac Risk Factors include: advanced age (>70men, >77 women);dyslipidemia;hypertension;male gender     Objective:    Today's Vitals   01/30/24 1137  BP: 120/60  Pulse: (!) 59  Temp: 98 F (36.7 C)  TempSrc: Oral  Weight: 187 lb (84.8 kg)  Height: 5' 10 (1.778 m)   Body mass index is 26.83 kg/m.     01/30/2024   11:43 AM 01/18/2023   12:06 PM 06/22/2021    4:07 PM 02/03/2021   10:32 AM 08/06/2019    9:16 AM 12/07/2018   10:42 AM 12/03/2018   10:29 AM  Advanced Directives  Does Patient Have a Medical Advance Directive? Yes Yes Yes Yes Yes  Yes  Type of Estate Agent of [place];Living will Healthcare Power of [place];Living will Healthcare Power of [place];Living will Healthcare Power of [place];Living will Healthcare Power of [place];Living will Healthcare Power of [place];Living will Healthcare Power of [place];Living will  Copy of Healthcare Power of Attorney in Chart? No - copy requested No - copy requested No - copy requested No - copy requested No - copy requested  No - copy requested, Physician notified    Current Medications (verified) Outpatient Encounter Medications as of 01/30/2024  Medication Sig   amiodarone  (PACERONE ) 200 MG tablet Take 1 tablet (200 mg total) by mouth daily.   apixaban  (ELIQUIS ) 2.5 MG TABS tablet TAKE ONE TABLET BY MOUTH TWICE DAILY   atorvastatin  (LIPITOR) 20 MG tablet TAKE 1 TABLET BY MOUTH ONCE DAILY    Cholecalciferol (VITAMIN D ) 2000 UNITS tablet Take 2,000 Units by mouth daily.   diltiazem  (CARDIZEM  CD) 120 MG 24 hr capsule TAKE 1 CAPSULE BY MOUTH AT BEDTIME   lisinopril  (ZESTRIL ) 20 MG tablet Take 1 tablet (20 mg total) by mouth daily.   Multiple Vitamins-Minerals (MULTIVITAMIN ADULT PO) Take 1 tablet by mouth daily.   nebivolol  (BYSTOLIC ) 2.5 MG tablet Take 1 tablet (2.5 mg total) by mouth daily.   NON FORMULARY CPAP therapy   Saccharomyces boulardii (PROBIOTIC) 250 MG CAPS Take 250 mg by mouth daily.   spironolactone  (ALDACTONE ) 25 MG tablet Take 0.5 tablets (12.5 mg total) by mouth daily.   tamsulosin (FLOMAX) 0.4 MG CAPS capsule Take 0.4 mg by mouth daily.   No facility-administered encounter medications on file as of 01/30/2024.    Allergies (verified) Patient has no known allergies.   History: Past Medical History:  Diagnosis Date   Arthritis    Cancer (HCC)    skin cancer   Dysrhythmia    afib   GERD (gastroesophageal reflux disease)    High cholesterol    History of kidney stones    Hx of echocardiogram 12/2010   EF 55%, There is Stage 1 diastolic dyfunction. Normal LV filling pressure. Aortic sclerosis with mild AI, mild TR   Hypertension    Ileus (HCC) 12/05/2018   Ileus, postoperative (HCC) 12/03/2018   Palpitation    with ecotopy   Sleep apnea    Traumatic closed fracture of C2 vertebra with  minimal displacement (HCC) 06/03/2011   Past Surgical History:  Procedure Laterality Date   CARDIOVERSION N/A 09/12/2016   Procedure: CARDIOVERSION;  Surgeon: Burnard Debby LABOR, MD;  Location: [facility] ENDOSCOPY;  Service: Cardiovascular;  Laterality: N/A;   CARDIOVERSION N/A 01/02/2023   Procedure: CARDIOVERSION;  Surgeon: Alvan Ronal BRAVO, MD;  Location: [facility] INVASIVE CV LAB;  Service: Cardiovascular;  Laterality: N/A;   EYE SURGERY Bilateral    cataract   INGUINAL HERNIA REPAIR Left 11/28/2018   Procedure: LAPAROSCOPIC  LEFT INGUINAL HERNIA WITH MESH;  Surgeon: Vernetta Berg, MD;  Location: [facility] OR;  Service: General;  Laterality: Left;   INGUINAL HERNIA REPAIR Right 02/10/2021   Procedure: HERNIA REPAIR RIGHT INGUINAL;  Surgeon: Vernetta Berg, MD;  Location: [facility] ORS;  Service: General;  Laterality: Right;   MOHS SURGERY Left    skin cancer remover from left side   RETINAL DETACHMENT SURGERY Right    RETINAL LASER PROCEDURE Right 04/2018   SKIN CANCER EXCISION  07/2018   TONSILLECTOMY     UMBILICAL HERNIA REPAIR N/A 11/28/2018   Procedure: OPEN UMBILICAL HERNIA REPAIR WITH MESH;  Surgeon: Vernetta Berg, MD;  Location: [facility] OR;  Service: General;  Laterality: N/A;   History reviewed. No pertinent family history. Social History   Socioeconomic History   Marital status: Married    Spouse name: Not on file   Number of children: Not on file   Years of education: Not on file   Highest education level: Not on file  Occupational History   Occupation: retired  Tobacco Use   Smoking status: Former    Types: Cigarettes   Smokeless tobacco: Never   Tobacco comments:    quit smoking in 1982  Vaping Use   Vaping status: Never Used  Substance and Sexual Activity   Alcohol use: No   Drug use: No   Sexual activity: Yes  Other Topics Concern   Not on file  Social History Narrative   Not on file   Social Drivers of Health   Financial Resource Strain: Low Risk  (01/30/2024)   Overall Financial Resource Strain (CARDIA)    Difficulty of Paying Living Expenses: Not hard at all  Food Insecurity: No Food Insecurity (01/30/2024)   Hunger Vital Sign    Worried About Running Out of Food in the Last Year: Never true    Ran Out of Food in the Last Year: Never true  Transportation Needs: No Transportation Needs (01/30/2024)   PRAPARE - Administrator, Civil Service (Medical): No    Lack of Transportation (Non-Medical): No  Physical Activity: Sufficiently Active (01/30/2024)   Exercise Vital Sign    Days of Exercise per Week: 5 days     Minutes of Exercise per Session: 50 min  Stress: No Stress Concern Present (01/30/2024)   [employer] of Occupational Health - Occupational Stress Questionnaire    Feeling of Stress: Not at all  Social Connections: Moderately Integrated (01/30/2024)   Social Connection and Isolation Panel    Frequency of Communication with Friends and Family: More than three times a week    Frequency of Social Gatherings with Friends and Family: Three times a week    Attends Religious Services: More than 4 times per year    Active Member of Clubs or Organizations: No    Attends Banker Meetings: Never    Marital Status: Married    Tobacco Counseling Counseling given: Not Answered Tobacco comments: quit smoking in 1982  Clinical Intake:  Pre-visit preparation completed: Yes  Pain : No/denies pain     Nutritional Status: BMI 25 -29 Overweight Nutritional Risks: None Diabetes: No  Lab Results  Component Value Date   HGBA1C 5.6 08/25/2020     How often do you need to have someone help you when you read instructions, pamphlets, or other written materials from your doctor or pharmacy?: 1 - Never  Interpreter Needed?: No  Information entered by :: NAllen LPN   Activities of Daily Living     01/30/2024   11:38 AM  In your present state of health, do you have any difficulty performing the following activities:  Hearing? 1  Comment has hearing aids  Vision? 0  Difficulty concentrating or making decisions? 0  Walking or climbing stairs? 0  Dressing or bathing? 0  Doing errands, shopping? 0  Preparing Food and eating ? N  Using the Toilet? N  In the past six months, have you accidently leaked urine? N  Do you have problems with loss of bowel control? N  Managing your Medications? N  Managing your Finances? N  Housekeeping or managing your Housekeeping? N    Patient Care Team: Georgina Speaks, FNP as PCP - General (General Practice) Burnard Debby LABOR, MD  (Inactive) as PCP - Cardiology (Cardiology) Octavia Charleston, MD as Consulting Physician (Ophthalmology) Elner Arley LABOR, MD as Consulting Physician (Ophthalmology)  I have updated your Care Teams any recent Medical Services you may have received from other providers in the past year.     Assessment:   This is a routine wellness examination for Mario Palmer.  Hearing/Vision screen Hearing Screening - Comments:: Has hearing aids that are maintained Vision Screening - Comments:: Regular eye exams, Dr. Elner   Goals Addressed             This Visit's Progress    Patient Stated       01/30/2024, lose some weight and increase exercise       Depression Screen     01/30/2024   11:44 AM 01/30/2024   11:14 AM 01/18/2023   12:08 PM 01/18/2023   11:45 AM 12/14/2021   10:35 AM 06/22/2021    4:08 PM 06/08/2021   10:05 AM  PHQ 2/9 Scores  PHQ - 2 Score 0 0 0 0 0 0 0  PHQ- 9 Score 0 0 0 0 0      Fall Risk     01/30/2024   11:43 AM 01/30/2024   11:14 AM 01/18/2023   12:07 PM 01/18/2023   11:45 AM 12/14/2021   10:34 AM  Fall Risk   Falls in the past year? 0 0 0 0   Number falls in past yr: 0 0 0 0 0  Injury with Fall? 0 0 0 0 0  Risk for fall due to : Medication side effect No Fall Risks Medication side effect No Fall Risks No Fall Risks  Follow up Falls evaluation completed;Falls prevention discussed Falls evaluation completed Falls prevention discussed;Falls evaluation completed Falls evaluation completed Falls evaluation completed      Data saved with a previous flowsheet row definition    MEDICARE RISK AT HOME:  Medicare Risk at Home Any stairs in or around the home?: Yes If so, are there any without handrails?: No Home free of loose throw rugs in walkways, pet beds, electrical cords, etc?: Yes Adequate lighting in your home to reduce risk of falls?: Yes Life alert?: No Use of a cane, walker or w/c?: No  Grab bars in the bathroom?: No Shower chair or bench in shower?:  Yes Elevated toilet seat or a handicapped toilet?: No  TIMED UP AND GO:  Was the test performed?  Yes  Length of time to ambulate 10 feet: 5 sec Gait steady and fast without use of assistive device  Cognitive Function: 6CIT completed        01/30/2024   11:44 AM 01/18/2023   12:08 PM 06/22/2021    4:09 PM 08/06/2019    9:19 AM 08/01/2018   10:19 AM  6CIT Screen  What Year? 0 points 0 points 0 points 0 points 0 points  What month? 0 points 0 points 0 points 0 points 0 points  What time? 0 points 0 points 0 points 0 points 0 points  Count back from 20 0 points 0 points 0 points 0 points 0 points  Months in reverse 0 points 2 points 0 points 0 points 0 points  Repeat phrase 0 points 2 points 0 points 0 points 0 points  Total Score 0 points 4 points 0 points 0 points 0 points    Immunizations Immunization History  Administered Date(s) Administered   PFIZER(Purple Top)SARS-COV-2 Vaccination 06/16/2019, 07/07/2019   Tdap 06/03/2011    Screening Tests Health Maintenance  Topic Date Due   COVID-19 Vaccine (3 - Pfizer risk series) 02/15/2024 (Originally 08/04/2019)   Zoster Vaccines- Shingrix (1 of 2) 05/01/2024 (Originally 12/24/1959)   Influenza Vaccine  07/01/2024 (Originally 11/02/2023)   DTaP/Tdap/Td (2 - Td or Tdap) 01/29/2025 (Originally 06/02/2021)   Pneumococcal Vaccine: 50+ Years (1 of 2 - PCV) 01/29/2025 (Originally [birth year]24/1959)   Medicare Annual Wellness (AWV)  01/29/2025   Meningococcal B Vaccine  Aged Out    Health Maintenance Items Addressed: Declines vaccines  Additional Screening:  Vision Screening: Recommended annual ophthalmology exams for early detection of glaucoma and other disorders of the eye. Is the patient up to date with their annual eye exam?  Yes  Who is the provider or what is the name of the office in which the patient attends annual eye exams? Dr. Elner  Dental Screening: Recommended annual dental exams for proper oral hygiene  Community Resource  Referral / Chronic Care Management: CRR required this visit?  No   CCM required this visit?  No   Plan:    I have personally reviewed and noted the following in the patient's chart:   Medical and social history Use of alcohol, tobacco or illicit drugs  Current medications and supplements including opioid prescriptions. Patient is not currently taking opioid prescriptions. Functional ability and status Nutritional status Physical activity Advanced directives List of other physicians Hospitalizations, surgeries, and ER visits in previous 12 months Vitals Screenings to include cognitive, depression, and falls Referrals and appointments  In addition, I have reviewed and discussed with patient certain preventive protocols, quality metrics, and best practice recommendations. A written personalized care plan for preventive services as well as general preventive health recommendations were provided to patient.   Ardella FORBES Dawn, LPN   89/70/7974   After Visit Summary: (In Person-Printed) AVS printed and given to the patient  Notes: Nothing significant to report at this time.

## 2024-01-30 NOTE — Progress Notes (Signed)
 Mario Palmer, CMA,acting as a neurosurgeon for Mario Ada, FNP.,have documented all relevant documentation on the behalf of Mario Ada, FNP,as directed by  Mario Ada, FNP while in the presence of Mario Ada, FNP.  Subjective:  Patient ID: Mario Palmer , male    DOB: 16-Feb-1941 , 83 y.o.   MRN: [id number]  Chief Complaint  Patient presents with   Hypertension    Patient presents today for a bp and chol follow up, Patient reports compliance with medication. Patient denies any chest pain, SOB, or headaches. Patient has no concerns today.      HPI  Discussed the use of AI scribe software for clinical note transcription with the patient, who gave verbal consent to proceed.  History of Present Illness Mario Palmer is an 83 year old male who presents for a follow-up visit regarding blood pressure management and elevated PSA levels.  He is now under the care of a new cardiologist. During his last visit in July, his blood pressure was 92/58, and one of his blood pressure medications was reduced by half. He has been monitoring his blood pressure daily, and it has improved to 120/60 today.  His urologist found elevated PSA levels during a test in August.  He uses a CPAP machine for sleep apnea but experiences issues with mouth breathing and waking up with a dry mouth. An appointment is scheduled in January to address these concerns.  No significant swelling in his feet or ankles, noting only very little swelling. He has not received a flu shot and does not wish to get one.   Past Medical History:  Diagnosis Date   Arthritis    Cancer (HCC)    skin cancer   Dysrhythmia    afib   GERD (gastroesophageal reflux disease)    High cholesterol    History of kidney stones    Hx of echocardiogram 12/2010   EF 55%, There is Stage 1 diastolic dyfunction. Normal LV filling pressure. Aortic sclerosis with mild AI, mild TR   Hypertension    Ileus (HCC) 12/05/2018   Ileus,  postoperative (HCC) 12/03/2018   Palpitation    with ecotopy   Sleep apnea    Traumatic closed fracture of C2 vertebra with minimal displacement (HCC) 06/03/2011     History reviewed. No pertinent family history.   Current Outpatient Medications:    amiodarone  (PACERONE ) 200 MG tablet, Take 1 tablet (200 mg total) by mouth daily., Disp: 90 tablet, Rfl: 3   apixaban  (ELIQUIS ) 2.5 MG TABS tablet, TAKE ONE TABLET BY MOUTH TWICE DAILY, Disp: 60 tablet, Rfl: 5   Cholecalciferol (VITAMIN D ) 2000 UNITS tablet, Take 2,000 Units by mouth daily., Disp: , Rfl:    diltiazem  (CARDIZEM  CD) 120 MG 24 hr capsule, TAKE 1 CAPSULE BY MOUTH AT BEDTIME, Disp: 90 capsule, Rfl: 3   lisinopril  (ZESTRIL ) 20 MG tablet, Take 1 tablet (20 mg total) by mouth daily., Disp: 30 tablet, Rfl: 3   Multiple Vitamins-Minerals (MULTIVITAMIN ADULT PO), Take 1 tablet by mouth daily., Disp: , Rfl:    nebivolol  (BYSTOLIC ) 2.5 MG tablet, Take 1 tablet (2.5 mg total) by mouth daily., Disp: 90 tablet, Rfl: 1   NON FORMULARY, CPAP therapy, Disp: , Rfl:    Saccharomyces boulardii (PROBIOTIC) 250 MG CAPS, Take 250 mg by mouth daily., Disp: , Rfl:    spironolactone  (ALDACTONE ) 25 MG tablet, Take 0.5 tablets (12.5 mg total) by mouth daily., Disp: , Rfl:    tamsulosin (FLOMAX) 0.4 MG  CAPS capsule, Take 0.4 mg by mouth daily., Disp: , Rfl:    atorvastatin  (LIPITOR) 20 MG tablet, Take 1 tablet (20 mg total) by mouth daily., Disp: 90 tablet, Rfl: 2   No Known Allergies   Review of Systems  Constitutional: Negative.   HENT: Negative.    Eyes: Negative.   Respiratory: Negative.    Cardiovascular: Negative.   Gastrointestinal: Negative.   Neurological: Negative.   Psychiatric/Behavioral: Negative.       Today's Vitals   01/30/24 1111  BP: 120/60  Pulse: (!) 59  Temp: 98 F (36.7 C)  TempSrc: Oral  Weight: 187 lb 12.8 oz (85.2 kg)  Height: 5' 10 (1.778 m)  PainSc: 0-No pain   Body mass index is 26.95 kg/m.  Wt Readings  from Last 3 Encounters:  01/30/24 187 lb (84.8 kg)  01/30/24 187 lb 12.8 oz (85.2 kg)  10/08/23 185 lb 12.8 oz (84.3 kg)     Objective:  Physical Exam Vitals and nursing note reviewed.  Constitutional:      General: He is not in acute distress.    Appearance: Normal appearance.  HENT:     Ears:     Comments: Wearing bilateral hearing aids.  Cardiovascular:     Rate and Rhythm: Normal rate and regular rhythm.     Pulses: Normal pulses.     Heart sounds: Normal heart sounds. No murmur heard. Pulmonary:     Effort: Pulmonary effort is normal. No respiratory distress.     Breath sounds: Normal breath sounds. No wheezing.  Skin:    General: Skin is warm and dry.     Capillary Refill: Capillary refill takes less than 2 seconds.     Comments: Skin has multiple areas to hands of superficial bruising (elderly)  Neurological:     General: No focal deficit present.     Mental Status: He is alert and oriented to person, place, and time.  Psychiatric:        Mood and Affect: Mood normal.        Behavior: Behavior normal.        Thought Content: Thought content normal.        Judgment: Judgment normal.      Assessment And Plan:  Mixed hyperlipidemia Assessment & Plan: Cholesterol levels are normal.  Continue statin, tolerating well.  Orders: -     Lipid panel  Influenza vaccination declined  Tetanus, diphtheria, and acellular pertussis (Tdap) vaccination declined  Herpes zoster vaccination declined  OSA on CPAP Assessment & Plan: Continues CPAP therapy with mouth breathing and dry mouth issues. - Appointment with Mario Palmer in January for CPAP issues.   Elevated PSA Assessment & Plan: PSA levels elevated in August, under urology evaluation. - Re-evaluate PSA levels in four months. - Send PSA results to urologist.  Orders: -     PSA  Benign hypertension with CKD (chronic kidney disease) stage III (HCC) Assessment & Plan: Blood pressure controlled at 120/60 mmHg  after medication adjustment by cardiologist.  Orders: -     BMP8+eGFR    Return for 6 month bp check, needs AWV.  Patient was given opportunity to ask questions. Patient verbalized understanding of the plan and was able to repeat key elements of the plan. All questions were answered to their satisfaction.    Mario Mario Ada, FNP, have reviewed all documentation for this visit. The documentation on 01/30/24 for the exam, diagnosis, procedures, and orders are all accurate and complete.   IF  YOU HAVE BEEN REFERRED TO A SPECIALIST, IT MAY TAKE 1-2 WEEKS TO SCHEDULE/PROCESS THE REFERRAL. IF YOU HAVE NOT HEARD FROM US /SPECIALIST IN TWO WEEKS, PLEASE GIVE US  A CALL AT [phone].

## 2024-01-31 ENCOUNTER — Ambulatory Visit: Payer: Self-pay | Admitting: Nurse Practitioner

## 2024-01-31 LAB — BMP8+EGFR
BUN/Creatinine Ratio: 21 (ref 10–24)
BUN: 31 mg/dL — ABNORMAL HIGH (ref 8–27)
CO2: 22 mmol/L (ref 20–29)
Calcium: 9.7 mg/dL (ref 8.6–10.2)
Chloride: 103 mmol/L (ref 96–106)
Creatinine, Ser: 1.5 mg/dL — ABNORMAL HIGH (ref 0.76–1.27)
Glucose: 71 mg/dL (ref 70–99)
Potassium: 5.3 mmol/L — ABNORMAL HIGH (ref 3.5–5.2)
Sodium: 140 mmol/L (ref 134–144)
eGFR: 45 mL/min/1.73 — ABNORMAL LOW (ref >59)

## 2024-01-31 LAB — LIPID PANEL
Chol/HDL Ratio: 2.6 ratio (ref 0.0–5.0)
Cholesterol, Total: 147 mg/dL (ref 100–199)
HDL: 57 mg/dL (ref >39)
LDL Chol Calc (NIH): 69 mg/dL (ref 0–99)
Triglycerides: 119 mg/dL (ref 0–149)
VLDL Cholesterol Cal: 21 mg/dL (ref 5–40)

## 2024-01-31 LAB — PSA: Prostate Specific Ag, Serum: 20.3 ng/mL — ABNORMAL HIGH (ref 0.0–4.0)

## 2024-01-31 NOTE — Progress Notes (Signed)
 Please see most recent PSA. Thanks

## 2024-01-31 NOTE — Progress Notes (Signed)
 Hi Dr. McDiarmid, You are absolutely correct, my apologies. Thank you for letting me know.   Mario Palmer

## 2024-02-01 ENCOUNTER — Other Ambulatory Visit: Payer: Self-pay | Admitting: General Practice

## 2024-02-01 MED ORDER — ATORVASTATIN CALCIUM 20 MG PO TABS: 20.0000 mg | ORAL_TABLET | Freq: Every day | ORAL | 2 refills | Status: AC

## 2024-02-04 NOTE — Progress Notes (Signed)
 Okay thank you.   @Ciera  please call the patient to make him aware to call the Urologist (Dr. Shane at [facility] office for follow up since his PSA is at 20.3

## 2024-02-06 LAB — PE AND FLC, SERUM
A/G Ratio: 1.2 (ref 0.7–1.7)
Albumin ELP: 3.6 g/dL (ref 2.9–4.4)
Alpha 1: 0.2 g/dL (ref 0.0–0.4)
Alpha 2: 0.9 g/dL (ref 0.4–1.0)
Beta: 0.8 g/dL (ref 0.7–1.3)
Gamma Globulin: 1.1 g/dL (ref 0.4–1.8)
Globulin, Total: 3 g/dL (ref 2.2–3.9)
Ig Kappa Free Light Chain: 33 mg/L — ABNORMAL HIGH (ref 3.3–19.4)
Ig Lambda Free Light Chain: 20.2 mg/L (ref 5.7–26.3)
KAPPA/LAMBDA RATIO: 1.63 (ref 0.26–1.65)
Total Protein: 6.6 g/dL (ref 6.0–8.5)

## 2024-02-06 LAB — PTH, INTACT AND CALCIUM: Calcium: 9.8 mg/dL (ref 8.6–10.2)

## 2024-02-06 LAB — SPECIMEN STATUS REPORT

## 2024-02-06 NOTE — Assessment & Plan Note (Signed)
 Continues CPAP therapy with mouth breathing and dry mouth issues. - Appointment with Randine Bihari in January for CPAP issues.

## 2024-02-06 NOTE — Assessment & Plan Note (Signed)
 PSA levels elevated in August, under urology evaluation. - Re-evaluate PSA levels in four months. - Send PSA results to urologist.

## 2024-02-06 NOTE — Assessment & Plan Note (Signed)
Cholesterol levels are normal.  Continue statin, tolerating well.

## 2024-02-06 NOTE — Assessment & Plan Note (Signed)
 Blood pressure controlled at 120/60 mmHg after medication adjustment by cardiologist.

## 2024-03-11 ENCOUNTER — Other Ambulatory Visit: Payer: Self-pay | Admitting: Cardiovascular Disease

## 2024-03-11 DIAGNOSIS — I10 Essential (primary) hypertension: Secondary | ICD-10-CM

## 2024-03-11 NOTE — Progress Notes (Unsigned)
  Electrophysiology Office Note:   Date:  03/11/2024  ID:  Mario Palmer, DOB Sep 09, 1940, MRN [id number]  Primary Cardiologist: Debby Sor, MD (Inactive) Primary Heart Failure: None Electrophysiologist: None  {Click to update primary MD,subspecialty MD or APP then REFRESH:1}    History of Present Illness:   Mario Palmer is a 83 y.o. male with h/o atrial fibrillation, hypertension, hyperlipidemia, sleep apnea seen today for  for Electrophysiology evaluation of atrial fibrillation at the request of Dorn Lesches .    Discussed the use of AI scribe software for clinical note transcription with the patient, who gave verbal consent to proceed.  History of Present Illness    Review of systems complete and found to be negative unless listed in HPI.   EP Information / Studies Reviewed:    {EKGtoday:28818}        Risk Assessment/Calculations:    CHA2DS2-VASc Score =    {Confirm score is correct.  If not, click here to update score.  REFRESH note.  :1} This indicates a  % annual risk of stroke. The patient's score is based upon:    {This patient has a significant risk of stroke if diagnosed with atrial fibrillation.  Please consider VKA or DOAC agent for anticoagulation if the bleeding risk is acceptable.   You can also use the SmartPhrase .[email or web address] for documentation.   :[id number]}         Physical Exam:   VS:  There were no vitals taken for this visit.   Wt Readings from Last 3 Encounters:  01/30/24 187 lb (84.8 kg)  01/30/24 187 lb 12.8 oz (85.2 kg)  10/08/23 185 lb 12.8 oz (84.3 kg)     GEN: Well nourished, well developed in no acute distress NECK: No JVD; No carotid bruits CARDIAC: {EPRHYTHM:28826}, no murmurs, rubs, gallops RESPIRATORY:  Clear to auscultation without rales, wheezing or rhonchi  ABDOMEN: Soft, non-tender, non-distended EXTREMITIES:  No edema; No deformity   ASSESSMENT AND PLAN:    1.  Paroxysmal atrial fibrillation: Most recent  cardioversion 01/02/2023.***  2.  Secondary hypercoagulable state: On Eliquis   3.  Obstructive sleep apnea: CPAP compliance encouraged  4.  Hypertension:***  Follow up with {EPMDS:28135::EP Team} {EPFOLLOW LE:71826}  Signed, Hoorain Kozakiewicz Gladis Norton, MD

## 2024-03-12 ENCOUNTER — Ambulatory Visit: Attending: Cardiology | Admitting: Cardiology

## 2024-03-12 ENCOUNTER — Encounter: Payer: Self-pay | Admitting: Cardiology

## 2024-03-12 VITALS — BP 130/78 | HR 58 | Ht 70.0 in | Wt 185.0 lb

## 2024-03-12 DIAGNOSIS — I480 Paroxysmal atrial fibrillation: Secondary | ICD-10-CM

## 2024-03-12 DIAGNOSIS — G4733 Obstructive sleep apnea (adult) (pediatric): Secondary | ICD-10-CM

## 2024-03-12 DIAGNOSIS — I129 Hypertensive chronic kidney disease with stage 1 through stage 4 chronic kidney disease, or unspecified chronic kidney disease: Secondary | ICD-10-CM | POA: Diagnosis not present

## 2024-03-12 DIAGNOSIS — D6869 Other thrombophilia: Secondary | ICD-10-CM | POA: Diagnosis not present

## 2024-03-12 DIAGNOSIS — N183 Chronic kidney disease, stage 3 unspecified: Secondary | ICD-10-CM

## 2024-03-12 NOTE — Patient Instructions (Addendum)
 Medication Instructions:  Your physician recommends that you continue on your current medications as directed. Please refer to the Current Medication list given to you today.  *If you need a refill on your cardiac medications before your next appointment, please call your pharmacy*  Lab Work: None ordered  If you have any lab test that is abnormal or we need to change your treatment, we will call you to review the results.  Testing/Procedures:                           Mario Palmer- Long Term Monitor Instructions  Your physician has requested you wear a ZIO patch monitor for 2 weeks.  This is a single patch monitor. [employer] supplies one patch monitor per enrollment. Additional stickers are not available. Please do not apply patch if you will be having a Nuclear Stress Test,  Echocardiogram, Cardiac CT, MRI, or Chest Xray during the period you would be wearing the  monitor. The patch cannot be worn during these tests. You cannot remove and re-apply the  ZIO XT patch monitor.  Your ZIO patch monitor will be mailed 3 day USPS to your address on file. It may take 3-5 days  to receive your monitor after you have been enrolled.  Once you have received your monitor, please review the enclosed instructions. Your monitor  has already been registered assigning a specific monitor serial # to you.  Billing and Patient Assistance Program Information  We have supplied [employer] with any of your insurance information on file for billing purposes. [employer] offers a sliding scale Patient Assistance Program for patients that do not have  insurance, or whose insurance does not completely cover the cost of the ZIO monitor.  You must apply for the Patient Assistance Program to qualify for this discounted rate.  To apply, please call [employer] at [phone], select option 4, select option 2, ask to apply for  Patient Assistance Program. [name] will ask your household income, and how many people  are in your  household. They will quote your out-of-pocket cost based on that information.  [employer] will also be able to set up a 71-month, interest-free payment plan if needed.  Applying the monitor   Shave hair from upper left chest.  Hold abrader disc by orange tab. Rub abrader in 40 strokes over the upper left chest as  indicated in your monitor instructions.  Clean area with 4 enclosed alcohol pads. Let dry.  Apply patch as indicated in monitor instructions. Patch will be placed under collarbone on left  side of chest with arrow pointing upward.  Rub patch adhesive wings for 2 minutes. Remove white label marked 1. Remove the white  label marked 2. Rub patch adhesive wings for 2 additional minutes.  While looking in a mirror, press and release button in center of patch. A small green light will  flash 3-4 times. This will be your only indicator that the monitor has been turned on.  Do not shower for the first 24 hours. You may shower after the first 24 hours.  Press the button if you feel a symptom. You will hear a small click. Record Date, Time and  Symptom in the Patient Logbook.  When you are ready to remove the patch, follow instructions on the last 2 pages of Patient  Logbook. Stick patch monitor onto the last page of Patient Logbook.  Place Patient Logbook in the blue and white box. Use locking tab  on box and tape box closed  securely. The blue and white box has prepaid postage on it. Please place it in the mailbox as  soon as possible. Your physician should have your test results approximately 7 days after the  monitor has been mailed back to [facility].  Call [facility] Customer Care at [phone] if you have questions regarding  your ZIO XT patch monitor. Call them immediately if you see an orange light blinking on your  monitor.  If your monitor falls off in less than 4 days, contact our Monitor department at [phone].  If your monitor becomes loose or falls off after  4 days call [employer] at [phone] for  suggestions on securing your monitor   Follow-Up: At [facility], you and your health needs are our priority.  As part of our continuing mission to provide you with exceptional heart care, our providers are all part of one team.  This team includes your primary Cardiologist (physician) and Advanced Practice Providers or APPs (Physician Assistants and Nurse Practitioners) who all work together to provide you with the care you need, when you need it.  Your next appointment:   To be determined after the monitor  Provider:   You will see one of the following Advanced Practice Providers on your designated Care Team:   Charlies Arthur, PA-C Michael Andy Tillery, PA-C Suzann Riddle, NP Daphne Barrack, NP Artist Pouch, PA-C   Thank you for choosing [facility]!!   Maeola Domino, RN [phone]

## 2024-03-13 MED ORDER — LISINOPRIL 20 MG PO TABS: 20.0000 mg | ORAL_TABLET | Freq: Every day | ORAL | 2 refills | Status: AC

## 2024-03-24 ENCOUNTER — Other Ambulatory Visit: Payer: Self-pay

## 2024-03-24 DIAGNOSIS — I480 Paroxysmal atrial fibrillation: Secondary | ICD-10-CM

## 2024-03-24 MED ORDER — APIXABAN 2.5 MG PO TABS: 2.5000 mg | ORAL_TABLET | Freq: Two times a day (BID) | ORAL | 5 refills | Status: AC

## 2024-03-24 NOTE — Telephone Encounter (Signed)
 Eliquis  2.5mg  refill request received. Patient is 83 years old, weight-83.9kg, Crea-1.50 on 01/30/24, Diagnosis-Afib, and last seen by Dr. Inocencio on 03/12/24. Dose is appropriate based on dosing criteria. Will send in refill to requested pharmacy.

## 2024-03-31 ENCOUNTER — Other Ambulatory Visit: Payer: Self-pay

## 2024-03-31 DIAGNOSIS — I10 Essential (primary) hypertension: Secondary | ICD-10-CM

## 2024-03-31 DIAGNOSIS — I4892 Unspecified atrial flutter: Secondary | ICD-10-CM

## 2024-04-01 MED ORDER — NEBIVOLOL HCL 2.5 MG PO TABS: 2.5000 mg | ORAL_TABLET | Freq: Every day | ORAL | 2 refills | Status: AC

## 2024-04-02 ENCOUNTER — Telehealth: Payer: Self-pay

## 2024-04-02 NOTE — Telephone Encounter (Signed)
 Mario Palmer

## 2024-04-04 ENCOUNTER — Ambulatory Visit: Admitting: Cardiology

## 2024-04-04 ENCOUNTER — Telehealth: Payer: Self-pay | Admitting: *Deleted

## 2024-04-04 ENCOUNTER — Encounter: Payer: Self-pay | Admitting: Cardiology

## 2024-04-04 VITALS — BP 110/60 | HR 60 | Ht 70.0 in | Wt 186.0 lb

## 2024-04-04 DIAGNOSIS — I10 Essential (primary) hypertension: Secondary | ICD-10-CM

## 2024-04-04 DIAGNOSIS — G4733 Obstructive sleep apnea (adult) (pediatric): Secondary | ICD-10-CM

## 2024-04-04 NOTE — Patient Instructions (Signed)
 Medication Instructions:  Your physician recommends that you continue on your current medications as directed. Please refer to the Current Medication list given to you today.  *If you need a refill on your cardiac medications before your next appointment, please call your pharmacy*  Lab Work: NONE If you have labs (blood work) drawn today and your tests are completely normal, you will receive your results only by: MyChart Message (if you have MyChart) OR A paper copy in the mail If you have any lab test that is abnormal or we need to change your treatment, we will call you to review the results.  Testing/Procedures: NONE  Follow-Up: At [facility], you and your health needs are our priority.  As part of our continuing mission to provide you with exceptional heart care, our providers are all part of one team.  This team includes your primary Cardiologist (physician) and Advanced Practice Providers or APPs (Physician Assistants and Nurse Practitioners) who all work together to provide you with the care you need, when you need it.  Your next appointment:   1 year(s)  Provider:   Wilbert Bihari, MD

## 2024-04-04 NOTE — Addendum Note (Signed)
 Addended by: JOSHUA BRAD MATSU on: 04/04/2024 05:20 PM   Modules accepted: Orders

## 2024-04-04 NOTE — Telephone Encounter (Signed)
 DME=[facility]  Upon patient request DME selection is [facility].

## 2024-04-04 NOTE — Progress Notes (Signed)
 "   SLEEP MEDICINE OFFICE VISIT NOTE:   Date:  04/04/2024   ID:  Mario Palmer, DOB January 24, 1941, MRN [id number]  PCP:  Georgina Speaks, FNP  Cardiologist:  Dorn Lesches, MD    Referring MD: Georgina Speaks, FNP   Chief Complaint  Patient presents with   Sleep Apnea   Hypertension    History of Present Illness:    Mario Palmer is a 84 y.o. male with a hx of GERD, hyperlipidemia, hypertension and atrial fibrillation.  He also has a history of obstructive sleep apnea on CPAP.  He has been followed by Dr. Burnard in the past for his CPAP and was last seen by him 01/21/2023.  He had his initial sleep study in 2010 and was found to have mild sleep apnea worse in the supine position and REM sleep.  He has been on CPAP therapy since that time and initially started on 8 cm H2O and then increase to 10 cm H2O.  He received a new AirSense 10 CPAP unit in 2017.  He received a new AirSense 11 AutoSet and February 2024 and was set on auto CPAP 11 to 16 cm H2O.  Ultimately he was changed to 12-18cm H2O.  He is doing well with his PAP device and thinks that he has gotten used to it.  He tolerates the full face mask and feels the pressure is adequate.  Since going on PAP he feels rested in the am but still gets sleepy some during the day after exercise and will take a nap.   He denies any significant  nasal dryness or nasal congestion. He is complaining of mouth dryness and this has prevented him from using his device as much as he needs too because the dry mouth is so bad.  He does not think that he snores. An Epworth Sleepiness Scale score was calculated the office today and this endorsed at 2 arguing against residual daytime sleepiness. Patient denies any episodes of bruxism, restless legs, no hyponogognic hallucinations or cataplectic events.    Past Medical History:  Diagnosis Date   Arthritis    Cancer (HCC)    skin cancer   Dysrhythmia    afib   GERD (gastroesophageal reflux disease)     High cholesterol    History of kidney stones    Hx of echocardiogram 12/2010   EF 55%, There is Stage 1 diastolic dyfunction. Normal LV filling pressure. Aortic sclerosis with mild AI, mild TR   Hypertension    Ileus (HCC) 12/05/2018   Ileus, postoperative (HCC) 12/03/2018   Palpitation    with ecotopy   Sleep apnea    Traumatic closed fracture of C2 vertebra with minimal displacement (HCC) 06/03/2011    Past Surgical History:  Procedure Laterality Date   CARDIOVERSION N/A 09/12/2016   Procedure: CARDIOVERSION;  Surgeon: Burnard Debby LABOR, MD;  Location: [facility] ENDOSCOPY;  Service: Cardiovascular;  Laterality: N/A;   CARDIOVERSION N/A 01/02/2023   Procedure: CARDIOVERSION;  Surgeon: Alvan Ronal BRAVO, MD;  Location: [facility] INVASIVE CV LAB;  Service: Cardiovascular;  Laterality: N/A;   EYE SURGERY Bilateral    cataract   INGUINAL HERNIA REPAIR Left 11/28/2018   Procedure: LAPAROSCOPIC  LEFT INGUINAL HERNIA WITH MESH;  Surgeon: Vernetta Berg, MD;  Location: [facility] OR;  Service: General;  Laterality: Left;   INGUINAL HERNIA REPAIR Right 02/10/2021   Procedure: HERNIA REPAIR RIGHT INGUINAL;  Surgeon: Vernetta Berg, MD;  Location: [facility] ORS;  Service: General;  Laterality: Right;  MOHS SURGERY Left    skin cancer remover from left side   RETINAL DETACHMENT SURGERY Right    RETINAL LASER PROCEDURE Right 04/2018   SKIN CANCER EXCISION  07/2018   TONSILLECTOMY     UMBILICAL HERNIA REPAIR N/A 11/28/2018   Procedure: OPEN UMBILICAL HERNIA REPAIR WITH MESH;  Surgeon: Vernetta Berg, MD;  Location: [facility] OR;  Service: General;  Laterality: N/A;    Current Medications: Active Medications[1]   Allergies:   Patient has no known allergies.   Social History   Socioeconomic History   Marital status: Married    Spouse name: Not on file   Number of children: Not on file   Years of education: Not on file   Highest education level: Not on file  Occupational History   Occupation: retired  Tobacco Use    Smoking status: Former    Types: Cigarettes   Smokeless tobacco: Never   Tobacco comments:    quit smoking in 1982  Vaping Use   Vaping status: Never Used  Substance and Sexual Activity   Alcohol use: No   Drug use: No   Sexual activity: Yes  Other Topics Concern   Not on file  Social History Narrative   Not on file   Social Drivers of Health   Tobacco Use: Medium Risk (04/04/2024)   Patient History    Smoking Tobacco Use: Former    Smokeless Tobacco Use: Never    Passive Exposure: Not on Actuary Strain: Low Risk (01/30/2024)   Overall Financial Resource Strain (CARDIA)    Difficulty of Paying Living Expenses: Not hard at all  Food Insecurity: No Food Insecurity (01/30/2024)   Epic    Worried About Radiation Protection Practitioner of Food in the Last Year: Never true    Ran Out of Food in the Last Year: Never true  Transportation Needs: No Transportation Needs (01/30/2024)   Epic    Lack of Transportation (Medical): No    Lack of Transportation (Non-Medical): No  Physical Activity: Sufficiently Active (01/30/2024)   Exercise Vital Sign    Days of Exercise per Week: 5 days    Minutes of Exercise per Session: 50 min  Stress: No Stress Concern Present (01/30/2024)   [employer] of Occupational Health - Occupational Stress Questionnaire    Feeling of Stress: Not at all  Social Connections: Moderately Integrated (01/30/2024)   Social Connection and Isolation Panel    Frequency of Communication with Friends and Family: More than three times a week    Frequency of Social Gatherings with Friends and Family: Three times a week    Attends Religious Services: More than 4 times per year    Active Member of Clubs or Organizations: No    Attends Banker Meetings: Never    Marital Status: Married  Depression (PHQ2-9): Low Risk (01/30/2024)   Depression (PHQ2-9)    PHQ-2 Score: 0  Alcohol Screen: Low Risk (01/30/2024)   Alcohol Screen    Last Alcohol Screening Score  (AUDIT): 0  Housing: Unknown (01/30/2024)   Epic    Unable to Pay for Housing in the Last Year: No    Number of Times Moved in the Last Year: Not on file    Homeless in the Last Year: No  Utilities: Not At Risk (01/30/2024)   Epic    Threatened with loss of utilities: No  Health Literacy: Adequate Health Literacy (01/30/2024)   B1300 Health Literacy    Frequency of need for help  with medical instructions: Never     Family History: The patient's family history is not on file.  ROS:   Please see the history of present illness.    ROS  All other systems reviewed and negative.   EKGs/Labs/Other Studies Reviewed:    The following studies were reviewed today: PAP compliance download     Physical Exam:    VS:  BP 110/60   Pulse 60   Ht 5' 10 (1.778 m)   Wt 186 lb (84.4 kg)   SpO2 97%   BMI 26.69 kg/m     Wt Readings from Last 3 Encounters:  04/04/24 186 lb (84.4 kg)  03/12/24 185 lb (83.9 kg)  01/30/24 187 lb (84.8 kg)      ASSESSMENT:    1. OSA (obstructive sleep apnea)   2. Essential hypertension    PLAN:    In order of problems listed above:  OSA - The patient is tolerating PAP therapy well without any problems. The PAP download performed by his DME was personally reviewed and interpreted by me today and showed an AHI of 3.1 /hr on auto CPAP from 12-18 cm H2O with 33% compliance in using more than 4 hours nightly.  The patient has been using and benefiting from PAP use and will continue to benefit from therapy.  -he only used his device 14/30 days last month due to severe dry mouth -I encouraged him to be more compliant with his device -I helped him adjust the humidity in his device  Hypertension -BP controlled on exam -Continue Cardizem  CD 120 mg daily, Zestril  20 mg daily, Bystolic  2.5 mg daily, Aldactone  12.5 mg daily with as needed refills -I have personally reviewed and interpreted outside labs performed by patient's PCP which showed serum creatinine  1.5 and potassium 5.3 on 01/30/2024    Time Spent: 20 minutes total time of encounter, including 15 minutes spent in face-to-face patient care on the date of this encounter. This time includes coordination of care and counseling regarding above mentioned problem list. Remainder of non-face-to-face time involved reviewing chart documents/testing relevant to the patient encounter and documentation in the medical record. I have independently reviewed documentation from referring provider  Medication Adjustments/Labs and Tests Ordered: Current medicines are reviewed at length with the patient today.  Concerns regarding medicines are outlined above.  No orders of the defined types were placed in this encounter.  No orders of the defined types were placed in this encounter.   Signed, Wilbert Bihari, MD  04/04/2024 2:28 PM    [facility]     [1]  Current Meds  Medication Sig   amiodarone  (PACERONE ) 200 MG tablet Take 1 tablet (200 mg total) by mouth daily.   apixaban  (ELIQUIS ) 2.5 MG TABS tablet Take 1 tablet (2.5 mg total) by mouth 2 (two) times daily.   atorvastatin  (LIPITOR) 20 MG tablet Take 1 tablet (20 mg total) by mouth daily.   Cholecalciferol (VITAMIN D ) 2000 UNITS tablet Take 2,000 Units by mouth daily.   diltiazem  (CARDIZEM  CD) 120 MG 24 hr capsule TAKE 1 CAPSULE BY MOUTH AT BEDTIME   lisinopril  (ZESTRIL ) 20 MG tablet Take 1 tablet (20 mg total) by mouth daily.   Multiple Vitamins-Minerals (MULTIVITAMIN ADULT PO) Take 1 tablet by mouth daily.   nebivolol  (BYSTOLIC ) 2.5 MG tablet Take 1 tablet (2.5 mg total) by mouth daily.   NON FORMULARY CPAP therapy   Saccharomyces boulardii (PROBIOTIC) 250 MG CAPS Take 250 mg by mouth  daily.   spironolactone  (ALDACTONE ) 25 MG tablet Take 0.5 tablets (12.5 mg total) by mouth daily.   tamsulosin (FLOMAX) 0.4 MG CAPS capsule Take 0.4 mg by mouth daily.   "

## 2024-04-04 NOTE — Telephone Encounter (Signed)
 Mario Wilbert SAUNDERS, MD  P Cv Div Sleep Studies Please order new supplies   All supplies ordered through [facility]

## 2024-04-07 NOTE — Addendum Note (Signed)
 Addended by: GRETEL MAEOLA CROME on: 04/07/2024 05:58 PM   Modules accepted: Orders

## 2024-04-08 ENCOUNTER — Ambulatory Visit: Attending: Cardiology

## 2024-04-08 DIAGNOSIS — I480 Paroxysmal atrial fibrillation: Secondary | ICD-10-CM

## 2024-04-08 NOTE — Progress Notes (Unsigned)
 Enrolled for [employer] to mail a ZIO XT long term holter monitor to the patients address on file.

## 2024-04-10 ENCOUNTER — Ambulatory Visit: Admitting: Cardiology

## 2024-04-21 ENCOUNTER — Other Ambulatory Visit: Payer: Self-pay

## 2024-04-28 MED ORDER — SPIRONOLACTONE 25 MG PO TABS: 12.5000 mg | ORAL_TABLET | Freq: Every day | ORAL | 1 refills | Status: AC

## 2024-04-28 NOTE — Telephone Encounter (Signed)
 Labs outside of Normal  In accordance with refill protocols, please review and address the following requirements before this medication refill can be authorized:  Labs

## 2024-05-08 ENCOUNTER — Other Ambulatory Visit: Payer: Self-pay | Admitting: Cardiovascular Disease

## 2024-05-08 DIAGNOSIS — I480 Paroxysmal atrial fibrillation: Secondary | ICD-10-CM

## 2024-07-29 ENCOUNTER — Ambulatory Visit: Payer: Self-pay | Admitting: Nurse Practitioner

## 2025-02-25 ENCOUNTER — Ambulatory Visit: Payer: Self-pay | Admitting: Nurse Practitioner

## 2025-02-25 ENCOUNTER — Ambulatory Visit: Payer: Self-pay
